# Patient Record
Sex: Male | Born: 1945
Health system: Southern US, Community
[De-identification: ages and names within clinical notes are randomized; demographics above are authoritative.]

## PROBLEM LIST (undated history)

## (undated) DIAGNOSIS — J84112 Idiopathic pulmonary fibrosis: Secondary | ICD-10-CM

## (undated) DIAGNOSIS — R739 Hyperglycemia, unspecified: Secondary | ICD-10-CM

## (undated) DIAGNOSIS — J849 Interstitial pulmonary disease, unspecified: Secondary | ICD-10-CM

## (undated) DIAGNOSIS — J8410 Pulmonary fibrosis, unspecified: Secondary | ICD-10-CM

## (undated) DIAGNOSIS — K219 Gastro-esophageal reflux disease without esophagitis: Secondary | ICD-10-CM

## (undated) DIAGNOSIS — I10 Essential (primary) hypertension: Secondary | ICD-10-CM

## (undated) DIAGNOSIS — I2723 Pulmonary hypertension due to lung diseases and hypoxia: Secondary | ICD-10-CM

## (undated) HISTORY — DX: Pulmonary fibrosis, unspecified: J84.10

## (undated) HISTORY — DX: Idiopathic pulmonary fibrosis: J84.112

## (undated) HISTORY — DX: Pulmonary hypertension due to lung diseases and hypoxia: I27.23

## (undated) HISTORY — PX: HERNIA REPAIR: SHX51

## (undated) HISTORY — DX: Gastro-esophageal reflux disease without esophagitis: K21.9

## (undated) HISTORY — PX: EYE SURGERY: SHX253

## (undated) HISTORY — DX: Interstitial pulmonary disease, unspecified: J84.9

---

## 2016-05-03 ENCOUNTER — Emergency Department (HOSPITAL_COMMUNITY)
Admission: EM | Admit: 2016-05-03 | Discharge: 2016-05-03 | Disposition: A | Discharge status: Home / Self Care | Payer: Self-pay | Attending: Emergency Medicine | Admitting: Emergency Medicine

## 2016-05-03 ENCOUNTER — Encounter (HOSPITAL_COMMUNITY): Payer: Self-pay

## 2016-05-03 ENCOUNTER — Emergency Department (HOSPITAL_COMMUNITY): Payer: Self-pay

## 2016-05-03 DIAGNOSIS — R109 Unspecified abdominal pain: Secondary | ICD-10-CM | POA: Insufficient documentation

## 2016-05-03 DIAGNOSIS — Z87891 Personal history of nicotine dependence: Secondary | ICD-10-CM | POA: Insufficient documentation

## 2016-05-03 DIAGNOSIS — I10 Essential (primary) hypertension: Secondary | ICD-10-CM | POA: Insufficient documentation

## 2016-05-03 HISTORY — DX: Essential (primary) hypertension: I10

## 2016-05-03 LAB — COMPREHENSIVE METABOLIC PANEL
ALBUMIN: 3.6 g/dL (ref 3.5–5.0)
ALT: 11 U/L — ABNORMAL LOW (ref 17–63)
ANION GAP: 11 (ref 5–15)
AST: 19 U/L (ref 15–41)
Alkaline Phosphatase: 64 U/L (ref 38–126)
BILIRUBIN TOTAL: 0.7 mg/dL (ref 0.3–1.2)
BUN: 11 mg/dL (ref 6–20)
CO2: 30 mmol/L (ref 22–32)
Calcium: 9.7 mg/dL (ref 8.9–10.3)
Chloride: 97 mmol/L — ABNORMAL LOW (ref 101–111)
Creatinine, Ser: 0.99 mg/dL (ref 0.61–1.24)
GFR calc non Af Amer: >60 mL/min (ref >60)
Glucose, Bld: 130 mg/dL — ABNORMAL HIGH (ref 65–99)
POTASSIUM: 4.6 mmol/L (ref 3.5–5.1)
Sodium: 138 mmol/L (ref 135–145)
TOTAL PROTEIN: 8.3 g/dL — AB (ref 6.5–8.1)

## 2016-05-03 LAB — CBC
HEMATOCRIT: 42.3 % (ref 39.0–52.0)
HEMOGLOBIN: 14.3 g/dL (ref 13.0–17.0)
MCH: 31 pg (ref 26.0–34.0)
MCHC: 33.8 g/dL (ref 30.0–36.0)
MCV: 91.8 fL (ref 78.0–100.0)
Platelets: 317 10*3/uL (ref 150–400)
RBC: 4.61 MIL/uL (ref 4.22–5.81)
RDW: 13.2 % (ref 11.5–15.5)
WBC: 7.3 10*3/uL (ref 4.0–10.5)

## 2016-05-03 LAB — URINALYSIS, ROUTINE W REFLEX MICROSCOPIC
Bilirubin Urine: NEGATIVE
Glucose, UA: NEGATIVE mg/dL
Hgb urine dipstick: NEGATIVE
Ketones, ur: NEGATIVE mg/dL
LEUKOCYTES UA: NEGATIVE
NITRITE: NEGATIVE
PH: 7 (ref 5.0–8.0)
Protein, ur: NEGATIVE mg/dL
SPECIFIC GRAVITY, URINE: 1.017 (ref 1.005–1.030)

## 2016-05-03 LAB — LIPASE, BLOOD: Lipase: 14 U/L (ref 11–51)

## 2016-05-03 MED ORDER — HYDROCODONE-ACETAMINOPHEN 5-325 MG PO TABS: 1.0000 | ORAL_TABLET | Freq: Four times a day (QID) | ORAL | 0 refills | Status: DC | PRN

## 2016-05-03 MED ORDER — BENZONATATE 100 MG PO CAPS: 200.0000 mg | ORAL_CAPSULE | Freq: Two times a day (BID) | ORAL | 0 refills | Status: DC | PRN

## 2016-05-03 MED ORDER — CYCLOBENZAPRINE HCL 10 MG PO TABS: 10.0000 mg | ORAL_TABLET | Freq: Two times a day (BID) | ORAL | 0 refills | Status: DC | PRN

## 2016-05-03 NOTE — ED Provider Notes (Signed)
[facility] DEPT Provider Note   CSN: [id number] Arrival date & time: 05/03/16  1058     History   Chief Complaint Chief Complaint  Patient presents with  . Flank Pain    HPI [name] is a 71 y.o. male.  Patient presents to the ED with a chief complaint of left side pain.  Symptoms started on 8-10 days ago.  States that symptoms are worsened with palpation.  Denies fever, new cough, abdominal pain, dysuria.   Has been taking azithromycin for cough.  Denies any improvement of his symptoms.  Is scheduled to see pulmonology for fibrosis in 10 days.     The history is provided by the patient. The history is limited by a language barrier. A language interpreter was used.    Past Medical History:  Diagnosis Date  . Hypertension     There are no active problems to display for this patient.   Past Surgical History:  Procedure Laterality Date  . EYE SURGERY    . HERNIA REPAIR         Home Medications    Prior to Admission medications   Not on File    Family History No family history on file.  Social History Social History  Substance Use Topics  . Smoking status: Former Smoker    Quit date: 05/03/2001  . Smokeless tobacco: Never Used  . Alcohol use No     Allergies   Patient has no allergy information on record.   Review of Systems Review of Systems  All other systems reviewed and are negative.    Physical Exam Updated Vital Signs BP 112/75   Pulse 88   Temp 97.6 F (36.4 C) (Oral)   Resp 18   SpO2 99%   Physical Exam  Constitutional: He is oriented to person, place, and time. He appears well-developed and well-nourished.  HENT:  Head: Normocephalic and atraumatic.  Eyes: Conjunctivae and EOM are normal. Pupils are equal, round, and reactive to light. Right eye exhibits no discharge. Left eye exhibits no discharge. No scleral icterus.  Neck: Normal range of motion. Neck supple. No JVD present.  Cardiovascular: Normal rate, regular  rhythm and normal heart sounds.  Exam reveals no gallop and no friction rub.   No murmur heard. Pulmonary/Chest: Effort normal and breath sounds normal. No respiratory distress. He has no wheezes. He has no rales. He exhibits no tenderness.  Abdominal: Soft. He exhibits no distension and no mass. There is no tenderness. There is no rebound and no guarding.  No focal abdominal tenderness, no RLQ tenderness or pain at McBurney's point, no RUQ tenderness or Murphy's sign, no left-sided abdominal tenderness, no fluid wave, or signs of peritonitis   Musculoskeletal: Normal range of motion. He exhibits no edema or tenderness.  Left side is tender to palpation  Neurological: He is alert and oriented to person, place, and time.  Skin: Skin is warm and dry.  No rash or bruises  Psychiatric: He has a normal mood and affect. His behavior is normal. Judgment and thought content normal.  Nursing note and vitals reviewed.    ED Treatments / Results  Labs (all labs ordered are listed, but only abnormal results are displayed) Labs Reviewed  COMPREHENSIVE METABOLIC PANEL - Abnormal; Notable for the following:       Result Value   Chloride 97 (*)    Glucose, Bld 130 (*)    Total Protein 8.3 (*)    ALT 11 (*)  All other components within normal limits  LIPASE, BLOOD  CBC  URINALYSIS, ROUTINE W REFLEX MICROSCOPIC    EKG  EKG Interpretation None       Radiology Dg Chest 2 View  Result Date: 05/03/2016 CLINICAL DATA:  Cough EXAM: CHEST  2 VIEW COMPARISON:  None. FINDINGS: Chronic interstitial markings markings/fibrosis with bronchiectasis and subpleural reticulation in the lungs bilaterally. While nonspecific, idiopathic pulmonary fibrosis is possible. No superimposed opacities suspicious for pneumonia. No pleural effusion or pneumothorax. The heart is normal in size. Mild degenerative changes of the visualized thoracolumbar spine. Mild lower thoracic kyphotic deformity with associated mild  superior endplate changes with loss of height at L1. IMPRESSION: Chronic interstitial markings/fibrosis, nonspecific, but suggesting chronic interstitial lung disease such as idiopathic pulmonary fibrosis. No superimposed opacities suspicious for pneumonia. Electronically Signed   By: Julian Hy M.D.   On: 05/03/2016 12:38   Ct Renal Stone Study  Result Date: 05/03/2016 CLINICAL DATA:  LEFT lower quadrant pain radiating to LEFT flank for 8 days. EXAM: CT ABDOMEN AND PELVIS WITHOUT CONTRAST TECHNIQUE: Multidetector CT imaging of the abdomen and pelvis was performed following the standard protocol without IV contrast. COMPARISON:  None. FINDINGS: Moderate respiratory motion degraded examination. LOWER CHEST: Fibrotic changes lung bases without focal consolidation. Component honeycombing apparent. Heart size is normal. Subcentimeter lymph node adjacent to the thoracic esophagus. No pericardial effusions. The visualized heart size is normal. No pericardial effusion. HEPATOBILIARY: Normal. PANCREAS: Normal. SPLEEN: Normal. ADRENALS/URINARY TRACT: Kidneys are orthotopic, demonstrating normal size and morphology. No nephrolithiasis, hydronephrosis ; please note, degree of respiratory motion limits detection of small nonobstructing nephrolithiasis. Limited assessment for renal masses on this nonenhanced examination. The unopacified ureters are normal in course and caliber. Urinary bladder is partially distended and unremarkable. Normal adrenal glands. STOMACH/BOWEL: The stomach, small and large bowel are normal in course and caliber without inflammatory changes, sensitivity decreased by lack of enteric contrast. Stooling gas distended rectum. Normal appendix. VASCULAR/LYMPHATIC: 2.5 cm ectatic infrarenal aorta. Mild calcific atherosclerosis. No lymphadenopathy by CT size criteria. REPRODUCTIVE: Mild prostatomegaly involving base of the bladder. OTHER: No intraperitoneal free fluid or free air. MUSCULOSKELETAL:  Non-acute. Bridging sacroiliac osteophytes. Old LEFT L2 transverse process fracture. Old mild T12 compression fracture. IMPRESSION: No urolithiasis, obstructive uropathy nor acute intra-abdominal/pelvic process on this moderately motion degraded examination. Included chest demonstrates fibrosis and honeycombing associated with chronic interstitial lung disease. Recommend CT chest without contrast on a nonemergent basis. Electronically Signed   By: Elon Alas M.D.   On: 05/03/2016 13:53    Procedures Procedures (including critical care time)  Medications Ordered in ED Medications - No data to display   Initial Impression / Assessment and Plan / ED Course  I have reviewed the triage vital signs and the nursing notes.  Pertinent labs & imaging results that were available during my care of the patient were reviewed by me and considered in my medical decision making (see chart for details).     Patient with left side pain x a little over a week.  No fever.  No focal abdominal pain.  No new cough.  No evidence of superimposed infection on CXR.  Pain is very reproducible, and is worsened with flexion of abdominal wall.  Seems muscular.  Patient seen by and discussed with Dr. Gilford Raid, who agrees.  Will check CT imaging to rule out other causes, but suspect muscular strain.  CT is negative for acute findings.  Patient has follow-up regarding pulmonary fibrosis in 9 days.  Return precautions given.  Patient is stable and ready for discharge.  Final Clinical Impressions(s) / ED Diagnoses   Final diagnoses:  Flank pain    New Prescriptions New Prescriptions   BENZONATATE (TESSALON) 100 MG CAPSULE    Take 2 capsules (200 mg total) by mouth 2 (two) times daily as needed for cough.   CYCLOBENZAPRINE (FLEXERIL) 10 MG TABLET    Take 1 tablet (10 mg total) by mouth 2 (two) times daily as needed for muscle spasms.   HYDROCODONE-ACETAMINOPHEN (NORCO/VICODIN) 5-325 MG TABLET    Take 1-2 tablets  by mouth every 6 (six) hours as needed.     Montine Circle, PA-C 05/03/16 1421    Isla Pence, MD 05/03/16 1426

## 2016-05-03 NOTE — ED Triage Notes (Signed)
Pt reports LLQ abdominal pain that radiates to left flank area that started 8-10 days ago. Denies abnormal urinary symptoms. Pt just finished taking Azithromycin for "for infection where his pain is"  but has not felt better.

## 2016-05-03 NOTE — ED Notes (Signed)
ED Provider at bedside. 

## 2016-05-12 ENCOUNTER — Encounter: Payer: Self-pay | Admitting: Internal Medicine

## 2016-05-12 ENCOUNTER — Ambulatory Visit (INDEPENDENT_AMBULATORY_CARE_PROVIDER_SITE_OTHER): Payer: Self-pay | Admitting: Internal Medicine

## 2016-05-12 ENCOUNTER — Other Ambulatory Visit (INDEPENDENT_AMBULATORY_CARE_PROVIDER_SITE_OTHER): Payer: Self-pay

## 2016-05-12 DIAGNOSIS — R0789 Other chest pain: Secondary | ICD-10-CM

## 2016-05-12 DIAGNOSIS — R634 Abnormal weight loss: Secondary | ICD-10-CM

## 2016-05-12 DIAGNOSIS — R079 Chest pain, unspecified: Secondary | ICD-10-CM | POA: Insufficient documentation

## 2016-05-12 DIAGNOSIS — R0600 Dyspnea, unspecified: Secondary | ICD-10-CM

## 2016-05-12 DIAGNOSIS — R000 Tachycardia, unspecified: Secondary | ICD-10-CM | POA: Insufficient documentation

## 2016-05-12 DIAGNOSIS — J849 Interstitial pulmonary disease, unspecified: Secondary | ICD-10-CM

## 2016-05-12 DIAGNOSIS — R0602 Shortness of breath: Secondary | ICD-10-CM | POA: Insufficient documentation

## 2016-05-12 LAB — BASIC METABOLIC PANEL
BUN: 23 mg/dL (ref 6–23)
CO2: 31 meq/L (ref 19–32)
Calcium: 10.2 mg/dL (ref 8.4–10.5)
Chloride: 91 mEq/L — ABNORMAL LOW (ref 96–112)
Creatinine, Ser: 1.02 mg/dL (ref 0.40–1.50)
GFR: 76.86 mL/min (ref >60.00)
Glucose, Bld: 99 mg/dL (ref 70–99)
Potassium: 4.8 mEq/L (ref 3.5–5.1)
Sodium: 130 mEq/L — ABNORMAL LOW (ref 135–145)

## 2016-05-12 LAB — CARDIAC PANEL
CK TOTAL: 55 U/L (ref 7–232)
CK-MB: 1.2 ng/mL (ref 0.3–4.0)
Relative Index: 2.2 calc (ref 0.0–2.5)

## 2016-05-12 LAB — TROPONIN I: TNIDX: 0.01 ug/l (ref 0.00–0.06)

## 2016-05-12 LAB — SEDIMENTATION RATE: SED RATE: 95 mm/h — AB (ref 0–20)

## 2016-05-12 MED ORDER — PREDNISONE 10 MG PO TABS: ORAL_TABLET | ORAL | 0 refills | Status: DC

## 2016-05-12 MED ORDER — FLUTICASONE-UMECLIDIN-VILANT 100-62.5-25 MCG/INH IN AEPB: 1.0000 | INHALATION_SPRAY | Freq: Every day | RESPIRATORY_TRACT | 11 refills | Status: DC

## 2016-05-12 NOTE — Assessment & Plan Note (Signed)
Heart rate got very fast without you dropping oxygen. This is when you got short of breath and stopped during our walk test  Plan Get echo  Followup Next 2 weeks but after completing of all of above Return to see me or an APP

## 2016-05-12 NOTE — Progress Notes (Signed)
Subjective:    Patient ID: [name], male    DOB: Oct 16, 1945, 71 y.o.   MRN: [id number]   PCP No PCP Per Patient  HPI    IOV 05/12/2016  Chief Complaint  Patient presents with  . Pulmonary Consult    SOB more lately, doctor in [facility] gave pt [place] and he does take it everyday but no reflief    71 year old [place]. He has been living and [place] with his daughters Lenell Antu and Hallam since 2004. 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 He is been referred for interstitial lung disease.  History according to the [employer] of chest physicians interstitial lung disease questionnaire - filled in by daughters . PAtint speaks no english. Daughters limited english  He reports a two-month history of cough that is that on most days. It is severe and it interferes with his activity. It is a dry cough in quality. There is associated shortness of breath that is at least level II which is a gets short of breath when hurrying on level ground or walking up a slight hill. Symptoms are associated with 30 pound weight loss over the last 2-4 months. Also with dysphagia and heartburn and dry mouth. But he denies any rash or edema or sensitivity to light and wheezing or oral ulcers or chest pain [other than some left infra-axillary pain]   In terms of personal exposure history: He denies any street drug use. He used to smoke cigarettes started smoking at age 69 smoked until age56;  40 cigarettes per day.   Family history: He denies any emphysema, COPD asthma, sarcoidosis, cystic fibrosis, pulmonary fibrosis, hypersensitivity pneumonitis. He does not live in a old house. 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 They deny any work in a minor quarry  are all [employer] or a foundry   Pulmonary toxicity history: He denies. No radiation exposure.  He presented to urgent care and at [facility] 03/07/2016:I do not have the images with me. But I reviewed the chart. His creatinine was 1.0 mg percent. His CT is reported as severe interstitial fibrosis with peripheral honeycombing. No pleural or pericardial effusion. This is the wall distal esophagus and esophagitis. Negative upper abdomen. He did get IV contrast. No mass noted  Walking desaturation test 185 feet 3 laps on room air:   He walked only 1 lap and stopped due to shortness of breath. Did not desaturate. HR 131/min. Denied chest pain     has a past medical history of GERD (gastroesophageal reflux disease); Hypertension; and Pulmonary fibrosis ([place]).   reports that he quit smoking about 15 years ago. He has a 80.00 pack-year smoking history. He has never used smokeless tobacco.  Past Surgical History:  Procedure Laterality Date  . EYE SURGERY    . HERNIA REPAIR      Not on File   There is no immunization history on file for this patient.  No family history on file.   Current Outpatient Prescriptions:  .  baclofen (LIORESAL) 10 MG tablet, Take 10 mg by mouth 3 (three) times daily., Disp: , Rfl:  .  benzonatate (TESSALON) 100 MG capsule, Take 2 capsules (200 mg total) by mouth 2 (two) times daily as needed for cough., Disp: 20  capsule, Rfl: 0 .  Glycopyrrolate-Formoterol (BEVESPI AEROSPHERE) 9-4.8 MCG/ACT AERO, Inhale into the lungs., Disp: , Rfl:  .  HYDROcodone-acetaminophen (NORCO/VICODIN) 5-325 MG tablet, Take 1-2 tablets by mouth every 6 (six) hours as needed., Disp: 10 tablet, Rfl: 0 .  mirtazapine (REMERON) 15 MG tablet, Take 15 mg by mouth at bedtime., Disp: , Rfl:    Review of Systems  Constitutional: Negative for fever and unexpected weight change.  HENT: Negative for congestion, dental problem, ear pain, nosebleeds, postnasal drip, rhinorrhea, sinus pressure,  sneezing, sore throat and trouble swallowing.   Eyes: Negative for redness and itching.  Respiratory: Positive for cough and shortness of breath. Negative for chest tightness and wheezing.   Cardiovascular: Negative for palpitations and leg swelling.  Gastrointestinal: Negative for nausea and vomiting.  Genitourinary: Negative for dysuria.  Musculoskeletal: Negative for joint swelling.  Skin: Negative for rash.  Neurological: Negative for headaches.  Hematological: Does not bruise/bleed easily.  Psychiatric/Behavioral: Negative for dysphoric mood. The patient is not nervous/anxious.        Objective:   Physical Exam  Constitutional: He is oriented to person, place, and time. He appears well-developed and well-nourished. No distress.  lean  HENT:  Head: Normocephalic and atraumatic.  Right Ear: External ear normal.  Left Ear: External ear normal.  Mouth/Throat: Oropharynx is clear and moist. No oropharyngeal exudate.  Eyes: Conjunctivae and EOM are normal. Pupils are equal, round, and reactive to light. Right eye exhibits no discharge. Left eye exhibits no discharge. No scleral icterus.  Neck: Normal range of motion. Neck supple. No JVD present. No tracheal deviation present. No thyromegaly present.  Cardiovascular: Normal rate, regular rhythm and intact distal pulses.  Exam reveals no gallop and no friction rub.   No murmur heard. Pulmonary/Chest: Effort normal and breath sounds normal. No respiratory distress. He has no wheezes. He has no rales. He exhibits no tenderness.  tachypneic Some crackles No wheeze Mild acc muscle use  Abdominal: Soft. Bowel sounds are normal. He exhibits no distension and no mass. There is no tenderness. There is no rebound and no guarding.  Musculoskeletal: Normal range of motion. He exhibits no edema or tenderness.  Lymphadenopathy:    He has no cervical adenopathy.  Neurological: He is alert and oriented to person, place, and time. He has normal  reflexes. No cranial nerve deficit. Coordination normal.  Skin: Skin is warm and dry. No rash noted. He is not diaphoretic. No erythema. No pallor.  Psychiatric: He has a normal mood and affect. His behavior is normal. Judgment and thought content normal.  Nursing note and vitals reviewed.   Vitals:   05/12/16 1612  BP: 104/70  Pulse: (!) 118  SpO2: 96%  Weight: 109 lb (49.4 kg)    There is no height or weight on file to calculate BMI.        Assessment & Plan:  /ILD (interstitial lung disease) ([place])  - I am concerned you have Interstitial Lung Disease (ILD)  -  There are > 100 varieties of this - To narrow down possibilities and assess severity please do the following tests  - do Pre-bd spiro and dlco only. No lung volume or bd response.   - do overnight oxygen test on room air  - do High Resolution CT chest wo contrast (only Dr Rosario Jacks or Dr Weber Cooks or dr Polly Cobia to read)  - do autoimmune panel: Serum: ESR, ANA, DS-DNA, RF, anti-CCP, ssA, ssB, scl-70, ANCA, MPO and PR-3 antibodies, Total CK,  Aldolase,  Hypersensitivity Pneumonitis Panel   Weight loss, abnormal Not sure if this is related to pulmonary fibrosis Will see what workup shows and then decide next step  Atypical chest pain Not sure why you have this Get echo Let us see what other workup shos  Tachycardia Heart rate got very fast without you dropping oxygen. This is when you got short of breath and stopped during our walk test  Plan Get echo  Followup Next 2 weeks but after completing of all of above Return to see me or an APP  Dyspnea No real quick fix for shortness of breath without going through workup esp if BEVESPI did not work  Plan Go to ER if there is crisis For now try TRELEGY inhaler sample Try Take prednisone 40 mg daily x 2 days, then 72m daily x 2 days, then 136mdaily x 2 days, then 16m62maily x 2 days and stop     Off note: At first only his daughters were present. Later after going  through all the instructions of niece showed up who spoke fluent EngVanuatuterpreter for the family. During the course of creating the plan, express constant frustration. Initially they did not feel satisfied with the plan to work of interstitial lung disease and rule out cardiomyopathy with an echocardiogram. They felt that dyspnea needed to be addressed with the help of her medication. At this point in time the greater plan of giving empiric prednisone and a triple inhaler therapy made by [employer] (Bevspi did not help). And it seems satisfied. Later they felt that they would want to take all the outpatient lab work to the emergency room and have the emergency room performed all the tests. I did explain to them that the process did not look like this. I also exposed to them that there is any concern of deterioration that should straightaway go to the emergency room at any point in time. I did explain to them that we're working out for chronic problems but any acute decompensation at any time that should just go to the emergency room.because of frustration of the family I did add troponin and d-dimer check and chemistry panel to the blood work   Dr. MurBrand Males.D., F.CWayne County HospitalP Pulmonary and Critical Care Medicine Staff Physician [facility] Pager: [phone] no answer or between  15:00h - 7:00h: call [phone]  05/12/2016 4:51 PM

## 2016-05-12 NOTE — Assessment & Plan Note (Signed)
Not sure why you have this Get echo Let us see what other workup shos

## 2016-05-12 NOTE — Assessment & Plan Note (Signed)
Not sure if this is related to pulmonary fibrosis Will see what workup shows and then decide next step

## 2016-05-12 NOTE — Assessment & Plan Note (Signed)
-  I am concerned you have Interstitial Lung Disease (ILD)  -  There are > 100 varieties of this - To narrow down possibilities and assess severity please do the following tests  - do Pre-bd spiro and dlco only. No lung volume or bd response.   - do overnight oxygen test on room air  - do High Resolution CT chest wo contrast (only Dr Rosario Jacks or Dr Weber Cooks or dr Polly Cobia to read)  - do autoimmune panel: Serum: ESR, ANA, DS-DNA, RF, anti-CCP, ssA, ssB, scl-70, ANCA, MPO and PR-3 antibodies, Total CK,  Aldolase,  Hypersensitivity Pneumonitis Panel

## 2016-05-12 NOTE — Assessment & Plan Note (Signed)
No real quick fix for shortness of breath without going through workup esp if BEVESPI did not work  Plan Go to ER if there is crisis For now try TRELEGY inhaler sample Try Take prednisone 40 mg daily x 2 days, then 61m daily x 2 days, then 148mdaily x 2 days, then 29m13maily x 2 days and stop

## 2016-05-12 NOTE — Patient Instructions (Addendum)
ILD (interstitial lung disease) ([place])  - I am concerned you have Interstitial Lung Disease (ILD)  -  There are > 100 varieties of this - To narrow down possibilities and assess severity please do the following tests  - do Pre-bd spiro and dlco only. No lung volume or bd response.   - do overnight oxygen test on room air  - do High Resolution CT chest wo contrast (only Dr Rosario Jacks or Dr Weber Cooks or dr Polly Cobia to read)  - do autoimmune panel: Serum: ESR, ANA, DS-DNA, RF, anti-CCP, ssA, ssB, scl-70, ANCA, MPO and PR-3 antibodies, Total CK,  Aldolase,  Hypersensitivity Pneumonitis Panel   Weight loss, abnormal Not sure if this is related to pulmonary fibrosis Will see what workup shows and then decide next step  Atypical chest pain Not sure why you have this Get echo Let us see what other workup shos  Tachycardia Heart rate got very fast without you dropping oxygen. This is when you got short of breath and stopped during our walk test  Plan Get echo  Followup Next 2 weeks but after completing of all of above Return to see me or an APP  Dyspnea No real quick fix for shortness of breath without going through workup esp if BEVESPI did not work  Plan Go to ER if there is crisis For now try TRELEGY inhaler sample Try Take prednisone 40 mg daily x 2 days, then 77m daily x 2 days, then 180mdaily x 2 days, then 25m77maily x 2 days and stop

## 2016-05-13 ENCOUNTER — Inpatient Hospital Stay (HOSPITAL_COMMUNITY)
Admission: EM | Admit: 2016-05-13 | Discharge: 2016-05-17 | DRG: 196 | Disposition: A | Discharge status: Home / Self Care | Payer: Medicaid Other | Attending: Family Medicine | Admitting: Family Medicine

## 2016-05-13 ENCOUNTER — Encounter (HOSPITAL_COMMUNITY): Payer: Self-pay | Admitting: Nurse Practitioner

## 2016-05-13 ENCOUNTER — Emergency Department (HOSPITAL_COMMUNITY): Payer: Medicaid Other

## 2016-05-13 DIAGNOSIS — T380X5A Adverse effect of glucocorticoids and synthetic analogues, initial encounter: Secondary | ICD-10-CM | POA: Diagnosis present

## 2016-05-13 DIAGNOSIS — K219 Gastro-esophageal reflux disease without esophagitis: Secondary | ICD-10-CM | POA: Diagnosis present

## 2016-05-13 DIAGNOSIS — Z9981 Dependence on supplemental oxygen: Secondary | ICD-10-CM

## 2016-05-13 DIAGNOSIS — D638 Anemia in other chronic diseases classified elsewhere: Secondary | ICD-10-CM | POA: Diagnosis present

## 2016-05-13 DIAGNOSIS — E43 Unspecified severe protein-calorie malnutrition: Secondary | ICD-10-CM | POA: Insufficient documentation

## 2016-05-13 DIAGNOSIS — J189 Pneumonia, unspecified organism: Secondary | ICD-10-CM

## 2016-05-13 DIAGNOSIS — J849 Interstitial pulmonary disease, unspecified: Secondary | ICD-10-CM | POA: Diagnosis present

## 2016-05-13 DIAGNOSIS — R000 Tachycardia, unspecified: Secondary | ICD-10-CM | POA: Diagnosis present

## 2016-05-13 DIAGNOSIS — Z87891 Personal history of nicotine dependence: Secondary | ICD-10-CM

## 2016-05-13 DIAGNOSIS — J982 Interstitial emphysema: Secondary | ICD-10-CM

## 2016-05-13 DIAGNOSIS — I10 Essential (primary) hypertension: Secondary | ICD-10-CM | POA: Diagnosis present

## 2016-05-13 DIAGNOSIS — J8489 Other specified interstitial pulmonary diseases: Principal | ICD-10-CM | POA: Diagnosis present

## 2016-05-13 DIAGNOSIS — J984 Other disorders of lung: Secondary | ICD-10-CM

## 2016-05-13 DIAGNOSIS — R071 Chest pain on breathing: Secondary | ICD-10-CM

## 2016-05-13 DIAGNOSIS — R079 Chest pain, unspecified: Secondary | ICD-10-CM

## 2016-05-13 DIAGNOSIS — R739 Hyperglycemia, unspecified: Secondary | ICD-10-CM | POA: Diagnosis present

## 2016-05-13 DIAGNOSIS — Z681 Body mass index (BMI) 19 or less, adult: Secondary | ICD-10-CM

## 2016-05-13 DIAGNOSIS — R0789 Other chest pain: Secondary | ICD-10-CM | POA: Diagnosis present

## 2016-05-13 DIAGNOSIS — J9601 Acute respiratory failure with hypoxia: Secondary | ICD-10-CM | POA: Diagnosis present

## 2016-05-13 DIAGNOSIS — Z79899 Other long term (current) drug therapy: Secondary | ICD-10-CM

## 2016-05-13 LAB — BASIC METABOLIC PANEL
Anion gap: 11 (ref 5–15)
BUN: 14 mg/dL (ref 6–20)
CHLORIDE: 96 mmol/L — AB (ref 101–111)
CO2: 23 mmol/L (ref 22–32)
CREATININE: 0.98 mg/dL (ref 0.61–1.24)
Calcium: 9 mg/dL (ref 8.9–10.3)
GFR calc non Af Amer: >60 mL/min (ref >60)
GLUCOSE: 123 mg/dL — AB (ref 65–99)
Potassium: 4.4 mmol/L (ref 3.5–5.1)
Sodium: 130 mmol/L — ABNORMAL LOW (ref 135–145)

## 2016-05-13 LAB — I-STAT ARTERIAL BLOOD GAS, ED
Bicarbonate: 25.2 mmol/L (ref 20.0–28.0)
O2 SAT: 95 %
PCO2 ART: 42.3 mmHg (ref 32.0–48.0)
TCO2: 26 mmol/L (ref 0–100)
pH, Arterial: 7.383 (ref 7.350–7.450)
pO2, Arterial: 75 mmHg — ABNORMAL LOW (ref 83.0–108.0)

## 2016-05-13 LAB — CBC
HEMATOCRIT: 41 % (ref 39.0–52.0)
Hemoglobin: 14 g/dL (ref 13.0–17.0)
MCH: 30.9 pg (ref 26.0–34.0)
MCHC: 34.1 g/dL (ref 30.0–36.0)
MCV: 90.5 fL (ref 78.0–100.0)
Platelets: 329 10*3/uL (ref 150–400)
RBC: 4.53 MIL/uL (ref 4.22–5.81)
RDW: 12.7 % (ref 11.5–15.5)
WBC: 6.9 10*3/uL (ref 4.0–10.5)

## 2016-05-13 LAB — BRAIN NATRIURETIC PEPTIDE: B NATRIURETIC PEPTIDE 5: 7.5 pg/mL (ref 0.0–100.0)

## 2016-05-13 LAB — I-STAT TROPONIN, ED: TROPONIN I, POC: 0 ng/mL (ref 0.00–0.08)

## 2016-05-13 MED ORDER — ENOXAPARIN SODIUM 40 MG/0.4ML ~~LOC~~ SOLN
40.0000 mg | SUBCUTANEOUS | Status: DC
  Administered 2016-05-13 – 2016-05-16 (×4): 40 mg via SUBCUTANEOUS
  Filled 2016-05-13 (×4): qty 0.4

## 2016-05-13 MED ORDER — IPRATROPIUM-ALBUTEROL 0.5-2.5 (3) MG/3ML IN SOLN
3.0000 mL | Freq: Four times a day (QID) | RESPIRATORY_TRACT | Status: DC
  Administered 2016-05-14: 3 mL via RESPIRATORY_TRACT
  Filled 2016-05-13: qty 3

## 2016-05-13 MED ORDER — HYDROCODONE-ACETAMINOPHEN 5-325 MG PO TABS
1.0000 | ORAL_TABLET | Freq: Four times a day (QID) | ORAL | Status: DC | PRN
Start: 2016-05-13 — End: 2016-05-17
  Filled 2016-05-13: qty 1

## 2016-05-13 MED ORDER — ONDANSETRON HCL 4 MG/2ML IJ SOLN: 4.0000 mg | Freq: Four times a day (QID) | INTRAMUSCULAR | Status: DC | PRN

## 2016-05-13 MED ORDER — IPRATROPIUM-ALBUTEROL 0.5-2.5 (3) MG/3ML IN SOLN: 3.0000 mL | Freq: Four times a day (QID) | RESPIRATORY_TRACT | Status: DC

## 2016-05-13 MED ORDER — SODIUM CHLORIDE 0.9 % IV BOLUS (SEPSIS)
1000.0000 mL | Freq: Once | INTRAVENOUS | Status: AC
  Administered 2016-05-13: 1000 mL via INTRAVENOUS

## 2016-05-13 MED ORDER — IPRATROPIUM-ALBUTEROL 0.5-2.5 (3) MG/3ML IN SOLN
3.0000 mL | Freq: Once | RESPIRATORY_TRACT | Status: AC
  Administered 2016-05-13: 3 mL via RESPIRATORY_TRACT
  Filled 2016-05-13: qty 3

## 2016-05-13 MED ORDER — ACETAMINOPHEN 650 MG RE SUPP: 650.0000 mg | Freq: Four times a day (QID) | RECTAL | Status: DC | PRN

## 2016-05-13 MED ORDER — IOPAMIDOL (ISOVUE-370) INJECTION 76%
INTRAVENOUS | Status: AC
  Administered 2016-05-13: 100 mL
  Filled 2016-05-13: qty 100

## 2016-05-13 MED ORDER — METHYLPREDNISOLONE SODIUM SUCC 125 MG IJ SOLR
125.0000 mg | Freq: Once | INTRAMUSCULAR | Status: AC
  Administered 2016-05-13: 125 mg via INTRAVENOUS
  Filled 2016-05-13: qty 2

## 2016-05-13 MED ORDER — ONDANSETRON HCL 4 MG PO TABS: 4.0000 mg | ORAL_TABLET | Freq: Four times a day (QID) | ORAL | Status: DC | PRN

## 2016-05-13 MED ORDER — METHYLPREDNISOLONE SODIUM SUCC 125 MG IJ SOLR
60.0000 mg | Freq: Four times a day (QID) | INTRAMUSCULAR | Status: DC
  Administered 2016-05-13 – 2016-05-14 (×3): 60 mg via INTRAVENOUS
  Filled 2016-05-13 (×3): qty 2

## 2016-05-13 MED ORDER — SODIUM CHLORIDE 0.9 % IV SOLN
INTRAVENOUS | Status: DC
  Administered 2016-05-13: 23:00:00 via INTRAVENOUS
  Administered 2016-05-14: 1000 mL via INTRAVENOUS

## 2016-05-13 MED ORDER — ACETAMINOPHEN 325 MG PO TABS: 650.0000 mg | ORAL_TABLET | Freq: Four times a day (QID) | ORAL | Status: DC | PRN

## 2016-05-13 NOTE — ED Notes (Signed)
Patient transported to CT 

## 2016-05-13 NOTE — H&P (Signed)
TRH H&P    Patient Demographics:    [name], is a 71 y.o. male  MRN: [id number]  DOB - 01/14/1946  Admit Date - 05/13/2016  Referring MD/NP/PA: Dr. Ellender Hose  Outpatient Primary MD for the patient is No PCP Per Patient  Patient coming from: Home  Chief Complaint  Patient presents with  . Shortness of Breath      HPI:    [name]  is a 71 y.o. male, With history of pulmonary fibrosis,  hypertension came to hospital with worsening shortness of breath. Patient was recently seen by pulmonary as outpatient for worsening dyspnea. Lab work was done on 05/12/2016 for assessing the severity of interstitial lung disease. As per patient's son usually patient is able to walk around the house but over past 1 day he has not been able to walk at all and is getting short of breath when he tries to get out of bed. He also complains of sharp pleuritic left-sided chest pain that is worse with movement and and palpation. He denies nausea vomiting or diarrhea. He denies fever, no dysuria.  In the ED CTA was done which was negative for pulmonary embolism. It also showed small pneumomediastinum potentially due to rupture of the peripheral left sacral bleb. No evidence of fluid in the mediastinum. ED physician discussed with [facility], and they recommended breathing treatments, steroids and no further treatment for small pneumomediastinum.    Review of systems:      A full 10 point Review of Systems was done, except as stated above, all other Review of Systems were negative.   With Past History of the following :    Past Medical History:  Diagnosis Date  . GERD (gastroesophageal reflux disease)   . Hypertension   . Pulmonary fibrosis ([place])       Past Surgical History:  Procedure Laterality Date  . EYE SURGERY    . HERNIA REPAIR        Social History:      Social History  Substance Use Topics    . Smoking status: Former Smoker    Packs/day: 2.00    Years: 40.00    Quit date: 05/03/2001  . Smokeless tobacco: Never Used  . Alcohol use No       Family History :    History reviewed. No pertinent family history.    Home Medications:   Prior to Admission medications   Medication Sig Start Date End Date Taking? Authorizing Provider  Fluticasone-Umeclidin-Vilant (TRELEGY ELLIPTA) 100-62.5-25 MCG/INH AEPB Inhale 1 puff into the lungs daily. 05/12/16  Yes Brand Males, MD  Glycopyrrolate-Formoterol (BEVESPI AEROSPHERE) 9-4.8 MCG/ACT AERO Inhale 2 puffs into the lungs 2 (two) times daily.    Yes Historical Provider, MD  losartan (COZAAR) 25 MG tablet Take 25 mg by mouth daily.   Yes Historical Provider, MD  predniSONE (DELTASONE) 10 MG tablet 40 mg daily x 2 days, then 12m daily x 2 days, then 150mdaily x 2 days, then 25m36maily x 2 days and stop Patient taking differently: Take  5-10 mg by mouth See admin instructions. Tapered course started 05/13/16:  Take 1 tablet (10 mg) by mouth 4 times daily (every 6 hours) for 2 days, then take 1 tablet 2 times daily for 2 days, then take 1 tablet daily for 2 days, then take 1/2 tablet (5 mg) daily for 2 days, then stop 05/12/16  Yes Brand Males, MD  benzonatate (TESSALON) 100 MG capsule Take 2 capsules (200 mg total) by mouth 2 (two) times daily as needed for cough. Patient not taking: Reported on 05/13/2016 05/03/16   Montine Circle, PA-C  HYDROcodone-acetaminophen (NORCO/VICODIN) 5-325 MG tablet Take 1-2 tablets by mouth every 6 (six) hours as needed. Patient not taking: Reported on 05/13/2016 05/03/16   Montine Circle, PA-C     Allergies:     Allergies  Allergen Reactions  . Cyclobenzaprine Swelling    Tongue and lip swelling     Physical Exam:   Vitals  Blood pressure 98/79, pulse (!) 103, temperature 97.7 F (36.5 C), temperature source Axillary, resp. rate (!) 35, SpO2 100 %.  1.  General: Appears in no acute  distress  2. Psychiatric:  Intact judgement and  insight, awake alert, oriented x 3.  3. Neurologic: No focal neurological deficits, all cranial nerves intact.Strength 5/5 all 4 extremities, sensation intact all 4 extremities, plantars down going.  4. Eyes :  anicteric sclerae, moist conjunctivae with no lid lag. PERRLA.  5. ENMT:  Oropharynx clear with moist mucous membranes and good dentition  6. Neck:  supple, no cervical lymphadenopathy appriciated, No thyromegaly  7. Respiratory : Normal respiratory effort, bibasilar inspiratory crackles  8. Cardiovascular : RRR, no gallops, rubs or murmurs, no leg edema. Left-sided chest pain, left chest wall tenderness on palpation.  9. Gastrointestinal:  Positive bowel sounds, abdomen soft, non-tender to palpation,no hepatosplenomegaly, no rigidity or guarding       10. Skin:  No cyanosis, normal texture and turgor, no rash, lesions or ulcers  11.Musculoskeletal:  Good muscle tone,  joints appear normal , no effusions,  normal range of motion    Data Review:    CBC  Recent Labs Lab 05/13/16 1327  WBC 6.9  HGB 14.0  HCT 41.0  PLT 329  MCV 90.5  MCH 30.9  MCHC 34.1  RDW 12.7   ------------------------------------------------------------------------------------------------------------------  Chemistries   Recent Labs Lab 05/12/16 1726 05/13/16 1327  NA 130* 130*  K 4.8 4.4  CL 91* 96*  CO2 31 23  GLUCOSE 99 123*  BUN 23 14  CREATININE 1.02 0.98  CALCIUM 10.2 9.0   ------------------------------------------------------------------------------------------------------------------  ---------------------------------------------------------------------------------------Cardiac Enzymes:  Recent Labs Lab 05/12/16 1726  CKTOTAL 55  CKMB 1.2    --------------------------------------------------------------------------------------------------------------- Urine analysis:    Component Value Date/Time    COLORURINE YELLOW 05/03/2016 1129   APPEARANCEUR CLEAR 05/03/2016 1129   LABSPEC 1.017 05/03/2016 1129   PHURINE 7.0 05/03/2016 1129   GLUCOSEU NEGATIVE 05/03/2016 1129   HGBUR NEGATIVE 05/03/2016 1129   BILIRUBINUR NEGATIVE 05/03/2016 1129   KETONESUR NEGATIVE 05/03/2016 1129   PROTEINUR NEGATIVE 05/03/2016 1129   NITRITE NEGATIVE 05/03/2016 1129   LEUKOCYTESUR NEGATIVE 05/03/2016 1129      Imaging Results:    Ct Angio Chest Pe W Or Wo Contrast  Result Date: 05/13/2016 CLINICAL DATA:  Worsening shortness of breath. EXAM: CT ANGIOGRAPHY CHEST WITH CONTRAST TECHNIQUE: Multidetector CT imaging of the chest was performed using the standard protocol during bolus administration of intravenous contrast. Multiplanar CT image reconstructions and MIPs were obtained to evaluate  the vascular anatomy. CONTRAST:  80 cc Isovue 370 COMPARISON:  Chest x-ray 05/03/2016 FINDINGS: Cardiovascular: Heart size is upper normal. No pericardial effusion. No thoracic aortic aneurysm. No evidence for dissection of the thoracic aorta. No filling defects in the opacified pulmonary arteries to suggest the presence of acute pulmonary embolus. Mediastinum/Nodes: 13 mm short axis subcarinal lymph node is associated with a 12 mm short axis low paraesophageal lymph node , 12 mm short axis left hilar lymph node and 9 mm short axis right hilar lymph node. The esophagus has normal imaging features. Trace volume of pneumomediastinum is identified in the upper chest near the thoracic inlet (see images 23 - 32 of series 407)). Lungs/Pleura: Biapical pleural-parenchymal scarring is identified. Chronic underlying interstitial lung disease evident with prominent subpleural honeycombing bilaterally. No pleural effusion. Small areas of confluent airspace disease are noted bilaterally and may be related to active inflammation or infection. Upper Abdomen: Unremarkable. Musculoskeletal: Bone windows reveal no worrisome lytic or sclerotic osseous  lesions. Review of the MIP images confirms the above findings. IMPRESSION: 1. 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 2. Small volume pneumomediastinum, potentially related to rupture of a peripheral air sac or bleb. No evidence for fluid in the mediastinum, but correlation for signs/symptoms of esophageal tear suggested. 3. Mediastinal and hilar lymphadenopathy, likely reactive. 4. Scattered small areas of focal confluent airspace disease likely related to the underlying fibrotic process. Airspace infection cannot be entirely excluded. Electronically Signed   By: Misty Stanley M.D.   On: 05/13/2016 16:20    My personal review of EKG: Sinus tachycardia   Assessment & Plan:    Active Problems:   Interstitial lung disease ([place])   1. Dyspnea on exertion- secondary to exacerbation of underlying pulmonary fibrosis, will start Solu-Medrol 60 mg IV every 6 hours, DuoNeb nebs nebulizers every 6 hours. 2. Hypertension- will hold Cozaar as blood pressure is in the low 100s. We'll start normal saline at 75 ML per hour 3. Small pneumomediastinum- CTA shows small pneumomediastinum likely from rupture of the bleb, pulmonary was consulted by ED physician. No further intervention needed at this time, they are available to consult if needed. Continue Solu-Medrol as above. 4. Left-sided chest pain- likely muscular from coughing, reproducible on palpation CT negative for PE will start Vicodin 1 tablet by mouth every 6 hours when necessary for pain. Will cycle troponin every 6 hours 3   DVT Prophylaxis-   Lovenox  AM Labs Ordered, also please review Full Orders  Family Communication: Admission, patients condition and plan of care including tests being ordered have been discussed with the patient and His son at bedside who indicate understanding and agree with the plan and Code  Status.  Code Status:  Full code  Admission status: Observation    Time spent in minutes : 60 minutes   Maricela Kawahara S M.D on 05/13/2016 at [place] PM  Between 7am to 7pm - Pager - [phone]. After 7pm go to [email or web address] - password [facility]  [facility] - Office  [phone]

## 2016-05-13 NOTE — ED Triage Notes (Signed)
Pt presents with c/o SOB. The SOB began about 6-8 months ago. The SOB has been worse over past several weeks and is severe today. He is  currently being worked up by [facility] pulmonology for interstitial lung disease but can not wait until his next scheduled appointment because his symptoms are so severe. He feels sob even at rest todayt. Denies any chest pain. Reports decreased appetite, tachycardia.

## 2016-05-13 NOTE — ED Notes (Signed)
Attempted report x1.  Name and callback number provided.

## 2016-05-13 NOTE — ED Provider Notes (Signed)
[facility] DEPT Provider Note   CSN: [id number] Arrival date & time: 05/13/16  1310     History   Chief Complaint Chief Complaint  Patient presents with  . Shortness of Breath    HPI [name] is a 71 y.o. male.  HPI   71 yo M with h/o ILD, pulmonary fibrosis currently being worked up (just diagnosed in last several months) here with acute on chronic SOB. Pt is currently being up with [facility] pulm for his worsening dyspnea. Over the past 24 hours, he has had progressively worsening severe dyspnea. He is normally able to walk around his house but over the past day, he can no longer even get out of bed due to severe SOB. He has an associated sharp, pleuritic, left-sided chest pain that is worse with movement and palpation. He has had a dry cough without production or hemoptysis. No LE edema. No CP at rest. He has not had much of an appetite and reports that he has lost >40 lb in the past several months. No fevers. No recent sick contacts.  Past Medical History:  Diagnosis Date  . GERD (gastroesophageal reflux disease)   . Hypertension   . Pulmonary fibrosis [facility])     Patient Active Problem List   Diagnosis Date Noted  . Interstitial lung disease ([place]) 05/13/2016  . ILD (interstitial lung disease) ([place]) 05/12/2016  . Weight loss, abnormal 05/12/2016  . Atypical chest pain 05/12/2016  . Tachycardia 05/12/2016  . Dyspnea 05/12/2016    Past Surgical History:  Procedure Laterality Date  . EYE SURGERY    . HERNIA REPAIR         Home Medications    Prior to Admission medications   Medication Sig Start Date End Date Taking? Authorizing Provider  Fluticasone-Umeclidin-Vilant (TRELEGY ELLIPTA) 100-62.5-25 MCG/INH AEPB Inhale 1 puff into the lungs daily. 05/12/16  Yes Brand Males, MD  Glycopyrrolate-Formoterol (BEVESPI AEROSPHERE) 9-4.8 MCG/ACT AERO Inhale 2 puffs into the lungs 2 (two) times daily.    Yes Historical Provider, MD  losartan (COZAAR) 25 MG tablet  Take 25 mg by mouth daily.   Yes Historical Provider, MD  predniSONE (DELTASONE) 10 MG tablet 40 mg daily x 2 days, then 92m daily x 2 days, then 12mdaily x 2 days, then 12m63maily x 2 days and stop Patient taking differently: Take 5-10 mg by mouth See admin instructions. Tapered course started 05/13/16:  Take 1 tablet (10 mg) by mouth 4 times daily (every 6 hours) for 2 days, then take 1 tablet 2 times daily for 2 days, then take 1 tablet daily for 2 days, then take 1/2 tablet (5 mg) daily for 2 days, then stop 05/12/16  Yes MurBrand MalesD  benzonatate (TESSALON) 100 MG capsule Take 2 capsules (200 mg total) by mouth 2 (two) times daily as needed for cough. Patient not taking: Reported on 05/13/2016 05/03/16   RobMontine CircleA-C  HYDROcodone-acetaminophen (NORCO/VICODIN) 5-325 MG tablet Take 1-2 tablets by mouth every 6 (six) hours as needed. Patient not taking: Reported on 05/13/2016 05/03/16   RobMontine CircleA-C    Family History History reviewed. No pertinent family history.  Social History Social History  Substance Use Topics  . Smoking status: Former Smoker    Packs/day: 2.00    Years: 40.00    Quit date: 05/03/2001  . Smokeless tobacco: Never Used  . Alcohol use No     Allergies   Cyclobenzaprine   Review of Systems Review of Systems  Constitutional: Positive for appetite change, fatigue and unexpected weight change. Negative for chills and fever.  HENT: Negative for congestion and rhinorrhea.   Eyes: Negative for visual disturbance.  Respiratory: Positive for cough, shortness of breath and wheezing.   Cardiovascular: Negative for chest pain and leg swelling.  Gastrointestinal: Negative for abdominal pain, diarrhea, nausea and vomiting.  Genitourinary: Negative for dysuria and flank pain.  Musculoskeletal: Negative for neck pain and neck stiffness.  Skin: Negative for rash and wound.  Allergic/Immunologic: Negative for immunocompromised state.  Neurological:  Positive for weakness. Negative for syncope and headaches.  All other systems reviewed and are negative.    Physical Exam Updated Vital Signs BP 112/72   Pulse (!) 112   Temp 97.7 F (36.5 C) (Axillary) Comment (Src): u/a to obtain oral reading  Resp (!) 28   SpO2 100%   Physical Exam  Constitutional: He is oriented to person, place, and time. He appears well-developed. No distress.  Thin, malnourished, fatigued-appearing  HENT:  Head: Normocephalic and atraumatic.  Mildly dry MM  Eyes: Conjunctivae are normal.  Neck: Neck supple.  Cardiovascular: Regular rhythm and normal heart sounds.  Tachycardia present.  Exam reveals no friction rub.   No murmur heard. Pulmonary/Chest: Effort normal. Tachypnea noted. No respiratory distress. He has decreased breath sounds. He has wheezes. He has rales in the right upper field, the right middle field, the right lower field, the left upper field, the left middle field and the left lower field.  Abdominal: He exhibits no distension.  Musculoskeletal: He exhibits no edema.  Neurological: He is alert and oriented to person, place, and time. He exhibits normal muscle tone.  Skin: Skin is warm. Capillary refill takes less than 2 seconds.  Psychiatric: He has a normal mood and affect.  Nursing note and vitals reviewed.     ED Treatments / Results  Labs (all labs ordered are listed, but only abnormal results are displayed) Labs Reviewed  BASIC METABOLIC PANEL - Abnormal; Notable for the following:       Result Value   Sodium 130 (*)    Chloride 96 (*)    Glucose, Bld 123 (*)    All other components within normal limits  I-STAT ARTERIAL BLOOD GAS, ED - Abnormal; Notable for the following:    pO2, Arterial 75.0 (*)    All other components within normal limits  CBC  BRAIN NATRIURETIC PEPTIDE  I-STAT TROPOININ, ED    EKG  EKG Interpretation  Date/Time:  Saturday May 13 2016 13:16:21 EDT Ventricular Rate:  122 PR Interval:  122 QRS  Duration: 78 QT Interval:  308 QTC Calculation: 438 R Axis:   68 Text Interpretation:  Sinus tachycardia Possible Left atrial enlargement Nonspecific ST and T wave abnormality Abnormal ECG Confirmed by COOK  MD, BRIAN ([id number]) on 05/13/2016 2:23:10 PM       Radiology Ct Angio Chest Pe W Or Wo Contrast  Result Date: 05/13/2016 CLINICAL DATA:  Worsening shortness of breath. EXAM: CT ANGIOGRAPHY CHEST WITH CONTRAST TECHNIQUE: Multidetector CT imaging of the chest was performed using the standard protocol during bolus administration of intravenous contrast. Multiplanar CT image reconstructions and MIPs were obtained to evaluate the vascular anatomy. CONTRAST:  80 cc Isovue 370 COMPARISON:  Chest x-ray 05/03/2016 FINDINGS: Cardiovascular: Heart size is upper normal. No pericardial effusion. No thoracic aortic aneurysm. No evidence for dissection of the thoracic aorta. No filling defects in the opacified pulmonary arteries to suggest the presence of acute pulmonary embolus. Mediastinum/Nodes:  13 mm short axis subcarinal lymph node is associated with a 12 mm short axis low paraesophageal lymph node , 12 mm short axis left hilar lymph node and 9 mm short axis right hilar lymph node. The esophagus has normal imaging features. Trace volume of pneumomediastinum is identified in the upper chest near the thoracic inlet (see images 23 - 32 of series 407)). Lungs/Pleura: Biapical pleural-parenchymal scarring is identified. Chronic underlying interstitial lung disease evident with prominent subpleural honeycombing bilaterally. No pleural effusion. Small areas of confluent airspace disease are noted bilaterally and may be related to active inflammation or infection. Upper Abdomen: Unremarkable. Musculoskeletal: Bone windows reveal no worrisome lytic or sclerotic osseous lesions. Review of the MIP images confirms the above findings. IMPRESSION: 1. 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 2. Small volume pneumomediastinum, potentially related to rupture of a peripheral air sac or bleb. No evidence for fluid in the mediastinum, but correlation for signs/symptoms of esophageal tear suggested. 3. Mediastinal and hilar lymphadenopathy, likely reactive. 4. Scattered small areas of focal confluent airspace disease likely related to the underlying fibrotic process. Airspace infection cannot be entirely excluded. Electronically Signed   By: Misty Stanley M.D.   On: 05/13/2016 16:20    Procedures Procedures (including critical care time)  Medications Ordered in ED Medications  sodium chloride 0.9 % bolus 1,000 mL (0 mLs Intravenous Stopped 05/13/16 1705)  methylPREDNISolone sodium succinate (SOLU-MEDROL) 125 mg/2 mL injection 125 mg (125 mg Intravenous Given 05/13/16 1525)  ipratropium-albuterol (DUONEB) 0.5-2.5 (3) MG/3ML nebulizer solution 3 mL (3 mLs Nebulization Given 05/13/16 1525)  iopamidol (ISOVUE-370) 76 % injection (100 mLs  Contrast Given 05/13/16 1549)  sodium chloride 0.9 % bolus 1,000 mL (0 mLs Intravenous Stopped 05/13/16 1847)  ipratropium-albuterol (DUONEB) 0.5-2.5 (3) MG/3ML nebulizer solution 3 mL (3 mLs Nebulization Given 05/13/16 1809)     Initial Impression / Assessment and Plan / ED Course  I have reviewed the triage vital signs and the nursing notes.  Pertinent labs & imaging results that were available during my care of the patient were reviewed by me and considered in my medical decision making (see chart for details).     71 yo M with h/o recently diagnosed ILD here with acute on chronic SOB x 24 hours. Labs show normal WBC, baseline normal renal function. No hypoxia but pt markedly tachypneic and tachycardic with any movement. He does also c/o some pleuritic CP - c/f pleurisy, possibly 2/2 PE. CT Angio PE ordered.  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 Discussed with Rafael Bihari - will admit to hospitalist for steroids, breathing tx, fluids and montioring. Pulm available for consult if needed.  Final Clinical Impressions(s) / ED Diagnoses   Final diagnoses:  Chest pain  ILD (interstitial lung disease) ([place])  Pneumomediastinum ([facility])     Duffy Bruce, MD 05/13/16 Einar Crow

## 2016-05-14 ENCOUNTER — Encounter (HOSPITAL_COMMUNITY): Payer: Self-pay | Admitting: *Deleted

## 2016-05-14 DIAGNOSIS — R000 Tachycardia, unspecified: Secondary | ICD-10-CM | POA: Diagnosis present

## 2016-05-14 DIAGNOSIS — D638 Anemia in other chronic diseases classified elsewhere: Secondary | ICD-10-CM | POA: Diagnosis present

## 2016-05-14 DIAGNOSIS — R0789 Other chest pain: Secondary | ICD-10-CM

## 2016-05-14 DIAGNOSIS — Z87891 Personal history of nicotine dependence: Secondary | ICD-10-CM | POA: Diagnosis not present

## 2016-05-14 DIAGNOSIS — J849 Interstitial pulmonary disease, unspecified: Secondary | ICD-10-CM

## 2016-05-14 DIAGNOSIS — Z9981 Dependence on supplemental oxygen: Secondary | ICD-10-CM | POA: Diagnosis not present

## 2016-05-14 DIAGNOSIS — J9601 Acute respiratory failure with hypoxia: Secondary | ICD-10-CM | POA: Diagnosis present

## 2016-05-14 DIAGNOSIS — T380X5A Adverse effect of glucocorticoids and synthetic analogues, initial encounter: Secondary | ICD-10-CM | POA: Diagnosis present

## 2016-05-14 DIAGNOSIS — J982 Interstitial emphysema: Secondary | ICD-10-CM | POA: Diagnosis not present

## 2016-05-14 DIAGNOSIS — R739 Hyperglycemia, unspecified: Secondary | ICD-10-CM | POA: Diagnosis present

## 2016-05-14 DIAGNOSIS — Z79899 Other long term (current) drug therapy: Secondary | ICD-10-CM | POA: Diagnosis not present

## 2016-05-14 DIAGNOSIS — K219 Gastro-esophageal reflux disease without esophagitis: Secondary | ICD-10-CM | POA: Diagnosis present

## 2016-05-14 DIAGNOSIS — I10 Essential (primary) hypertension: Secondary | ICD-10-CM | POA: Diagnosis present

## 2016-05-14 DIAGNOSIS — Z681 Body mass index (BMI) 19 or less, adult: Secondary | ICD-10-CM | POA: Diagnosis not present

## 2016-05-14 DIAGNOSIS — E43 Unspecified severe protein-calorie malnutrition: Secondary | ICD-10-CM | POA: Diagnosis present

## 2016-05-14 DIAGNOSIS — J8489 Other specified interstitial pulmonary diseases: Secondary | ICD-10-CM | POA: Diagnosis not present

## 2016-05-14 LAB — CBC
HEMATOCRIT: 34.5 % — AB (ref 39.0–52.0)
Hemoglobin: 11.5 g/dL — ABNORMAL LOW (ref 13.0–17.0)
MCH: 29.9 pg (ref 26.0–34.0)
MCHC: 33.3 g/dL (ref 30.0–36.0)
MCV: 89.8 fL (ref 78.0–100.0)
Platelets: 290 10*3/uL (ref 150–400)
RBC: 3.84 MIL/uL — ABNORMAL LOW (ref 4.22–5.81)
RDW: 12.9 % (ref 11.5–15.5)
WBC: 7.1 10*3/uL (ref 4.0–10.5)

## 2016-05-14 LAB — COMPREHENSIVE METABOLIC PANEL
ALBUMIN: 3 g/dL — AB (ref 3.5–5.0)
ALK PHOS: 56 U/L (ref 38–126)
ALT: 10 U/L — ABNORMAL LOW (ref 17–63)
ANION GAP: 8 (ref 5–15)
AST: 16 U/L (ref 15–41)
BILIRUBIN TOTAL: 0.4 mg/dL (ref 0.3–1.2)
BUN: 12 mg/dL (ref 6–20)
CALCIUM: 8.3 mg/dL — AB (ref 8.9–10.3)
CO2: 23 mmol/L (ref 22–32)
Chloride: 101 mmol/L (ref 101–111)
Creatinine, Ser: 0.83 mg/dL (ref 0.61–1.24)
GFR calc Af Amer: >60 mL/min (ref >60)
GFR calc non Af Amer: >60 mL/min (ref >60)
GLUCOSE: 158 mg/dL — AB (ref 65–99)
Potassium: 4.1 mmol/L (ref 3.5–5.1)
Sodium: 132 mmol/L — ABNORMAL LOW (ref 135–145)
TOTAL PROTEIN: 6.9 g/dL (ref 6.5–8.1)

## 2016-05-14 LAB — TSH: TSH: 0.858 u[IU]/mL (ref 0.350–4.500)

## 2016-05-14 MED ORDER — PANTOPRAZOLE SODIUM 40 MG PO TBEC
40.0000 mg | DELAYED_RELEASE_TABLET | Freq: Two times a day (BID) | ORAL | Status: DC
  Administered 2016-05-14 – 2016-05-17 (×6): 40 mg via ORAL
  Filled 2016-05-14 (×6): qty 1

## 2016-05-14 MED ORDER — ENSURE ENLIVE PO LIQD
237.0000 mL | Freq: Two times a day (BID) | ORAL | Status: DC
  Administered 2016-05-14 – 2016-05-17 (×6): 237 mL via ORAL

## 2016-05-14 MED ORDER — METHYLPREDNISOLONE SODIUM SUCC 125 MG IJ SOLR
80.0000 mg | Freq: Three times a day (TID) | INTRAMUSCULAR | Status: AC
  Administered 2016-05-14 – 2016-05-16 (×8): 80 mg via INTRAVENOUS
  Filled 2016-05-14 (×8): qty 2

## 2016-05-14 MED ORDER — IPRATROPIUM-ALBUTEROL 0.5-2.5 (3) MG/3ML IN SOLN
3.0000 mL | Freq: Three times a day (TID) | RESPIRATORY_TRACT | Status: DC
  Administered 2016-05-14 – 2016-05-16 (×7): 3 mL via RESPIRATORY_TRACT
  Filled 2016-05-14 (×7): qty 3

## 2016-05-14 MED ORDER — ALBUTEROL SULFATE (2.5 MG/3ML) 0.083% IN NEBU: 2.5000 mg | INHALATION_SOLUTION | RESPIRATORY_TRACT | Status: DC | PRN

## 2016-05-14 NOTE — Progress Notes (Signed)
Central tele called staff to informed that the patient's HR is in high 130-140s. Patient is asymptomatic and he didn't complained of any distress. MD informed and per his verbal order EKG was done. Will continue to monitor.

## 2016-05-14 NOTE — Progress Notes (Signed)
PROGRESS NOTE   [name]  VOH:[id number]    DOB: January 11, 1946    DOA: 05/13/2016  PCP: No PCP Per Patient   I have briefly reviewed patients previous medical records in [facility].  Brief Narrative:  71 year old Spanish-speaking male, PMH of GERD, HTN, former heavy smoker, at least 3 months history of mostly nonproductive cough, dyspnea, profound weight loss, 3 weeks history of left sided chest pain mostly with coughing and radiating to back, seen by outpatient pulmonology on 05/12/16 and being evaluated for interstitial lung disease, noted progressive worsening dyspnea with even minimal exertion or speaking that got worse in the 24 hours prior to admission leading to this admission. 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 Pulmonology formally consulted on 05/14/16.   Assessment & Plan:   Active Problems:   Interstitial lung disease ([place])   1. ILD +/- COPDacerbation: ABG 5/21: PH 7.38, PCO2: 42.3, PO2 75, oxygen saturation 95%. BNP: 7.5. Extensive outpatient workup done by pulmonology (hypersensitivity pneumonitis profile, aldolase, MPO, ANCA, anti-scleroderma antibodies, and SSA/SSB, CCP, rheumatoid factor, anti-DNA antibody, ANA) all pending. ESR 95. Detailed CTA report as below: Advanced subpleural honeycombing bilaterally consistent with UIP as can be seen in cases of IPF, drug toxicity and connective tissue disease; small volume pneumomediastinum, potentially related to rupture of a peripheral tear sac or bleb. Pulmonology consultation appreciated >favor AIP versus acute phase of UIP or BOOP superimposed on IPF and recommend high-dose IV steroid trial 48 hours and if improves, DC to outpatient follow-up, if not consider VATS biopsy for definitive diagnosis and high-dose PPI for GERD. Check Echo 2.  GERD: PPI  3.  Acute  respiratory failure with hypoxia: Secondary to problem #1. Oxygen supplements and need to reassess home O2 requirements prior to discharge. 4. Small pneumomediastinum: As per CT report as below. Pulmonology consulted. Supportive treatment and monitor. 5. Left-sided chest pain: DD: Musculoskeletal etiology from dry hacking cough versus related to pneumomediastinum. CTA negative for PE. POC troponin 1 negative. CK normal. EKG 4/21, apart from sinus tachycardia and no acute changes. Improved. 6. Essential hypertension: Cozaar held due to soft blood pressures. Monitor. 7. Profound weight loss: May be related to problem #1 versus other etiologies. Dietitian consultation. 8. Anemia: Follow CBCs. Likely of chronic disease.  9. Hyponatremia:? SIADH. Clinically euvolemic. Discontinue IV fluids and follow BMP.   DVT prophylaxis: Lovenox Code Status: Full Family Communication: Discussed with patient's son and granddaughter at bedside. Disposition: DC home when medically stable.   Consultants:  Pulmonology   Procedures:  None  Antimicrobials:  None    Subjective: Patient's granddaughter at bedside help with interpretation. Patient states that he feels much better with significant improvement in dyspnea both at rest and when he ambulated from his bed to the door of his room. Left-sided chest pain improved and down to 1-2/10, intermittent, some radiation to back and mostly on coughing. Minimal dry cough.   ROSNo dizziness or lightheadedness reported.  Objective:  Vitals:   05/14/16 0533 05/14/16 0746 05/14/16 1248 05/14/16 1351  BP: 102/62  (!) 103/54   Pulse: 88  (!) 109   Resp: 18  18   Temp: 98 F (36.7 C)  98 F (36.7 C)   TempSrc: Oral  Oral   SpO2: 97% 96% 100% 99%  Weight: 49.6 kg (109 lb 6.4 oz)     Height: _0  (1.6  m)       Examination:  General exam: Pleasant middle-aged male lying comfortably supine in bed. Frail and chronically ill-looking.  Respiratory system:  coarse, velcro like crackles heard in the bases. Rest clear. Respiratory effort normal. Cardiovascular system: S1 & S2 heard, RRR. No JVD, murmurs, rubs, gallops or clicks. No pedal edema.telemetry: SR-ST in the 100s.  Gastrointestinal system: Abdomen is nondistended, soft and nontender. No organomegaly or masses felt. Normal bowel sounds heard. Central nervous system: Alert and oriented. No focal neurological deficits. Extremities: Symmetric 5 x 5 power.digital clubbing.  Skin: No rashes, lesions or ulcers Psychiatry: Judgement and insight appear normal. Mood & affect appropriate.     Data Reviewed: I have personally reviewed following labs and imaging studies  CBC:  Recent Labs Lab 05/13/16 1327 05/14/16 0656  WBC 6.9 7.1  HGB 14.0 11.5*  HCT 41.0 34.5*  MCV 90.5 89.8  PLT 329 366   Basic Metabolic Panel:  Recent Labs Lab 05/12/16 1726 05/13/16 1327 05/14/16 0656  NA 130* 130* 132*  K 4.8 4.4 4.1  CL 91* 96* 101  CO2 _0 GLUCOSE 99 123* 158*  BUN _1 CREATININE 1.02 0.98 0.83  CALCIUM 10.2 9.0 8.3*   Liver Function Tests:  Recent Labs Lab 05/14/16 0656  AST 16  ALT 10*  ALKPHOS 56  BILITOT 0.4  PROT 6.9  ALBUMIN 3.0*   Cardiac Enzymes:  Recent Labs Lab 05/12/16 1726  CKTOTAL 55  CKMB 1.2    No results found for this or any previous visit (from the past 240 hour(s)).       Radiology Studies: Ct Angio Chest Pe W Or Wo Contrast  Result Date: 05/13/2016 CLINICAL DATA:  Worsening shortness of breath. EXAM: CT ANGIOGRAPHY CHEST WITH CONTRAST TECHNIQUE: Multidetector CT imaging of the chest was performed using the standard protocol during bolus administration of intravenous contrast. Multiplanar CT image reconstructions and MIPs were obtained to evaluate the vascular anatomy. CONTRAST:  80 cc Isovue 370 COMPARISON:  Chest x-ray 05/03/2016 FINDINGS: Cardiovascular: Heart size is upper normal. No pericardial effusion. No thoracic aortic  aneurysm. No evidence for dissection of the thoracic aorta. No filling defects in the opacified pulmonary arteries to suggest the presence of acute pulmonary embolus. Mediastinum/Nodes: 13 mm short axis subcarinal lymph node is associated with a 12 mm short axis low paraesophageal lymph node , 12 mm short axis left hilar lymph node and 9 mm short axis right hilar lymph node. The esophagus has normal imaging features. Trace volume of pneumomediastinum is identified in the upper chest near the thoracic inlet (see images 23 - 32 of series 407)). Lungs/Pleura: Biapical pleural-parenchymal scarring is identified. Chronic underlying interstitial lung disease evident with prominent subpleural honeycombing bilaterally. No pleural effusion. Small areas of confluent airspace disease are noted bilaterally and may be related to active inflammation or infection. Upper Abdomen: Unremarkable. Musculoskeletal: Bone windows reveal no worrisome lytic or sclerotic osseous lesions. Review of the MIP images confirms the above findings. IMPRESSION: 1. 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 2. Small volume pneumomediastinum, potentially related to rupture of a peripheral air sac or bleb. No evidence for fluid in the mediastinum, but correlation for signs/symptoms of esophageal tear suggested. 3. Mediastinal and hilar lymphadenopathy, likely reactive. 4. Scattered small areas of focal confluent airspace disease likely related to the underlying fibrotic process. Airspace infection cannot be entirely  excluded. Electronically Signed   By: Misty Stanley M.D.   On: 05/13/2016 16:20        Scheduled Meds: . enoxaparin (LOVENOX) injection  40 mg Subcutaneous Q24H  . feeding supplement (ENSURE ENLIVE)  237 mL Oral BID BM  . ipratropium-albuterol  3 mL Nebulization TID  . methylPREDNISolone (SOLU-MEDROL)  injection  80 mg Intravenous Q8H  . pantoprazole  40 mg Oral BID AC   Continuous Infusions: . sodium chloride 1,000 mL (05/14/16 1207)     LOS: 0 days     Reonna Finlayson, MD, FACP, FHM. [facility] Pager [phone]  If 7PM-7AM, please contact night-coverage [email or web address] Password [facility] 05/14/2016, 2:06 PM

## 2016-05-14 NOTE — Consult Note (Signed)
PULMONARY / CRITICAL CARE MEDICINE   Name: [name] MRN: [id number] DOB: November 29, 1945    ADMISSION DATE:  05/13/2016 CONSULTATION DATE:  05/14/17    REFERRING MD:  [facility]  CHIEF COMPLAINT:  Sob [name]   HISTORY OF PRESENT ILLNESS:   71 yo [place] immigrant quit smoking 15 y ago gradual worse sob x 6 m eval by Ann Lions 05/12/16 with dx of PF ? Element of copd rx pred/ trelogy and w/u for PF in progress - only took one dose of pred and worsening sob > admit with CTa c/w PF plus AS component with ESR 95 and placed on high dose IV steroids  No obvious day to day or daytime variability or assoc purulent sputum or mucus plugs or hemoptysis or cp or chest tightness, subjective wheeze or overt sinus or hb symptoms. No unusual exp hx or h/o childhood pna/ asthma or knowledge of premature birth.  Sleeping ok without nocturnal  or early am exacerbation  of respiratory  c/o's or need for noct saba. Also denies any obvious fluctuation of symptoms with weather or environmental changes or other aggravating or alleviating factors except as outlined above   Current Medications, Allergies, Complete Past Medical History, Past Surgical History, Family History, and Social History were reviewed in [employer] record.  No h/o exp to asbestos/ chemo/ no h/o RA or arthritis of any kind   ROS  The following are not active complaints unless bolded sore throat, dysphagia, dental problems, itching, sneezing,  nasal congestion or excess/ purulent secretions, ear ache,   fever, chills, sweats, unintended wt loss, classically pleuritic or exertional cp,  orthopnea pnd or leg swelling, presyncope, palpitations, abdominal pain, anorexia, nausea, vomiting, diarrhea  or change in bowel or bladder habits, change in stools or urine, dysuria,hematuria,  rash, arthralgias, visual complaints, headache, numbness, weakness or ataxia or problems with walking or coordination,  change in mood/affect or  memory.             PAST MEDICAL HISTORY :  He  has a past medical history of GERD (gastroesophageal reflux disease); Hypertension; and Pulmonary fibrosis ([place]).  PAST SURGICAL HISTORY: He  has a past surgical history that includes Hernia repair and Eye surgery.  Allergies  Allergen Reactions  . Cyclobenzaprine Swelling    Tongue and lip swelling    No current facility-administered medications on file prior to encounter.    Current Outpatient Prescriptions on File Prior to Encounter  Medication Sig  . Fluticasone-Umeclidin-Vilant (TRELEGY ELLIPTA) 100-62.5-25 MCG/INH AEPB Inhale 1 puff into the lungs daily.  . Glycopyrrolate-Formoterol (BEVESPI AEROSPHERE) 9-4.8 MCG/ACT AERO Inhale 2 puffs into the lungs 2 (two) times daily.   . predniSONE (DELTASONE) 10 MG tablet 40 mg daily x 2 days, then 47m daily x 2 days, then 118mdaily x 2 days, then 70m370maily x 2 days and stop (Patient taking differently: Take 5-10 mg by mouth See admin instructions. Tapered course started 05/13/16:  Take 1 tablet (10 mg) by mouth 4 times daily (every 6 hours) for 2 days, then take 1 tablet 2 times daily for 2 days, then take 1 tablet daily for 2 days, then take 1/2 tablet (5 mg) daily for 2 days, then stop)  . benzonatate (TESSALON) 100 MG capsule Take 2 capsules (200 mg total) by mouth 2 (two) times daily as needed for cough. (Patient not taking: Reported on 05/13/2016)  . HYDROcodone-acetaminophen (NORCO/VICODIN) 5-325 MG tablet Take 1-2 tablets by mouth every 6 (six) hours as  needed. (Patient not taking: Reported on 05/13/2016)    FAMILY HISTORY:  His has no family status information on file.    SOCIAL HISTORY: He  reports that he quit smoking about 15 years ago. He has a 80.00 pack-year smoking history. He has never used smokeless tobacco. He reports that he does not drink alcohol.     SUBJECTIVE:   appears comfortable lying flat on RA   VITAL SIGNS: BP 102/62 (BP Location: Right Arm)   Pulse 88    Temp 98 F (36.7 C) (Oral)   Resp 18   Ht _0  (1.6 m)   Wt 109 lb 6.4 oz (49.6 kg)   SpO2 96%   BMI 19.38 kg/m   HEMODYNAMICS:    VENTILATOR SETTINGS:    INTAKE / OUTPUT: I/O last 3 completed shifts: In: 653.8 [P.O.:240; I.V.:413.8] Out: 200 [Urine:200]  PHYSICAL EXAMINATION: General: elderly wm nad RA  Wt Readings from Last 3 Encounters:  05/14/16 109 lb 6.4 oz (49.6 kg)  05/12/16 109 lb (49.4 kg)    Vital signs reviewed   HEENT: nl dentition, turbinates bilaterally, and oropharynx. Nl external ear canals without cough reflex   NECK :  without JVD/Nodes/TM/ nl carotid upstrokes bilaterally   LUNGS: no acc muscle use,  Nl contour chest with coarse insp crackles bilaterally s cough   CV:  RRR  no s3 or murmur or increase in P2, and no edema   ABD:  soft and nontender with nl inspiratory excursion in the supine position. No bruits or organomegaly appreciated, bowel sounds nl  MS:  Nl gait/ ext warm without deformities, calf tenderness, cyanosis  - moderate clubbing No obvious joint restrictions   SKIN: warm and dry without lesions    NEURO:  alert, approp, nl sensorium with  no motor or cerebellar deficits apparent.   LABS:  BMET  Recent Labs Lab 05/12/16 1726 05/13/16 1327 05/14/16 0656  NA 130* 130* 132*  K 4.8 4.4 4.1  CL 91* 96* 101  CO2 _1 BUN _2 CREATININE 1.02 0.98 0.83  GLUCOSE 99 123* 158*    Electrolytes  Recent Labs Lab 05/12/16 1726 05/13/16 1327 05/14/16 0656  CALCIUM 10.2 9.0 8.3*    CBC  Recent Labs Lab 05/13/16 1327 05/14/16 0656  WBC 6.9 7.1  HGB 14.0 11.5*  HCT 41.0 34.5*  PLT 329 290    Coag's No results for input(s): APTT, INR in the last 168 hours.  Sepsis Markers No results for input(s): LATICACIDVEN, PROCALCITON, O2SATVEN in the last 168 hours.  ABG  Recent Labs Lab 05/13/16 1650  PHART 7.383  PCO2ART 42.3  PO2ART 75.0*    Liver Enzymes  Recent Labs Lab 05/14/16 0656   AST 16  ALT 10*  ALKPHOS 56  BILITOT 0.4  ALBUMIN 3.0*    Cardiac Enzymes No results for input(s): TROPONINI, PROBNP in the last 168 hours.  Glucose No results for input(s): GLUCAP in the last 168 hours.  Imaging Ct Angio Chest Pe W Or Wo Contrast  Result Date: 05/13/2016 CLINICAL DATA:  Worsening shortness of breath. EXAM: CT ANGIOGRAPHY CHEST WITH CONTRAST TECHNIQUE: Multidetector CT imaging of the chest was performed using the standard protocol during bolus administration of intravenous contrast. Multiplanar CT image reconstructions and MIPs were obtained to evaluate the vascular anatomy. CONTRAST:  80 cc Isovue 370 COMPARISON:  Chest x-ray 05/03/2016 FINDINGS: Cardiovascular: Heart size is upper normal. No pericardial effusion. No thoracic aortic aneurysm. No evidence for  dissection of the thoracic aorta. No filling defects in the opacified pulmonary arteries to suggest the presence of acute pulmonary embolus. Mediastinum/Nodes: 13 mm short axis subcarinal lymph node is associated with a 12 mm short axis low paraesophageal lymph node , 12 mm short axis left hilar lymph node and 9 mm short axis right hilar lymph node. The esophagus has normal imaging features. Trace volume of pneumomediastinum is identified in the upper chest near the thoracic inlet (see images 23 - 32 of series 407)). Lungs/Pleura: Biapical pleural-parenchymal scarring is identified. Chronic underlying interstitial lung disease evident with prominent subpleural honeycombing bilaterally. No pleural effusion. Small areas of confluent airspace disease are noted bilaterally and may be related to active inflammation or infection. Upper Abdomen: Unremarkable. Musculoskeletal: Bone windows reveal no worrisome lytic or sclerotic osseous lesions. Review of the MIP images confirms the above findings. IMPRESSION: 1. Advanced subpleural honeycombing bilaterally consistent with UIP as can be seen in cases of idiopathic pulmonary fibrosis,  drug toxicity, and connective tissue disease. 2. Small volume pneumomediastinum, potentially related to rupture of a peripheral air sac or bleb. No evidence for fluid in the mediastinum, but correlation for signs/symptoms of esophageal tear suggested. 3. Mediastinal and hilar lymphadenopathy, likely reactive. 4. Scattered small areas of focal confluent airspace disease likely related to the underlying fibrotic process. Airspace infection cannot be entirely excluded. Electronically Signed   By: Misty Stanley M.D.   On: 05/13/2016 16:20      ASSESSMENT / PLAN:    1)  Progressive ILD ? Etiology   ddx:   Miscellaneous:Alv microlithiasis, alv proteinosis, asp, bronchiectais, BOOP   ARDS/ AIP Occupational dz/ HSP > labs sent from office/ pending  Neoplasm/ lympangitic carcinomatosis  Infection Drug  Pulmonary emboli, Protein disorders Edema/Eosinophilic dz> both already ruled out by admit labs Sarcoidosis Connective tissue dz Hist X / Hemorrhage Idiopathic    Given very high ESR favor AIP vs acute phase of UIP or BOOP superimposed on IPF    rec:   Agree with high dose IV streroid trial x 48 h and if improves dc/ to outpt f/u, if not consider VATS bx for definitive dx           Also rec high dose GERD rx based on assoc with PF      2) Hypoxemic with activity secondary to #1 > will probably need portable 02  D/c if has not improved by then      Strongly doubt much copd here as copd meds have made no difference to date   Christinia Gully, MD Pulmonary and [place] [phone] After 5:30 PM or weekends, use Beeper [phone]

## 2016-05-14 NOTE — Progress Notes (Signed)
[name] [id number] Admitted to 5W20: 05/13/2016 Attending Provider: Oswald Hillock, MD    [name] is a 71 y.o. male patient admitted from ED awake, alert  & orientated  X 3,  Full Code, VSS - Blood pressure 109/65, pulse (!) 110, temperature 98 F (36.7 C), temperature source Oral, resp. rate 18, height _0  (1.6 m), SpO2 96 %., RA, no distress noted. Tele # [id number] placed and pt is currently running:ST   IV site WDL:  with a transparent dsg that's clean dry and intact.  Allergies:   Allergies  Allergen Reactions  . Cyclobenzaprine Swelling    Tongue and lip swelling     Past Medical History:  Diagnosis Date  . GERD (gastroesophageal reflux disease)   . Hypertension   . Pulmonary fibrosis ([place])     History:  obtained from Patient and family through use of interpreter  Pt orientation to unit, room and routine. Information packet given to patient/family.  Admission INP armband ID verified with patient/family, and in place. SR up x 2, fall risk assessment complete with Patient and family verbalizing understanding of risks associated with falls. Pt demonstrates an understanding of how to use the call bell and to call for help before getting out of bed. Skin, clean-dry- intact without evidence of bruising, or skin tears.   No evidence of skin break down noted on exam.    Will cont to monitor and assist as needed.  Parthenia Ames, RN 05/14/2016 1:00 AM

## 2016-05-15 ENCOUNTER — Inpatient Hospital Stay (HOSPITAL_COMMUNITY): Payer: Medicaid Other

## 2016-05-15 DIAGNOSIS — E43 Unspecified severe protein-calorie malnutrition: Secondary | ICD-10-CM | POA: Insufficient documentation

## 2016-05-15 DIAGNOSIS — J9621 Acute and chronic respiratory failure with hypoxia: Secondary | ICD-10-CM

## 2016-05-15 DIAGNOSIS — R739 Hyperglycemia, unspecified: Secondary | ICD-10-CM

## 2016-05-15 DIAGNOSIS — R0600 Dyspnea, unspecified: Secondary | ICD-10-CM

## 2016-05-15 DIAGNOSIS — J189 Pneumonia, unspecified organism: Secondary | ICD-10-CM

## 2016-05-15 DIAGNOSIS — T380X5A Adverse effect of glucocorticoids and synthetic analogues, initial encounter: Secondary | ICD-10-CM

## 2016-05-15 HISTORY — PX: TRANSTHORACIC ECHOCARDIOGRAM: SHX275

## 2016-05-15 LAB — RHEUMATOID FACTOR: Rhuematoid fact SerPl-aCnc: <14 IU/mL (ref <14)

## 2016-05-15 LAB — CBC
HEMATOCRIT: 37.3 % — AB (ref 39.0–52.0)
Hemoglobin: 12.3 g/dL — ABNORMAL LOW (ref 13.0–17.0)
MCH: 30 pg (ref 26.0–34.0)
MCHC: 33 g/dL (ref 30.0–36.0)
MCV: 91 fL (ref 78.0–100.0)
Platelets: 278 10*3/uL (ref 150–400)
RBC: 4.1 MIL/uL — AB (ref 4.22–5.81)
RDW: 13.4 % (ref 11.5–15.5)
WBC: 16.6 10*3/uL — AB (ref 4.0–10.5)

## 2016-05-15 LAB — BASIC METABOLIC PANEL
ANION GAP: 8 (ref 5–15)
BUN: 13 mg/dL (ref 6–20)
CO2: 27 mmol/L (ref 22–32)
CREATININE: 0.95 mg/dL (ref 0.61–1.24)
Calcium: 8.4 mg/dL — ABNORMAL LOW (ref 8.9–10.3)
Chloride: 101 mmol/L (ref 101–111)
GFR calc non Af Amer: >60 mL/min (ref >60)
Glucose, Bld: 196 mg/dL — ABNORMAL HIGH (ref 65–99)
POTASSIUM: 3.9 mmol/L (ref 3.5–5.1)
SODIUM: 136 mmol/L (ref 135–145)

## 2016-05-15 LAB — ANTI-DNA ANTIBODY, DOUBLE-STRANDED

## 2016-05-15 LAB — SJOGRENS SYNDROME-B EXTRACTABLE NUCLEAR ANTIBODY: SSB (LA) (ENA) ANTIBODY, IGG: NEGATIVE

## 2016-05-15 LAB — ANA: ANA: NEGATIVE

## 2016-05-15 LAB — ANTI-SCLERODERMA ANTIBODY: SCLERODERMA (SCL-70) (ENA) ANTIBODY, IGG: NEGATIVE

## 2016-05-15 LAB — ALDOLASE: Aldolase: 2.8 U/L (ref <=8.1)

## 2016-05-15 LAB — CYCLIC CITRUL PEPTIDE ANTIBODY, IGG

## 2016-05-15 LAB — SJOGRENS SYNDROME-A EXTRACTABLE NUCLEAR ANTIBODY: SSA (RO) (ENA) ANTIBODY, IGG: NEGATIVE

## 2016-05-15 LAB — D-DIMER, QUANTITATIVE (NOT AT ARMC): D DIMER QUANT: 0.69 ug{FEU}/mL — AB (ref <0.50)

## 2016-05-15 LAB — ECHOCARDIOGRAM COMPLETE
HEIGHTINCHES: 63 in
Weight: 1750.4 oz

## 2016-05-15 LAB — GLUCOSE, CAPILLARY
GLUCOSE-CAPILLARY: 141 mg/dL — AB (ref 65–99)
GLUCOSE-CAPILLARY: 130 mg/dL — AB (ref 65–99)

## 2016-05-15 LAB — MPO/PR-3 (ANCA) ANTIBODIES

## 2016-05-15 LAB — HIV ANTIBODY (ROUTINE TESTING W REFLEX): HIV Screen 4th Generation wRfx: NONREACTIVE

## 2016-05-15 MED ORDER — ADULT MULTIVITAMIN W/MINERALS CH
1.0000 | ORAL_TABLET | Freq: Every day | ORAL | Status: DC
  Administered 2016-05-15 – 2016-05-17 (×3): 1 via ORAL
  Filled 2016-05-15 (×3): qty 1

## 2016-05-15 MED ORDER — INSULIN ASPART 100 UNIT/ML ~~LOC~~ SOLN
0.0000 [IU] | Freq: Three times a day (TID) | SUBCUTANEOUS | Status: DC
  Administered 2016-05-15 – 2016-05-16 (×2): 1 [IU] via SUBCUTANEOUS
  Administered 2016-05-16 (×2): 2 [IU] via SUBCUTANEOUS
  Administered 2016-05-17: 3 [IU] via SUBCUTANEOUS
  Administered 2016-05-17: 1 [IU] via SUBCUTANEOUS

## 2016-05-15 NOTE — Progress Notes (Signed)
PULMONARY / CRITICAL CARE MEDICINE   Name: [name] MRN: [id number] DOB: 28-Feb-1945    ADMISSION DATE:  05/13/2016 CONSULTATION DATE:  05/14/17    REFERRING MD:  [facility]  CHIEF COMPLAINT:  Sob [name]   HISTORY OF PRESENT ILLNESS:   71 yo [place] immigrant quit smoking 15 y ago gradual worse sob x 6 m eval by Ann Lions 05/12/16 with dx of PF ? Element of copd rx pred/ trelogy and w/u for PF in progress - only took one dose of pred and worsening sob > admit with CTa c/w PF plus AS component with ESR 95 and placed on high dose IV steroids  SUBJECTIVE:  Feels a little better   VITAL SIGNS: BP (!) 106/53 (BP Location: Left Arm)   Pulse 94   Temp 97.9 F (36.6 C)   Resp 16   Ht _0  (1.6 m)   Wt 109 lb 6.4 oz (49.6 kg)   SpO2 95%   BMI 19.38 kg/m  Room air  INTAKE / OUTPUT:  Intake/Output Summary (Last 24 hours) at 05/15/16 0858 Last data filed at 05/15/16 6295  Gross per 24 hour  Intake              865 ml  Output              525 ml  Net              340 ml     Wt Readings from Last 3 Encounters:  05/14/16 109 lb 6.4 oz (49.6 kg)  05/12/16 109 lb (49.4 kg)    General appearance:  Frail 72 Year old  Male,   NAD, currently in acute distress, non-english speaker. Family interpreting for him Eyes: anicteric sclerae, moist conjunctivae; PERRL, EOMI bilaterally. Mouth:  membranes and no mucosal ulcerations; normal hard and soft palate Neck: Trachea midline; neck supple, no JVD Lungs/chest: posterior dry rales, with normal respiratory effort and no intercostal retractions CV: tachy RRR, no MRGs  Abdomen: Soft, non-tender; no masses or HSM Extremities: No peripheral edema or extremity lymphadenopathy Skin: Normal temperature, turgor and texture; no rash, ulcers or subcutaneous nodules Neuro/Psych: Appropriate affect, alert and oriented to person, place and time  LABS:  BMET  Recent Labs Lab 05/12/16 1726 05/13/16 1327 05/14/16 0656  NA 130* 130* 132*  K 4.8  4.4 4.1  CL 91* 96* 101  CO2 _1 BUN _2 CREATININE 1.02 0.98 0.83  GLUCOSE 99 123* 158*    Electrolytes  Recent Labs Lab 05/12/16 1726 05/13/16 1327 05/14/16 0656  CALCIUM 10.2 9.0 8.3*    CBC  Recent Labs Lab 05/13/16 1327 05/14/16 0656  WBC 6.9 7.1  HGB 14.0 11.5*  HCT 41.0 34.5*  PLT 329 290    Coag's No results for input(s): APTT, INR in the last 168 hours.  Sepsis Markers No results for input(s): LATICACIDVEN, PROCALCITON, O2SATVEN in the last 168 hours.  ABG  Recent Labs Lab 05/13/16 1650  PHART 7.383  PCO2ART 42.3  PO2ART 75.0*    Liver Enzymes  Recent Labs Lab 05/14/16 0656  AST 16  ALT 10*  ALKPHOS 56  BILITOT 0.4  ALBUMIN 3.0*    Cardiac Enzymes No results for input(s): TROPONINI, PROBNP in the last 168 hours.  Glucose No results for input(s): GLUCAP in the last 168 hours.  Imaging No results found.   Current ILD diagnostics BNP: 7.5 4/20 HSP>>> 4/20 Aldolase>>> 4/20 Mpo/pr-3 (anca)antibodies>>> 4/20 ANCA screen reflex>>>  4/20 Anticscleroderma antibody>> 4/20 sjogrens syndrome B extractible nuclear ab>>> 4/20 sjogrens syndrome A extractible nuclear ab>>> 1/30 cyclic citrul peptide ab iGg>>> 4/20 RF>>> 4/20: anti-dna antibody double stranded>>> 4/20 ANA>>> 4/20 sed rate: 95   ASSESSMENT / PLAN:  Acute hypoxic respiratory failure in setting of progressive ILD ddx: ARDS/AIP Occupational dz; hypersensitivity panel sent in office & remains pending Doubt lymphangitic carcinomatosis  Infection seems unlikely Pulmonary edema seems unlikely Eosinophillic disease ruled out Connective tissue disease (pending) No evidence of hemorrhage  ? idopathic   Currently favor Acute interstitial Pneumonia (non-infectious), acute UIP flare or BOOP superimposed on underlying IPF  -high dose steroids initiated on 4/22 -feels a little better  Plan Repeat CXR in am Cont current dosing of steroids Walking oximetry prior  to [name] ACNP-BC [name] Pager # [phone] OR # [phone] if no answer   STAFF NOTE: I, Daniel Roof, MD FACP have personally reviewed patient's available data, including medical history, events of note, physical examination and test results as part of my evaluation. I have discussed with resident/NP and other care providers such as pharmacist, RN and RRT. In addition, I personally evaluated patient and elicited key findings of:    [name]. Daniel Mould, MD, FACP Pgr: [facility] 05/15/2016 11:59 AM   STAFF NOTE: Daniel Dibbles, MD FACP have personally reviewed patient's available data, including medical history, events of note, physical examination and test results as part of my evaluation. I have discussed with resident/NP and other care providers such as pharmacist, RN and RRT. In addition, I personally evaluated patient and elicited key findings of: awake, alert, no distress, not using accessory muscles, dry crackles, reports about same or slight improved resp status, all CT and pcxr reviewed, a lot of this is burnt out appearing, esr and clinical status may indicate flare, steroids to remain, repeat pcxr, follow O2 needs, follow auto immune work to final  [name]. Daniel Mould, MD, [place] Pgr: [place] [facility] 05/15/2016 12:16 PM

## 2016-05-15 NOTE — Progress Notes (Signed)
Initial Nutrition Assessment  DOCUMENTATION CODES:   Severe malnutrition in context of chronic illness  INTERVENTION:   Ensure Enlive po BID, each supplement provides 350 kcal and 20 grams of protein  Magic cup TID with meals, each supplement provides 290 kcal and 9 grams of protein  MVI  NUTRITION DIAGNOSIS:   Malnutrition related to catabolic illness, COPD, lung disease as evidenced by 25 percent weight loss, severe depletion of muscle mass, severe depletion of body fat.  GOAL:   Patient will meet greater than or equal to 90% of their needs  MONITOR:   PO intake, Supplement acceptance, Labs, Weight trends  REASON FOR ASSESSMENT:   Consult, Malnutrition Screening Tool Assessment of nutrition requirement/status  ASSESSMENT:   71 year old Spanish-speaking male, PMH of GERD, HTN, former heavy smoker, at least 3 months history of mostly nonproductive cough, dyspnea, profound weight loss, 3 weeks history of left sided chest pain mostly with coughing and radiating to back, seen by outpatient pulmonology on 05/12/16 and being evaluated for interstitial lung disease and COPD exacerbation   Met with pt in room today. Pt reports poor appetite and oral intake for several months pta. Pt reports that he has lost weight over the past year. Pt's usual body weight is around 145lbs; this is a 25% weight loss which is severe. Pt reports fair appetite today. Pt's lunch tray on side table was about 50% eaten. Pt with severe muscle and fat depletion over entire body. Pt already getting Ensure; RD will order Magic Cups and multivitamin. Continue to encourage intake of meals and supplements.    Medications reviewed and include: lovenox, insulin, solu-medrol, protonix  Labs reviewed: Ca 8.4(L) adj. 9.2 wnl, alb 3.0(L) Wbc- 16.6(H) cbgs- 123, 158, 196 x 48 hrs  Nutrition-Focused physical exam completed. Findings are severe fat and muscle depletion over entire body, and no edema.   Diet Order:   Diet regular Room service appropriate? Yes; Fluid consistency: Thin  Skin:  Reviewed, no issues  Last BM:  4/23  Height:   Ht Readings from Last 1 Encounters:  05/14/16 _0  (1.6 m)    Weight:   Wt Readings from Last 1 Encounters:  05/14/16 109 lb 6.4 oz (49.6 kg)    Ideal Body Weight:  56.3 kg  BMI:  Body mass index is 19.38 kg/m.  Estimated Nutritional Needs:   Kcal:  1600-1900kcal/day   Protein:  74-84g/day   Fluid:  >1.6L/day   EDUCATION NEEDS:   No education needs identified at this time  Koleen Distance, RD, LDN Pager #[phone]

## 2016-05-15 NOTE — Progress Notes (Signed)
PROGRESS NOTE   [name]  JME:[id number]    DOB: Feb 06, 1945    DOA: 05/13/2016  PCP: No PCP Per Patient   I have briefly reviewed patients previous medical records in [facility].  Brief Narrative:  71 year old Spanish-speaking male, PMH of GERD, HTN, former heavy smoker, at least 3 months history of mostly nonproductive cough, dyspnea, profound weight loss, 3 weeks history of left sided chest pain mostly with coughing and radiating to back, seen by outpatient pulmonology on 05/12/16 and being evaluated for interstitial lung disease, noted progressive worsening dyspnea with even minimal exertion or speaking that got worse in the 24 hours prior to admission leading to this admission. 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 Pulmonology following. Improving.   Assessment & Plan:   Active Problems:   Interstitial lung disease ([place])   1. ILD +/- COPDacerbation: ABG 5/21: PH 7.38, PCO2: 42.3, PO2 75, oxygen saturation 95%. BNP: 7.5. Extensive outpatient workup done by pulmonology (hypersensitivity pneumonitis profile, aldolase, MPO, ANCA, anti-scleroderma antibodies, and SSA/SSB, CCP, rheumatoid factor, anti-DNA antibody, ANA) all pending. ESR 95. Detailed CTA report as below: Advanced subpleural honeycombing bilaterally consistent with UIP as can be seen in cases of IPF, drug toxicity and connective tissue disease; small volume pneumomediastinum, potentially related to rupture of a peripheral tear sac or bleb. 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 Check Echo. Improving. Needs home O2 evaluation prior to discharge. 2.  GERD: PPI  3.  Acute respiratory failure with hypoxia: Secondary to problem #1.  4. Small pneumomediastinum: As per CT report as below. Pulmonology consulted. Supportive treatment and monitor. 5. Left-sided chest pain: DD: Musculoskeletal etiology from dry hacking cough versus related to pneumomediastinum. CTA negative for PE. POC troponin 1 negative. CK normal. EKG 4/21, apart from sinus tachycardia and no acute changes. Resolved. 6. Essential hypertension: Cozaar held due to soft blood pressures. Monitor. 7. Profound weight loss: May be related to problem #1 versus other etiologies. Dietitian consultation. 8. Anemia: Stable. Likely of chronic disease.  9. Hyponatremia:? SIADH. Clinically euvolemic. Resolved. 10. Sinus tachycardia: Intermittent. Asymptomatic. TSH 0.858. Reviewed EKG from 4/22. May be related to hypoxia. Follow 2-D echo. 11. Hyperglycemia: Likely secondary to steroids. Check A1c to rule out diabetes. Monitor CBGs and SSI initiated.   DVT prophylaxis: Lovenox Code Status: Full Family Communication: Discussed with patient's friend and son at bedside. Disposition: DC home when medically stable, possibly 05/17/16..   Consultants:  Pulmonology   Procedures:  None  Antimicrobials:  None    Subjective: Dyspnea continues to improve. No further chest pain. Patient's male friend at bedside helped interpret.  ROSNo dizziness or lightheadedness reported.  Objective:  Vitals:   05/14/16 2058 05/14/16 2139 05/15/16 0509 05/15/16 0753  BP:  (!) 100/48 (!) 106/53   Pulse:  (!) 111 94   Resp:  17 16   Temp:  98 F (36.7 C) 97.9 F (36.6 C)   TempSrc:      SpO2: 98% 96% 97% 95%  Weight:      Height:        Examination:  General exam: Pleasant middle-aged male lying  comfortably supine in bed. Frail and chronically ill-looking.  Respiratory system: coarse, velcro like  crackles heard in the bases. Rest clear. Respiratory effort normal. Cardiovascular system: S1 & S2 heard, RRR. No JVD, murmurs, rubs, gallops or clicks. No pedal edema.telemetry: SR. Occasional mild sinus tachycardia. Gastrointestinal system: Abdomen is nondistended, soft and nontender. No organomegaly or masses felt. Normal bowel sounds heard. Central nervous system: Alert and oriented. No focal neurological deficits. Extremities: Symmetric 5 x 5 power.digital clubbing.  Skin: No rashes, lesions or ulcers Psychiatry: Judgement and insight appear normal. Mood & affect appropriate.     Data Reviewed: I have personally reviewed following labs and imaging studies  CBC:  Recent Labs Lab 05/13/16 1327 05/14/16 0656 05/15/16 0903  WBC 6.9 7.1 16.6*  HGB 14.0 11.5* 12.3*  HCT 41.0 34.5* 37.3*  MCV 90.5 89.8 91.0  PLT 329 290 419   Basic Metabolic Panel:  Recent Labs Lab 05/12/16 1726 05/13/16 1327 05/14/16 0656 05/15/16 0903  NA 130* 130* 132* 136  K 4.8 4.4 4.1 3.9  CL 91* 96* 101 101  CO2 _0 GLUCOSE 99 123* 158* 196*  BUN _1 CREATININE 1.02 0.98 0.83 0.95  CALCIUM 10.2 9.0 8.3* 8.4*   Liver Function Tests:  Recent Labs Lab 05/14/16 0656  AST 16  ALT 10*  ALKPHOS 56  BILITOT 0.4  PROT 6.9  ALBUMIN 3.0*   Cardiac Enzymes:  Recent Labs Lab 05/12/16 1726  CKTOTAL 55  CKMB 1.2    No results found for this or any previous visit (from the past 240 hour(s)).       Radiology Studies: Ct Angio Chest Pe W Or Wo Contrast  Result Date: 05/13/2016 CLINICAL DATA:  Worsening shortness of breath. EXAM: CT ANGIOGRAPHY CHEST WITH CONTRAST TECHNIQUE: Multidetector CT imaging of the chest was performed using the standard protocol during bolus administration of intravenous contrast. Multiplanar CT image reconstructions and MIPs were obtained to evaluate the vascular anatomy. CONTRAST:  80 cc Isovue 370 COMPARISON:  Chest x-ray 05/03/2016 FINDINGS:  Cardiovascular: Heart size is upper normal. No pericardial effusion. No thoracic aortic aneurysm. No evidence for dissection of the thoracic aorta. No filling defects in the opacified pulmonary arteries to suggest the presence of acute pulmonary embolus. Mediastinum/Nodes: 13 mm short axis subcarinal lymph node is associated with a 12 mm short axis low paraesophageal lymph node , 12 mm short axis left hilar lymph node and 9 mm short axis right hilar lymph node. The esophagus has normal imaging features. Trace volume of pneumomediastinum is identified in the upper chest near the thoracic inlet (see images 23 - 32 of series 407)). Lungs/Pleura: Biapical pleural-parenchymal scarring is identified. Chronic underlying interstitial lung disease evident with prominent subpleural honeycombing bilaterally. No pleural effusion. Small areas of confluent airspace disease are noted bilaterally and may be related to active inflammation or infection. Upper Abdomen: Unremarkable. Musculoskeletal: Bone windows reveal no worrisome lytic or sclerotic osseous lesions. Review of the MIP images confirms the above findings. IMPRESSION: 1. 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 2. Small volume pneumomediastinum, potentially related to rupture of a peripheral air sac or bleb. No evidence for fluid in the mediastinum, but correlation for signs/symptoms of esophageal tear suggested. 3. Mediastinal and hilar lymphadenopathy, likely reactive. 4. Scattered small areas of focal confluent airspace disease likely related to the underlying fibrotic process. Airspace infection cannot  be entirely excluded. Electronically Signed   By: Misty Stanley M.D.   On: 05/13/2016 16:20        Scheduled Meds: . enoxaparin (LOVENOX) injection  40 mg Subcutaneous Q24H  . feeding supplement (ENSURE ENLIVE)  237 mL Oral BID BM  .  ipratropium-albuterol  3 mL Nebulization TID  . methylPREDNISolone (SOLU-MEDROL) injection  80 mg Intravenous Q8H  . pantoprazole  40 mg Oral BID AC   Continuous Infusions:    LOS: 1 day     Laikynn Pollio, MD, FACP, FHM. [facility] Pager [phone]  If 7PM-7AM, please contact night-coverage [email or web address] Password [facility] 05/15/2016, 12:25 PM

## 2016-05-15 NOTE — Progress Notes (Signed)
  Echocardiogram 2D Echocardiogram has been performed.  Darlina Sicilian M 05/15/2016, 2:12 PM

## 2016-05-15 NOTE — Progress Notes (Signed)
SATURATION QUALIFICATIONS: (This note is used to comply with regulatory documentation for home oxygen)  Patient Saturations on Room Air at Rest =97%  Patient Saturations on Room Air while Ambulating = dropped as low as 87%  Patient Saturations on 1 Liters of oxygen while Ambulating = 92%

## 2016-05-16 ENCOUNTER — Inpatient Hospital Stay (HOSPITAL_COMMUNITY): Payer: Medicaid Other

## 2016-05-16 DIAGNOSIS — R071 Chest pain on breathing: Secondary | ICD-10-CM

## 2016-05-16 DIAGNOSIS — J982 Interstitial emphysema: Secondary | ICD-10-CM

## 2016-05-16 LAB — HEMOGLOBIN A1C
HEMOGLOBIN A1C: 5.7 % — AB (ref 4.8–5.6)
Mean Plasma Glucose: 117 mg/dL

## 2016-05-16 LAB — GLUCOSE, CAPILLARY
GLUCOSE-CAPILLARY: 141 mg/dL — AB (ref 65–99)
Glucose-Capillary: 191 mg/dL — ABNORMAL HIGH (ref 65–99)
Glucose-Capillary: 191 mg/dL — ABNORMAL HIGH (ref 65–99)

## 2016-05-16 LAB — ANCA SCREEN W REFLEX TITER: ANCA SCREEN: POSITIVE — AB

## 2016-05-16 LAB — C-ANCA TITER

## 2016-05-16 MED ORDER — IPRATROPIUM-ALBUTEROL 0.5-2.5 (3) MG/3ML IN SOLN
3.0000 mL | Freq: Two times a day (BID) | RESPIRATORY_TRACT | Status: DC
Start: 2016-05-16 — End: 2016-05-17
  Administered 2016-05-16 – 2016-05-17 (×2): 3 mL via RESPIRATORY_TRACT
  Filled 2016-05-16 (×2): qty 3

## 2016-05-16 MED ORDER — PREDNISONE 20 MG PO TABS
40.0000 mg | ORAL_TABLET | Freq: Every day | ORAL | Status: DC
  Administered 2016-05-17: 40 mg via ORAL
  Filled 2016-05-16: qty 2

## 2016-05-16 NOTE — Progress Notes (Signed)
PROGRESS NOTE   [name]  JKD:[id number]    DOB: Oct 02, 1945    DOA: 05/13/2016  PCP: No PCP Per Patient   I have briefly reviewed patients previous medical records in [facility].  Brief Narrative:  71 year old Spanish-speaking male, PMH of GERD, HTN, former heavy smoker, at least 3 months history of mostly nonproductive cough, dyspnea, profound weight loss, 3 weeks history of left sided chest pain mostly with coughing and radiating to back, seen by outpatient pulmonology on 05/12/16 and being evaluated for interstitial lung disease, noted progressive worsening dyspnea with even minimal exertion or speaking that got worse in the 24 hours prior to admission leading to this admission. 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 Pulmonology following. Improving.   Assessment & Plan:   Active Problems:   Interstitial lung disease ([facility])   Pneumonitis   Protein-calorie malnutrition, severe   1. ILD +/- COPDacerbation: ABG 5/21: PH 7.38, PCO2: 42.3, PO2 75, oxygen saturation 95%. BNP: 7.5. Extensive outpatient workup done by pulmonology (hypersensitivity pneumonitis profile, aldolase, MPO, ANCA, anti-scleroderma antibodies, and SSA/SSB, CCP, rheumatoid factor, anti-DNA antibody, ANA) all pending. ESR 95. Detailed CTA report as below: Advanced subpleural honeycombing bilaterally consistent with UIP as can be seen in cases of IPF, drug toxicity and connective tissue disease; small volume pneumomediastinum, potentially related to rupture of a peripheral tear sac or bleb. 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  GERD: PPI  3.  Acute respiratory failure with hypoxia: Secondary to problem #1. Will need home oxygen. 4. Small pneumomediastinum: As per CT report as below. Pulmonology consulted. Supportive treatment and monitor.Need follow-up CT as an outpatient  5. Left-sided chest pain: DD: Musculoskeletal etiology from dry hacking cough versus related to pneumomediastinum. CTA negative for PE. POC troponin 1 negative. CK normal. EKG 4/21, apart from sinus tachycardia and no acute changes. Resolved. 6. Essential hypertension: Cozaar held due to soft blood pressures. Monitor. 7. Profound weight loss: May be related to problem #1 versus other etiologies. Dietitian consultation. 8. Anemia: Stable. Likely of chronic disease.  9. Hyponatremia:? SIADH. Clinically euvolemic. Resolved. 10. Sinus tachycardia: Intermittent. Asymptomatic. TSH 0.858. Reviewed EKG from 4/22. May be related to hypoxia. Follow 2-D echo. 11. Hyperglycemia: Likely secondary to steroids.  A1c is 5,7. Monitor CBGs and SSI initiated.   DVT prophylaxis: Lovenox Code Status: Full Family Communication: Discussed with patient's friend  at bedside. Disposition: DC home when medically stable, hopefully in a.m. was transitioned to by mouth prednisone   Consultants:  Pulmonology   Procedures:  None  Antimicrobials:  None    Subjective: Reports dyspnea is improving, actually no dyspnea at rest, only on exertion, denies any chest pain, cough or fever .  ROSNo dizziness or lightheadedness reported.  Objective:  Vitals:   05/15/16 2109 05/15/16 2136 05/16/16 0529 05/16/16 0805  BP: 114/62  125/63   Pulse: 91  85   Resp: 18     Temp: 98.1 F (36.7 C)  97.7 F (36.5 C)   TempSrc:   Oral   SpO2: 97% 98% 99% 97%  Weight:      Height:        Examination:  General exam: Pleasant middle-aged male lying comfortably supine in bed. Frail and chronically ill-looking.  Respiratory system: Course, Velcro-like crackles,  negative accessory muscle, no wheezing . Cardiovascular system: S1, S2 year, nor a murmur gallops, regular rate and rhythm  Gastrointestinal system: Abdomen soft, nontender nondistended , BS+. Central nervous system: Alert and oriented. No focal neurological deficits. Extremities: Symmetric 5 x 5 power.digital clubbing.  Skin: No rashes, lesions or ulcers Psychiatry: Judgement and insight appear normal. Mood & affect appropriate.     Data Reviewed: I have personally reviewed following labs and imaging studies  CBC:  Recent Labs Lab 05/13/16 1327 05/14/16 0656 05/15/16 0903  WBC 6.9 7.1 16.6*  HGB 14.0 11.5* 12.3*  HCT 41.0 34.5* 37.3*  MCV 90.5 89.8 91.0  PLT 329 290 774   Basic Metabolic Panel:  Recent Labs Lab 05/12/16 1726 05/13/16 1327 05/14/16 0656 05/15/16 0903  NA 130* 130* 132* 136  K 4.8 4.4 4.1 3.9  CL 91* 96* 101 101  CO2 _0 GLUCOSE 99 123* 158* 196*  BUN _1 CREATININE 1.02 0.98 0.83 0.95  CALCIUM 10.2 9.0 8.3* 8.4*   Liver Function Tests:  Recent Labs Lab 05/14/16 0656  AST 16  ALT 10*  ALKPHOS 56  BILITOT 0.4  PROT 6.9  ALBUMIN 3.0*   Cardiac Enzymes:  Recent Labs Lab 05/12/16 1726  CKTOTAL 55  CKMB 1.2    No results found for this or any previous visit (from the past 240 hour(s)).       Radiology Studies: Dg Chest 2 View  Result Date: 05/16/2016 CLINICAL DATA:  Pneumonitis. EXAM: CHEST  2 VIEW COMPARISON:  CT 05/13/2016. FINDINGS: Mediastinum and hilar structures stable. Reference is made to chest CT of 05/13/2016 which demonstrated a small pneumomediastinum. Heart size normal. Stable diffuse bilateral interstitial prominence most consistent with chronic interstitial lung disease. Active pneumonitis cannot be excluded . No pleural effusion or pneumothorax. Biapical pleural thickening noted consistent with scarring. Stable lower thoracic/upper lumbar mild compression fracture . IMPRESSION: Stable changes of  chronic interstitial lung disease. No acute alveolar infiltrates noted. Electronically Signed   By: Marcello Moores  Register   On: 05/16/2016 07:31        Scheduled Meds: . enoxaparin (LOVENOX) injection  40 mg Subcutaneous Q24H  . feeding supplement (ENSURE ENLIVE)  237 mL Oral BID BM  . insulin aspart  0-9 Units Subcutaneous TID WC  . ipratropium-albuterol  3 mL Nebulization BID  . methylPREDNISolone (SOLU-MEDROL) injection  80 mg Intravenous Q8H  . multivitamin with minerals  1 tablet Oral Daily  . pantoprazole  40 mg Oral BID AC  . [START ON 05/17/2016] predniSONE  40 mg Oral Q breakfast   Continuous Infusions:    LOS: 2 days     Phillips Climes, MD [facility] Pager [phone]  If 7PM-7AM, please contact night-coverage [email or web address] Password [email or web address] 05/16/2016, 1:32 PM

## 2016-05-16 NOTE — Progress Notes (Signed)
SATURATION QUALIFICATIONS: (This note is used to comply with regulatory documentation for home oxygen)  Patient Saturations on Room Air at Rest = 97%  Patient Saturations on Room Air while Ambulating = 93%  No Oxygen needed while ambulating

## 2016-05-16 NOTE — Progress Notes (Signed)
PULMONARY / CRITICAL CARE MEDICINE   Name: [name] MRN: [id number] DOB: 13-Sep-1945    ADMISSION DATE:  05/13/2016 CONSULTATION DATE:  05/14/17    REFERRING MD:  [facility]  CHIEF COMPLAINT:  Sob [name]   HISTORY OF PRESENT ILLNESS:   71 yo [place] immigrant quit smoking 15 y ago gradual worse sob x 6 m eval by Ann Lions 05/12/16 with dx of PF ? Element of copd rx pred/ trelogy and w/u for PF in progress - only took one dose of pred and worsening sob > admit with CTa c/w PF plus AS component with ESR 95 and placed on high dose IV steroids  SUBJECTIVE:  Feeling a little better  VITAL SIGNS: BP 125/63 (BP Location: Left Arm)   Pulse 85   Temp 97.7 F (36.5 C) (Oral)   Resp 18   Ht _0  (1.6 m)   Wt 109 lb 6.4 oz (49.6 kg)   SpO2 97%   BMI 19.38 kg/m  Room air  INTAKE / OUTPUT:  Intake/Output Summary (Last 24 hours) at 05/16/16 1010 Last data filed at 05/16/16 0758  Gross per 24 hour  Intake              240 ml  Output             1070 ml  Net             -830 ml     Wt Readings from Last 3 Encounters:  05/14/16 109 lb 6.4 oz (49.6 kg)  05/12/16 109 lb (49.4 kg)   General appearance:  71 Year old  Male,cachectic  NAD conversant  Eyes: anicteric sclerae moist conjunctivae; PERRL, EOMI bilaterally. Mouth:  membranes and no mucosal ulcerations; normal hard and soft palate Neck: Trachea midline; neck supple, no JVD Lungs/chest: dry velcro crackles posteriorly , with normal respiratory effort and no intercostal retractions CV: RRR, no MRGs  Abdomen: Soft, non-tender; no masses or HSM Extremities: No peripheral edema or extremity lymphadenopathy Skin: Normal temperature, turgor and texture; no rash, ulcers or subcutaneous nodules Psych: Appropriate affect, alert and oriented to person, place and time  LABS:  BMET  Recent Labs Lab 05/13/16 1327 05/14/16 0656 05/15/16 0903  NA 130* 132* 136  K 4.4 4.1 3.9  CL 96* 101 101  CO2 _1 BUN _2 CREATININE 0.98 0.83 0.95  GLUCOSE 123* 158* 196*    Electrolytes  Recent Labs Lab 05/13/16 1327 05/14/16 0656 05/15/16 0903  CALCIUM 9.0 8.3* 8.4*    CBC  Recent Labs Lab 05/13/16 1327 05/14/16 0656 05/15/16 0903  WBC 6.9 7.1 16.6*  HGB 14.0 11.5* 12.3*  HCT 41.0 34.5* 37.3*  PLT 329 290 278    Coag's No results for input(s): APTT, INR in the last 168 hours.  Sepsis Markers No results for input(s): LATICACIDVEN, PROCALCITON, O2SATVEN in the last 168 hours.  ABG  Recent Labs Lab 05/13/16 1650  PHART 7.383  PCO2ART 42.3  PO2ART 75.0*    Liver Enzymes  Recent Labs Lab 05/14/16 0656  AST 16  ALT 10*  ALKPHOS 56  BILITOT 0.4  ALBUMIN 3.0*    Cardiac Enzymes No results for input(s): TROPONINI, PROBNP in the last 168 hours.  Glucose  Recent Labs Lab 05/15/16 1752 05/15/16 2110 05/16/16 0755  GLUCAP 141* 130* 141*    Imaging Dg Chest 2 View  Result Date: 05/16/2016 CLINICAL DATA:  Pneumonitis. EXAM: CHEST  2 VIEW COMPARISON:  CT 05/13/2016.  FINDINGS: Mediastinum and hilar structures stable. Reference is made to chest CT of 05/13/2016 which demonstrated a small pneumomediastinum. Heart size normal. Stable diffuse bilateral interstitial prominence most consistent with chronic interstitial lung disease. Active pneumonitis cannot be excluded . No pleural effusion or pneumothorax. Biapical pleural thickening noted consistent with scarring. Stable lower thoracic/upper lumbar mild compression fracture . IMPRESSION: Stable changes of chronic interstitial lung disease. No acute alveolar infiltrates noted. Electronically Signed   By: Marcello Moores  Register   On: 05/16/2016 07:31  CXR personally reviewed: without significant change    Current ILD diagnostics BNP: 7.5 4/20 HSP>>> 4/20 Aldolase>>>95 4/20 Mpo/pr-3 (anca)antibodies: <1.0 4/20 ANCA screen reflex>>> 4/20 Anticscleroderma antibody: neg 4/20 sjogrens syndrome B extractible nuclear ab: neg 4/20  sjogrens syndrome A extractible nuclear ab: neg 8/33 cyclic citrul peptide ab iGg:neg  4/20 RF: neg 4/20: anti-dna antibody double stranded: neg 4/20 ANA: neg  4/20 sed rate: 95   ASSESSMENT / PLAN:  Acute hypoxic respiratory failure in the setting of progressive ILD ddx" AIP, Hypersensitivity pneumonitis, CTD. To date doubt infectious or lymphangitic carcinomatosis. Also doubt pulmonary edema or DAH.  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   Plan He is now day 3 of IV steroids, would complete today's IV dosing and then change to prednisone 40 mg/daily. Would keep at this dose at d/c and will arrange f/u with our office Repeat walking oximetry in am prior to dc to determine O2 needs (appointment is in computer)  Erick Colace ACNP-BC [place] Pager # [phone] OR # [phone] if no answer  STAFF NOTE: I, Daniel Roof, MD FACP have personally reviewed patient's available data, including medical history, events of note, physical examination and test results as part of my evaluation. I have discussed with resident/NP and other care providers such as pharmacist, RN and RRT. In addition, I personally evaluated patient and elicited key findings of: less SOB at rest, less coarse, pcxr difficult to assess with such cnronicity but clinically better, could in am transition to  67m/kg ( small body wt) daily as he seems to be a steroid responsive, it is clear that a large majority of his symptoms are chornic and burnt out, will need follow up CT chest also, will need to walk pulse ox, will sign off, all pulm office appt done  DLavon Paganini FTitus Mould MD, FMacArthurPgr: 3Rock [facility] ParkP[facility] Care 05/16/2016 11:26 AM

## 2016-05-17 DIAGNOSIS — E43 Unspecified severe protein-calorie malnutrition: Secondary | ICD-10-CM

## 2016-05-17 LAB — GLUCOSE, CAPILLARY
GLUCOSE-CAPILLARY: 131 mg/dL — AB (ref 65–99)
Glucose-Capillary: 201 mg/dL — ABNORMAL HIGH (ref 65–99)

## 2016-05-17 MED ORDER — ENSURE ENLIVE PO LIQD: 237.0000 mL | Freq: Two times a day (BID) | ORAL | 0 refills | Status: AC

## 2016-05-17 MED ORDER — PREDNISONE 20 MG PO TABS: 40.0000 mg | ORAL_TABLET | Freq: Every day | ORAL | 0 refills | Status: AC

## 2016-05-17 MED ORDER — ADULT MULTIVITAMIN W/MINERALS CH: 1.0000 | ORAL_TABLET | Freq: Every day | ORAL | Status: DC

## 2016-05-17 NOTE — Progress Notes (Signed)
Pt d/c to home. PIV removed without complication. Discharge paperwork, reasons to return to ED/MD, prescriptions and follow up appts reviewed with pt and pt's male family member at bedside. AVS given in Spanish and pt confirmed learning with teachback method. Pt escorted off of unit to care of family by volunteer.

## 2016-05-17 NOTE — Progress Notes (Addendum)
Pt with DME order for home oxygen. Pt is without insurance. CM made referral with  [facility] @ [phone]  for home oxygen, charity case. CM to f/u with results. Whitman Hero RN,CM  Pt doesn't qualify nor need  home oxygen per ambulatory saturation qualifications. Pt made aware. Whitman Hero RN,CM

## 2016-05-17 NOTE — Discharge Summary (Signed)
Physician Discharge Summary  [name] WLS:[id number] DOB: [birth year] DOA: 05/13/2016  Pulmonologist Dr. Chase Caller / Lynelle Smoke Parrett NP  Admit date: 05/13/2016 Discharge date: 05/17/2016  Admitted From: Home  Disposition:  Home   Recommendations for Outpatient Follow-up:  1. Follow up with Pulmonology in 1 week as scheduled 2. Please obtain repeat Chest CT outpatient   Equipment/Devices: Home oxygen therapy  Discharge Condition: STABLE  CODE STATUS: FULL   Brief/Interim Summary: [name]  is a 71 y.o. male, With history of pulmonary fibrosis,  hypertension came to hospital with worsening shortness of breath. Patient was recently seen by pulmonary as outpatient for worsening dyspnea. Lab work was done on 05/12/2016 for assessing the severity of interstitial lung disease. As per patient's son usually patient is able to walk around the house but over past 1 day he has not been able to walk at all and is getting short of breath when he tries to get out of bed. He also complains of sharp pleuritic left-sided chest pain that is worse with movement and and palpation. He denies nausea vomiting or diarrhea. He denies fever, no dysuria.  In the ED CTA was done which was negative for pulmonary embolism. It also showed small pneumomediastinum potentially due to rupture of the peripheral left sacral bleb. No evidence of fluid in the mediastinum. ED physician discussed with [facility], and they recommended breathing treatments, steroids and no further treatment for small pneumomediastinum.  Brief Narrative:  71 year old Spanish-speaking male, PMH of GERD, HTN, former heavy smoker, at least 3 months history of mostly nonproductive cough, dyspnea, profound weight loss, 3 weeks history of left sided chest pain mostly with coughing and radiating to back, seen by outpatient pulmonology on 05/12/16 and being evaluated for interstitial lung disease, noted progressive worsening dyspnea with even  minimal exertion or speaking that got worse in the 24 hours prior to admission leading to this admission. 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 Pulmonology following. Improving.   Assessment & Plan:   Active Problems:   Interstitial lung disease (HCC)   Pneumonitis   Protein-calorie malnutrition, severe   1. ILD +/- COPDacerbation: ABG 5/21: PH 7.38, PCO2: 42.3, PO2 75, oxygen saturation 95%. BNP: 7.5. Extensive outpatient workup done by pulmonology (hypersensitivity pneumonitis profile, aldolase, MPO, ANCA, anti-scleroderma antibodies, and SSA/SSB, CCP, rheumatoid factor, anti-DNA antibody, ANA) all pending. ESR 95. Detailed CTA report as below: Advanced subpleural honeycombing bilaterally consistent with UIP as can be seen in cases of IPF, drug toxicity and connective tissue disease; small volume pneumomediastinum, potentially related to rupture of a peripheral tear sac or bleb. 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  Pt to discharge on prednisone 40 mg daily until follow up with pulmonology.  Also they will follow up on his CT scan.   2.  GERD: PPI  3.  Acute respiratory failure with hypoxia: Secondary to problem #1. Will need home oxygen. 4. Small pneumomediastinum: As per CT report as below. Pulmonology consulted. Supportive treatment and monitor.  Need follow-up CT as an outpatient  5. Left-sided chest pain: DD: Musculoskeletal etiology from dry hacking cough versus related to pneumomediastinum. CTA negative for PE. POC troponin  1 negative. CK normal. EKG 4/21, apart from sinus  tachycardia and no acute changes. Resolved. 6. Essential hypertension: resume home meds. 7. Profound weight loss: May be related to problem #1 versus other etiologies. Dietitian consultation. 8. Anemia: Stable. Likely of chronic disease.  9. Hyponatremia:? SIADH. Clinically euvolemic. Resolved. 10. Sinus tachycardia: Intermittent. Asymptomatic. TSH 0.858. Reviewed EKG from 4/22. May be related to hypoxia. Follow 2-D echo. 11. Hyperglycemia: Likely secondary to steroids.  A1c is 5,7.   DVT prophylaxis: Lovenox Code Status: Full Family Communication: Discussed with patient's family at bedside. Disposition: DC home   Consultants:  Pulmonology   Discharge Diagnoses:  Active Problems:   Interstitial lung disease (HCC)   Pneumonitis   Protein-calorie malnutrition, severe   Chest pain on breathing   Pneumomediastinum [facility])  Discharge Instructions  Discharge Instructions    Increase activity slowly    Complete by:  As directed      Allergies as of 05/17/2016      Reactions   Cyclobenzaprine Swelling   Tongue and lip swelling      Medication List    STOP taking these medications   benzonatate 100 MG capsule Commonly known as:  TESSALON   HYDROcodone-acetaminophen 5-325 MG tablet Commonly known as:  NORCO/VICODIN     TAKE these medications   BEVESPI AEROSPHERE 9-4.8 MCG/ACT Aero Generic drug:  Glycopyrrolate-Formoterol Inhale 2 puffs into the lungs 2 (two) times daily.   feeding supplement (ENSURE ENLIVE) Liqd Take 237 mLs by mouth 2 (two) times daily between meals.   Fluticasone-Umeclidin-Vilant 100-62.5-25 MCG/INH Aepb Commonly known as:  TRELEGY ELLIPTA Inhale 1 puff into the lungs daily.   losartan 25 MG tablet Commonly known as:  COZAAR Take 25 mg by mouth daily.   multivitamin with minerals Tabs tablet Take 1 tablet by mouth daily. Start taking on:  05/18/2016   predniSONE 20 MG tablet Commonly known as:  DELTASONE Take 2 tablets (40 mg total) by  mouth daily with breakfast. What changed:  medication strength  how much to take  how to take this  when to take this  additional instructions            Durable Medical Equipment        Start     Ordered   05/16/16 1335  For home use only DME oxygen  Once    Question Answer Comment  Mode or (Route) Nasal cannula   Liters per Minute 2   Frequency Continuous (stationary and portable oxygen unit needed)   Oxygen conserving device Yes   Oxygen delivery system Gas      05/16/16 1335     Follow-up Information    RAMASWAMY,MURALI, MD Follow up on 06/09/2016.   Specialty:  Pulmonary Disease Why:  at 930am Contact information: [place] [address] [phone]        Tammy Parrett, NP Follow up on 05/30/2016.   Specialty:  Pulmonary Disease Why:  930 am Contact information: [address] [address] [phone]          Allergies  Allergen Reactions  . Cyclobenzaprine Swelling    Tongue and lip swelling   Procedures/Studies: Dg Chest 2 View  Result Date: 05/16/2016 CLINICAL DATA:  Pneumonitis. EXAM: CHEST  2 VIEW COMPARISON:  CT 05/13/2016. FINDINGS: Mediastinum and hilar structures stable. Reference is made to chest CT of 05/13/2016 which demonstrated a small pneumomediastinum. Heart size normal. Stable diffuse bilateral interstitial prominence most consistent with chronic interstitial lung disease.  Active pneumonitis cannot be excluded . No pleural effusion or pneumothorax. Biapical pleural thickening noted consistent with scarring. Stable lower thoracic/upper lumbar mild compression fracture . IMPRESSION: Stable changes of chronic interstitial lung disease. No acute alveolar infiltrates noted. Electronically Signed   By: Marcello Moores  Register   On: 05/16/2016 07:31   Dg Chest 2 View  Result Date: 05/03/2016 CLINICAL DATA:  Cough EXAM: CHEST  2 VIEW COMPARISON:  None. FINDINGS: Chronic interstitial markings markings/fibrosis with  bronchiectasis and subpleural reticulation in the lungs bilaterally. While nonspecific, idiopathic pulmonary fibrosis is possible. No superimposed opacities suspicious for pneumonia. No pleural effusion or pneumothorax. The heart is normal in size. Mild degenerative changes of the visualized thoracolumbar spine. Mild lower thoracic kyphotic deformity with associated mild superior endplate changes with loss of height at L1. IMPRESSION: Chronic interstitial markings/fibrosis, nonspecific, but suggesting chronic interstitial lung disease such as idiopathic pulmonary fibrosis. No superimposed opacities suspicious for pneumonia. Electronically Signed   By: Julian Hy M.D.   On: 05/03/2016 12:38   Ct Angio Chest Pe W Or Wo Contrast  Result Date: 05/13/2016 CLINICAL DATA:  Worsening shortness of breath. EXAM: CT ANGIOGRAPHY CHEST WITH CONTRAST TECHNIQUE: Multidetector CT imaging of the chest was performed using the standard protocol during bolus administration of intravenous contrast. Multiplanar CT image reconstructions and MIPs were obtained to evaluate the vascular anatomy. CONTRAST:  80 cc Isovue 370 COMPARISON:  Chest x-ray 05/03/2016 FINDINGS: Cardiovascular: Heart size is upper normal. No pericardial effusion. No thoracic aortic aneurysm. No evidence for dissection of the thoracic aorta. No filling defects in the opacified pulmonary arteries to suggest the presence of acute pulmonary embolus. Mediastinum/Nodes: 13 mm short axis subcarinal lymph node is associated with a 12 mm short axis low paraesophageal lymph node , 12 mm short axis left hilar lymph node and 9 mm short axis right hilar lymph node. The esophagus has normal imaging features. Trace volume of pneumomediastinum is identified in the upper chest near the thoracic inlet (see images 23 - 32 of series 407)). Lungs/Pleura: Biapical pleural-parenchymal scarring is identified. Chronic underlying interstitial lung disease evident with prominent  subpleural honeycombing bilaterally. No pleural effusion. Small areas of confluent airspace disease are noted bilaterally and may be related to active inflammation or infection. Upper Abdomen: Unremarkable. Musculoskeletal: Bone windows reveal no worrisome lytic or sclerotic osseous lesions. Review of the MIP images confirms the above findings. IMPRESSION: 1. 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 2. Small volume pneumomediastinum, potentially related to rupture of a peripheral air sac or bleb. No evidence for fluid in the mediastinum, but correlation for signs/symptoms of esophageal tear suggested. 3. Mediastinal and hilar lymphadenopathy, likely reactive. 4. Scattered small areas of focal confluent airspace disease likely related to the underlying fibrotic process. Airspace infection cannot be entirely excluded. Electronically Signed   By: Misty Stanley M.D.   On: 05/13/2016 16:20   Ct Renal Stone Study  Result Date: 05/03/2016 CLINICAL DATA:  LEFT lower quadrant pain radiating to LEFT flank for 8 days. EXAM: CT ABDOMEN AND PELVIS WITHOUT CONTRAST TECHNIQUE: Multidetector CT imaging of the abdomen and pelvis was performed following the standard protocol without IV contrast. COMPARISON:  None. FINDINGS: Moderate respiratory motion degraded examination. LOWER CHEST: Fibrotic changes lung bases without focal consolidation. Component honeycombing apparent. Heart size is normal. Subcentimeter lymph node adjacent to the thoracic esophagus. No pericardial effusions. The visualized heart size is normal. No  pericardial effusion. HEPATOBILIARY: Normal. PANCREAS: Normal. SPLEEN: Normal. ADRENALS/URINARY TRACT: Kidneys are orthotopic, demonstrating normal size and morphology. No nephrolithiasis, hydronephrosis ; please note, degree of respiratory motion limits detection of small nonobstructing  nephrolithiasis. Limited assessment for renal masses on this nonenhanced examination. The unopacified ureters are normal in course and caliber. Urinary bladder is partially distended and unremarkable. Normal adrenal glands. STOMACH/BOWEL: The stomach, small and large bowel are normal in course and caliber without inflammatory changes, sensitivity decreased by lack of enteric contrast. Stooling gas distended rectum. Normal appendix. VASCULAR/LYMPHATIC: 2.5 cm ectatic infrarenal aorta. Mild calcific atherosclerosis. No lymphadenopathy by CT size criteria. REPRODUCTIVE: Mild prostatomegaly involving base of the bladder. OTHER: No intraperitoneal free fluid or free air. MUSCULOSKELETAL: Non-acute. Bridging sacroiliac osteophytes. Old LEFT L2 transverse process fracture. Old mild T12 compression fracture. IMPRESSION: No urolithiasis, obstructive uropathy nor acute intra-abdominal/pelvic process on this moderately motion degraded examination. Included chest demonstrates fibrosis and honeycombing associated with chronic interstitial lung disease. Recommend CT chest without contrast on a nonemergent basis. Electronically Signed   By: Elon Alas M.D.   On: 05/03/2016 13:53     Subjective: Pt is reporting that he feels better, he is breathing much better.     Discharge Exam: Vitals:   05/16/16 2102 05/17/16 0613  BP: 117/62 122/71  Pulse: (!) 105 86  Resp: 18 18  Temp: 97.6 F (36.4 C) 98.1 F (36.7 C)   Vitals:   05/16/16 2034 05/16/16 2102 05/17/16 0613 05/17/16 0901  BP:  117/62 122/71   Pulse:  (!) 105 86   Resp:  18 18   Temp:  97.6 F (36.4 C) 98.1 F (36.7 C)   TempSrc:  Oral Oral   SpO2: 96% 95% 97% 94%  Weight:      Height:       General exam: Pleasant middle-aged male lying comfortably supine in bed. Frail and chronically ill-looking. Spanish speaking.  Respiratory system: Course, fine crackles, negative accessory muscle, no wheezing . Cardiovascular system: S1, S2 year, nor a  murmur gallops, regular rate and rhythm  Gastrointestinal system: Abdomen soft, nontender nondistended , BS+. Central nervous system: Alert and oriented. No focal neurological deficits. Extremities: Symmetric 5 x 5 power.digital clubbing.  Skin: No rashes, lesions or ulcers Psychiatry: Judgement and insight appear normal. Mood & affect appropriate.   The results of significant diagnostics from this hospitalization (including imaging, microbiology, ancillary and laboratory) are listed below for reference.     Microbiology: No results found for this or any previous visit (from the past 240 hour(s)).   Labs: BNP (last 3 results)  Recent Labs  05/13/16 1512  BNP 7.5   Basic Metabolic Panel:  Recent Labs Lab 05/12/16 1726 05/13/16 1327 05/14/16 0656 05/15/16 0903  NA 130* 130* 132* 136  K 4.8 4.4 4.1 3.9  CL 91* 96* 101 101  CO2 _0 GLUCOSE 99 123* 158* 196*  BUN _1 CREATININE 1.02 0.98 0.83 0.95  CALCIUM 10.2 9.0 8.3* 8.4*   Liver Function Tests:  Recent Labs Lab 05/14/16 0656  AST 16  ALT 10*  ALKPHOS 56  BILITOT 0.4  PROT 6.9  ALBUMIN 3.0*   No results for input(s): LIPASE, AMYLASE in the last 168 hours. No results for input(s): AMMONIA in the last 168 hours. CBC:  Recent Labs Lab 05/13/16 1327 05/14/16 0656 05/15/16 0903  WBC 6.9 7.1 16.6*  HGB 14.0 11.5* 12.3*  HCT 41.0 34.5* 37.3*  MCV 90.5 89.8  91.0  PLT 329 290 278   Cardiac Enzymes:  Recent Labs Lab 05/12/16 1726  CKTOTAL 55  CKMB 1.2   BNP: Invalid input(s): POCBNP CBG:  Recent Labs Lab 05/15/16 2110 05/16/16 0755 05/16/16 1211 05/16/16 1644 05/17/16 0758  GLUCAP 130* 141* 191* 191* 131*   D-Dimer No results for input(s): DDIMER in the last 72 hours. Hgb A1c  Recent Labs  05/15/16 1246  HGBA1C 5.7*   Lipid Profile No results for input(s): CHOL, HDL, LDLCALC, TRIG, CHOLHDL, LDLDIRECT in the last 72 hours. Thyroid function studies  Recent Labs   05/14/16 1627  TSH 0.858   Anemia work up No results for input(s): VITAMINB12, FOLATE, FERRITIN, TIBC, IRON, RETICCTPCT in the last 72 hours. Urinalysis    Component Value Date/Time   COLORURINE YELLOW 05/03/2016 1129   APPEARANCEUR CLEAR 05/03/2016 1129   LABSPEC 1.017 05/03/2016 1129   PHURINE 7.0 05/03/2016 1129   GLUCOSEU NEGATIVE 05/03/2016 1129   HGBUR NEGATIVE 05/03/2016 1129   BILIRUBINUR NEGATIVE 05/03/2016 1129   KETONESUR NEGATIVE 05/03/2016 1129   PROTEINUR NEGATIVE 05/03/2016 1129   NITRITE NEGATIVE 05/03/2016 1129   LEUKOCYTESUR NEGATIVE 05/03/2016 1129   Sepsis Labs Invalid input(s): PROCALCITONIN,  WBC,  LACTICIDVEN Microbiology No results found for this or any previous visit (from the past 240 hour(s)).  Time coordinating discharge: 33 mins  SIGNED:  Irwin Brakeman, MD  [facility] 05/17/2016, 11:03 AM Pager [phone]  If 7PM-7AM, please contact night-coverage [email or web address] Password [email or web address]

## 2016-05-22 LAB — HYPERSENSITIVITY PNUEMONITIS PROFILE

## 2016-05-29 ENCOUNTER — Telehealth: Payer: Self-pay | Admitting: Internal Medicine

## 2016-05-29 NOTE — Telephone Encounter (Signed)
Tammy  You are seeing him 05/30/16 following my first consult for ILD workup. I saw him a late Friday last appt and family was very concerned so he got admitted in ER. PE ruled out . He had some pneumomediastinum. 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 Given male and age > 25 and negative autpimmune (anca screen I would see as false positive) this is IPF  Plan =- do cxr - to ensure pneumomediastinum not worse  - hold off PFT due to pneumomediastinum concern  - give pulmonary fibrosis booklet - talk about IPF  1. f interstitial lung disease  - is really an umbrella term the does not mean anything specific in terms of etiology, prognosis, treatment options without establishing a specific diagnosis 2. Explained varied etiology and varied differential diagnosis such as autoimmune lung disease [ruled out by blood test] and other differential diagnoses that includes diseases that most patients have never heard of. These are NSIP, chronic eosinophilic pneumonitis, hypersensitivity pneumonitis, sarcoidosis, idiopathic pulmonary fibrosis (IPF) which has a genetic component, BOOP/COP, and that smokers specific diagnosis such as RB-ILD, DIP. These are only a few of the many. Of these, explained IPF is the most severe disease which is progressive and fatal with a median survival of 3-7 years. 3. Of these  heas IPF 4.  symptom control possible; for cough with antitussives. For dyspnea with rehabilitation and hypoxemia with oxygen 7. specific treatment -  Pirfenidone  - would be less side effect for hjim but given his short stature would cap it at 2 tab tid , slows progress. You can give them booklet and leave iniitaitno to me at fu  Dr. Brand Males, M.D., [facility].C.P Pulmonary and Critical Care Medicine Staff Physician [facility] Pager: [phone], If no answer or between  15:00h - 7:00h: call [phone]  05/29/2016 7:10 AM

## 2016-05-30 ENCOUNTER — Ambulatory Visit (INDEPENDENT_AMBULATORY_CARE_PROVIDER_SITE_OTHER): Payer: Self-pay | Admitting: Adult Health

## 2016-05-30 ENCOUNTER — Encounter: Payer: Self-pay | Admitting: Adult Health

## 2016-05-30 ENCOUNTER — Ambulatory Visit (INDEPENDENT_AMBULATORY_CARE_PROVIDER_SITE_OTHER)
Admission: RE | Admit: 2016-05-30 | Discharge: 2016-05-30 | Disposition: A | Discharge status: Home / Self Care | Payer: Self-pay | Source: Ambulatory Visit | Attending: Adult Health | Admitting: Adult Health

## 2016-05-30 DIAGNOSIS — J849 Interstitial pulmonary disease, unspecified: Secondary | ICD-10-CM

## 2016-05-30 DIAGNOSIS — J982 Interstitial emphysema: Secondary | ICD-10-CM

## 2016-05-30 MED ORDER — PREDNISONE 10 MG PO TABS: ORAL_TABLET | ORAL | 1 refills | Status: DC

## 2016-05-30 MED ORDER — GLYCOPYRROLATE-FORMOTEROL 9-4.8 MCG/ACT IN AERO: 2.0000 | INHALATION_SPRAY | Freq: Two times a day (BID) | RESPIRATORY_TRACT | 0 refills | Status: AC

## 2016-05-30 NOTE — Progress Notes (Signed)
_0  ID: [name], male    DOB: 1945-12-17, 71 y.o.   MRN: [id number]  Chief Complaint  Patient presents with  . Follow-up    Pulmonary Fibrosis     Referring provider: No ref. provider found  HPI: 71 year old male former smoker with presumed pulmonary fibrosis seen for pulmonary consult 05/12/2016 Patient is Hispanic and does not speak English    05/30/2016 Follow up : Post hospital follow up -patient is accompanied by a a family friend that is interpreting . Patient presents for a two-week follow-up. Patient was seen for a pulmonary consult 05/12/2016 for pulmonary fibrosis workup. She was admitted 05/13/2016 for progressive shortness of breath. Patient underwent a CT chest that was negative for PE but showed a small pneumomediastinum potentially due to rupture of a peripheral bleb. 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 ILD workup showed a negative ANA, anti-DNA antibody, rheumatoid factor, CCP, Sjogren's, scleroderma. C ANCA was mildly positive . 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 -c/w [facility] . ESR 95.  Patient is from [place] and worked in [place] most of his life. 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  Since discharge. Patient says he is feeling some better. Patient was started on prednisone 40 mg daily. Shortness of breath has decreased. Patient does get winded with walking.  Has some dry cough.  Allergies  Allergen Reactions  . Cyclobenzaprine Swelling    Tongue and lip swelling    Immunization History  Administered Date(s) Administered  . Influenza Split 01/24/2016    Past Medical History:  Diagnosis Date  . GERD (gastroesophageal reflux disease)   . Hypertension   . Pulmonary fibrosis (HCC)     Tobacco History: History  Smoking Status  . Former Smoker  . Packs/day: 2.00  . Years: 40.00  . Quit date:  05/03/2001  Smokeless Tobacco  . Never Used   Counseling given: Not Answered   Outpatient Encounter Prescriptions as of 05/30/2016  Medication Sig  . feeding supplement, ENSURE ENLIVE, (ENSURE ENLIVE) LIQD Take 237 mLs by mouth 2 (two) times daily between meals.  Marland Kitchen losartan (COZAAR) 25 MG tablet Take 25 mg by mouth daily.  . predniSONE (DELTASONE) 20 MG tablet Take 2 tablets (40 mg total) by mouth daily with breakfast.  . Glycopyrrolate-Formoterol (BEVESPI AEROSPHERE) 9-4.8 MCG/ACT AERO Inhale 2 puffs into the lungs 2 (two) times daily.   . Multiple Vitamin (MULTIVITAMIN WITH MINERALS) TABS tablet Take 1 tablet by mouth daily. (Patient not taking: Reported on 05/30/2016)  . [DISCONTINUED] Fluticasone-Umeclidin-Vilant (TRELEGY ELLIPTA) 100-62.5-25 MCG/INH AEPB Inhale 1 puff into the lungs daily. (Patient not taking: Reported on 05/30/2016)  . [DISCONTINUED] predniSONE (DELTASONE) 10 MG tablet 4 tabs daily  for 7  days, then 3 tabs daily for 7  days, then hold at 2 tabs daily . (Patient not taking: Reported on 05/30/2016)   No facility-administered encounter medications on file as of 05/30/2016.      Review of Systems  Constitutional:   No  weight loss, night sweats,  Fevers, chills,  +fatigue, or  lassitude.  HEENT:   No headaches,  Difficulty swallowing,  Tooth/dental problems, or  Sore throat,                No sneezing, itching, ear ache, nasal congestion, post nasal drip,   CV:  No chest pain,  Orthopnea, PND, swelling in  lower extremities, anasarca, dizziness, palpitations, syncope.   GI  No heartburn, indigestion, abdominal pain, nausea, vomiting, diarrhea, change in bowel habits, loss of appetite, bloody stools.   Resp:   No chest wall deformity  Skin: no rash or lesions.  GU: no dysuria, change in color of urine, no urgency or frequency.  No flank pain, no hematuria   MS:  No joint pain or swelling.  No decreased range of motion.  No back pain.    Physical Exam  BP 96/64 (BP  Location: Right Arm, Cuff Size: Normal)   Pulse (!) 109   Ht 5' 4.5" (1.638 m)   Wt 113 lb 6.4 oz (51.4 kg)   SpO2 95%   BMI 19.16 kg/m   GEN: A/Ox3; pleasant , NAD, thin frail and elderly    HEENT:  [place]/AT,  EACs-clear, TMs-wnl, NOSE-clear, THROAT-clear, no lesions, no postnasal drip or exudate noted. Poor dentition   NECK:  Supple w/ fair ROM; no JVD; normal carotid impulses w/o bruits; no thyromegaly or nodules palpated; no lymphadenopathy.    RESP  BB crackles  no accessory muscle use, no dullness to percussion  CARD:  RRR, no m/r/g, no peripheral edema, pulses intact, no cyanosis or clubbing.  GI:   Soft & nt; nml bowel sounds; no organomegaly or masses detected.   Musco: Warm bil, no deformities or joint swelling noted.   Neuro: alert, no focal deficits noted.    Skin: Warm, no lesions or rashes    Lab Results:  CBC    Component Value Date/Time   WBC 16.6 (H) 05/15/2016 0903   RBC 4.10 (L) 05/15/2016 0903   HGB 12.3 (L) 05/15/2016 0903   HCT 37.3 (L) 05/15/2016 0903   PLT 278 05/15/2016 0903   MCV 91.0 05/15/2016 0903   MCH 30.0 05/15/2016 0903   MCHC 33.0 05/15/2016 0903   RDW 13.4 05/15/2016 0903    BMET    Component Value Date/Time   NA 136 05/15/2016 0903   K 3.9 05/15/2016 0903   CL 101 05/15/2016 0903   CO2 27 05/15/2016 0903   GLUCOSE 196 (H) 05/15/2016 0903   BUN 13 05/15/2016 0903   CREATININE 0.95 05/15/2016 0903   CALCIUM 8.4 (L) 05/15/2016 0903   GFRNONAA >60 05/15/2016 0903   GFRAA >60 05/15/2016 0903    BNP    Component Value Date/Time   BNP 7.5 05/13/2016 1512    ProBNP No results found for: PROBNP  Imaging: Dg Chest 2 View  Result Date: 05/16/2016 CLINICAL DATA:  Pneumonitis. EXAM: CHEST  2 VIEW COMPARISON:  CT 05/13/2016. FINDINGS: Mediastinum and hilar structures stable. Reference is made to chest CT of 05/13/2016 which demonstrated a small pneumomediastinum. Heart size normal. Stable diffuse bilateral interstitial  prominence most consistent with chronic interstitial lung disease. Active pneumonitis cannot be excluded . No pleural effusion or pneumothorax. Biapical pleural thickening noted consistent with scarring. Stable lower thoracic/upper lumbar mild compression fracture . IMPRESSION: Stable changes of chronic interstitial lung disease. No acute alveolar infiltrates noted. Electronically Signed   By: Marcello Moores  Register   On: 05/16/2016 07:31   Dg Chest 2 View  Result Date: 05/03/2016 CLINICAL DATA:  Cough EXAM: CHEST  2 VIEW COMPARISON:  None. FINDINGS: Chronic interstitial markings markings/fibrosis with bronchiectasis and subpleural reticulation in the lungs bilaterally. While nonspecific, idiopathic pulmonary fibrosis is possible. No superimposed opacities suspicious for pneumonia. No pleural effusion or pneumothorax. The heart is normal in size. Mild degenerative changes of the visualized thoracolumbar spine.  Mild lower thoracic kyphotic deformity with associated mild superior endplate changes with loss of height at L1. IMPRESSION: Chronic interstitial markings/fibrosis, nonspecific, but suggesting chronic interstitial lung disease such as idiopathic pulmonary fibrosis. No superimposed opacities suspicious for pneumonia. Electronically Signed   By: Sriyesh  Krishnan M.D.   On: 05/03/2016 12:38   Ct Angio Chest Pe W Or Wo Contrast  Result Date: 05/13/2016 CLINICAL DATA:  Worsening shortness of breath. EXAM: CT ANGIOGRAPHY CHEST WITH CONTRAST TECHNIQUE: Multidetector CT imaging of the chest was performed using the standard protocol during bolus administration of intravenous contrast. Multiplanar CT image reconstructions and MIPs were obtained to evaluate the vascular anatomy. CONTRAST:  80 cc Isovue 370 COMPARISON:  Chest x-ray 05/03/2016 FINDINGS: Cardiovascular: Heart size is upper normal. No pericardial effusion. No thoracic aortic aneurysm. No evidence for dissection of the thoracic aorta. No filling defects in  the opacified pulmonary arteries to suggest the presence of acute pulmonary embolus. Mediastinum/Nodes: 13 mm short axis subcarinal lymph node is associated with a 12 mm short axis low paraesophageal lymph node , 12 mm short axis left hilar lymph node and 9 mm short axis right hilar lymph node. The esophagus has normal imaging features. Trace volume of pneumomediastinum is identified in the upper chest near the thoracic inlet (see images 23 - 32 of series 407)). Lungs/Pleura: Biapical pleural-parenchymal scarring is identified. Chronic underlying interstitial lung disease evident with prominent subpleural honeycombing bilaterally. No pleural effusion. Small areas of confluent airspace disease are noted bilaterally and may be related to active inflammation or infection. Upper Abdomen: Unremarkable. Musculoskeletal: Bone windows reveal no worrisome lytic or sclerotic osseous lesions. Review of the MIP images confirms the above findings. IMPRESSION: 1. 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 2. Small volume pneumomediastinum, potentially related to rupture of a peripheral air sac or bleb. No evidence for fluid in the mediastinum, but correlation for signs/symptoms of esophageal tear suggested. 3. Mediastinal and hilar lymphadenopathy, likely reactive. 4. Scattered small areas of focal confluent airspace disease likely related to the underlying fibrotic process. Airspace infection cannot be entirely excluded. Electronically Signed   By: Eric  Mansell M.D.   On: 05/13/2016 16:20   Ct Renal Stone Study  Result Date: 05/03/2016 CLINICAL DATA:  LEFT lower quadrant pain radiating to LEFT flank for 8 days. EXAM: CT ABDOMEN AND PELVIS WITHOUT CONTRAST TECHNIQUE: Multidetector CT imaging of the abdomen and pelvis was performed following the standard protocol without IV contrast. COMPARISON:  None.  FINDINGS: Moderate respiratory motion degraded examination. LOWER CHEST: Fibrotic changes lung bases without focal consolidation. Component honeycombing apparent. Heart size is normal. Subcentimeter lymph node adjacent to the thoracic esophagus. No pericardial effusions. The visualized heart size is normal. No pericardial effusion. HEPATOBILIARY: Normal. PANCREAS: Normal. SPLEEN: Normal. ADRENALS/URINARY TRACT: Kidneys are orthotopic, demonstrating normal size and morphology. No nephrolithiasis, hydronephrosis ; please note, degree of respiratory motion limits detection of small nonobstructing nephrolithiasis. Limited assessment for renal masses on this nonenhanced examination. The unopacified ureters are normal in course and caliber. Urinary bladder is partially distended and unremarkable. Normal adrenal glands. STOMACH/BOWEL: The stomach, small and large bowel are normal in course and caliber without inflammatory changes, sensitivity decreased by lack of enteric contrast. Stooling gas distended rectum. Normal appendix. VASCULAR/LYMPHATIC: 2.5 cm ectatic infrarenal aorta. Mild calcific atherosclerosis. No lymphadenopathy by CT size criteria. REPRODUCTIVE: Mild prostatomegaly involving base of the bladder. OTHER: No intraperitoneal free fluid or   free air. MUSCULOSKELETAL: Non-acute. Bridging sacroiliac osteophytes. Old LEFT L2 transverse process fracture. Old mild T12 compression fracture. IMPRESSION: No urolithiasis, obstructive uropathy nor acute intra-abdominal/pelvic process on this moderately motion degraded examination. Included chest demonstrates fibrosis and honeycombing associated with chronic interstitial lung disease. Recommend CT chest without contrast on a nonemergent basis. Electronically Signed   By: Elon Alas M.D.   On: 05/03/2016 13:53     Assessment & Plan:   Pneumomediastinum ([place]) Follow up CXR today  Hold on PFT for now until resolved on CXR.   ILD (interstitial lung disease)  (HCC) Severe fibrosis on CT w/ positive HSP for Faenia Retivirgula -? Chronic [facility] .  Pt is clinically improved on steroids  Will slow taper to 88m daily and hold  Steroid pt education given   Will need PFT when able .   Plan  Patient Instructions  Chest xray today .  Continue on Prednisone 485mdaily for 1 week then 3088maily for 1 week then 79m47mily and hold at this dose.  Follow up with Dr. RamaChase Caller4 weeks and As needed   Please contact office for sooner follow up if symptoms do not improve or worsen or seek emergency care   Radiografa de trax hoy. Contine con prednisona 40 mg al da durante 1 semana y luego 30 mg al da durante 1 semana y luego 20 mg al da y [place] dosis. Haga un seguimiento con el Dr. RamaChase Caller4 semanas y segn sea necesario [place] con la oficina para un seguimiento ms temprano si los sntomas no mejoran o empeoran o buscan atencin de emerFreight forwarder    TammRexene Edison 05/30/2016

## 2016-05-30 NOTE — Assessment & Plan Note (Signed)
Follow up CXR today  Hold on PFT for now until resolved on CXR.

## 2016-05-30 NOTE — Addendum Note (Signed)
Addended by: Parke Poisson E on: 05/30/2016 10:53 AM   Modules accepted: Orders

## 2016-05-30 NOTE — Progress Notes (Signed)
Pt was contacted who stated he did not speak English; call was ended and [employer] were called. Using Izan from [employer] at [phone], a message was left for patient to contact office.

## 2016-05-30 NOTE — Patient Instructions (Addendum)
Chest xray today .  Continue on Prednisone 62m daily for 1 week then 329mdaily for 1 week then 2036maily and hold at this dose.  Follow up with Dr. RamChase Caller 4 weeks and As needed   Please contact office for sooner follow up if symptoms do not improve or worsen or seek emergency care   Radiografa de trax hoy. Contine con prednisona 40 mg al da durante 1 semana y luego 30 mg al da durante 1 semana y luego 20 mg al da y [place] dosis. Haga un seguimiento con el Dr. RamChase Caller 4 semanas y segn sea necesario [place] con la oficina para un seguimiento ms temprano si los sntomas no mejoran o empeoran o buscan atencin de emeFreight forwarder

## 2016-05-30 NOTE — Assessment & Plan Note (Signed)
Severe fibrosis on CT w/ positive HSP for Faenia Retivirgula -? Chronic [facility] .  Pt is clinically improved on steroids  Will slow taper to 54m daily and hold  Steroid pt education given   Will need PFT when able .   Plan  Patient Instructions  Chest xray today .  Continue on Prednisone 430mdaily for 1 week then 3057maily for 1 week then 58m26mily and hold at this dose.  Follow up with Dr. RamaChase Caller4 weeks and As needed   Please contact office for sooner follow up if symptoms do not improve or worsen or seek emergency care   Radiografa de trax hoy. Contine con prednisona 40 mg al da durante 1 semana y luego 30 mg al da durante 1 semana y luego 20 mg al da y [place] dosis. Haga un seguimiento con el Dr. RamaChase Caller4 semanas y segn sea necesario [place] con la oficina para un seguimiento ms temprano si los sntomas no mejoran o empeoran o buscan atencin de emerFreight forwarder

## 2016-06-07 ENCOUNTER — Inpatient Hospital Stay (HOSPITAL_COMMUNITY)
Admission: EM | Admit: 2016-06-07 | Discharge: 2016-06-09 | DRG: 196 | Disposition: A | Discharge status: Home / Self Care | Payer: Self-pay | Attending: Internal Medicine | Admitting: Internal Medicine

## 2016-06-07 ENCOUNTER — Emergency Department (HOSPITAL_COMMUNITY): Payer: Self-pay

## 2016-06-07 ENCOUNTER — Encounter (HOSPITAL_COMMUNITY): Payer: Self-pay | Admitting: *Deleted

## 2016-06-07 ENCOUNTER — Other Ambulatory Visit: Payer: Self-pay

## 2016-06-07 DIAGNOSIS — Z9109 Other allergy status, other than to drugs and biological substances: Secondary | ICD-10-CM

## 2016-06-07 DIAGNOSIS — E43 Unspecified severe protein-calorie malnutrition: Secondary | ICD-10-CM | POA: Diagnosis present

## 2016-06-07 DIAGNOSIS — Z87891 Personal history of nicotine dependence: Secondary | ICD-10-CM

## 2016-06-07 DIAGNOSIS — Z79899 Other long term (current) drug therapy: Secondary | ICD-10-CM

## 2016-06-07 DIAGNOSIS — J670 Farmer's lung: Secondary | ICD-10-CM | POA: Diagnosis present

## 2016-06-07 DIAGNOSIS — R000 Tachycardia, unspecified: Secondary | ICD-10-CM | POA: Diagnosis present

## 2016-06-07 DIAGNOSIS — R0602 Shortness of breath: Secondary | ICD-10-CM

## 2016-06-07 DIAGNOSIS — J849 Interstitial pulmonary disease, unspecified: Secondary | ICD-10-CM | POA: Diagnosis present

## 2016-06-07 DIAGNOSIS — Z560 Unemployment, unspecified: Secondary | ICD-10-CM

## 2016-06-07 DIAGNOSIS — J440 Chronic obstructive pulmonary disease with acute lower respiratory infection: Secondary | ICD-10-CM | POA: Diagnosis present

## 2016-06-07 DIAGNOSIS — J189 Pneumonia, unspecified organism: Secondary | ICD-10-CM | POA: Diagnosis present

## 2016-06-07 DIAGNOSIS — R0603 Acute respiratory distress: Secondary | ICD-10-CM | POA: Diagnosis present

## 2016-06-07 DIAGNOSIS — D649 Anemia, unspecified: Secondary | ICD-10-CM | POA: Insufficient documentation

## 2016-06-07 DIAGNOSIS — Y95 Nosocomial condition: Secondary | ICD-10-CM | POA: Diagnosis present

## 2016-06-07 DIAGNOSIS — R0682 Tachypnea, not elsewhere classified: Secondary | ICD-10-CM

## 2016-06-07 DIAGNOSIS — K219 Gastro-esophageal reflux disease without esophagitis: Secondary | ICD-10-CM | POA: Diagnosis present

## 2016-06-07 DIAGNOSIS — J8410 Pulmonary fibrosis, unspecified: Principal | ICD-10-CM | POA: Diagnosis present

## 2016-06-07 DIAGNOSIS — R6510 Systemic inflammatory response syndrome (SIRS) of non-infectious origin without acute organ dysfunction: Secondary | ICD-10-CM

## 2016-06-07 DIAGNOSIS — R739 Hyperglycemia, unspecified: Secondary | ICD-10-CM | POA: Insufficient documentation

## 2016-06-07 DIAGNOSIS — R079 Chest pain, unspecified: Secondary | ICD-10-CM | POA: Diagnosis present

## 2016-06-07 DIAGNOSIS — Z681 Body mass index (BMI) 19 or less, adult: Secondary | ICD-10-CM

## 2016-06-07 DIAGNOSIS — J84112 Idiopathic pulmonary fibrosis: Secondary | ICD-10-CM | POA: Diagnosis present

## 2016-06-07 DIAGNOSIS — A419 Sepsis, unspecified organism: Secondary | ICD-10-CM | POA: Diagnosis present

## 2016-06-07 DIAGNOSIS — I10 Essential (primary) hypertension: Secondary | ICD-10-CM | POA: Diagnosis present

## 2016-06-07 HISTORY — DX: Hyperglycemia, unspecified: R73.9

## 2016-06-07 LAB — APTT: aPTT: 30 seconds (ref 24–36)

## 2016-06-07 LAB — URINALYSIS, ROUTINE W REFLEX MICROSCOPIC
Bilirubin Urine: NEGATIVE
Glucose, UA: NEGATIVE mg/dL
HGB URINE DIPSTICK: NEGATIVE
Ketones, ur: NEGATIVE mg/dL
Leukocytes, UA: NEGATIVE
Nitrite: NEGATIVE
PROTEIN: NEGATIVE mg/dL
Specific Gravity, Urine: 1.006 (ref 1.005–1.030)
pH: 8 (ref 5.0–8.0)

## 2016-06-07 LAB — CBC
HCT: 42.4 % (ref 39.0–52.0)
HEMOGLOBIN: 14.3 g/dL (ref 13.0–17.0)
MCH: 31 pg (ref 26.0–34.0)
MCHC: 33.7 g/dL (ref 30.0–36.0)
MCV: 92 fL (ref 78.0–100.0)
PLATELETS: 307 10*3/uL (ref 150–400)
RBC: 4.61 MIL/uL (ref 4.22–5.81)
RDW: 15.4 % (ref 11.5–15.5)
WBC: 16.6 10*3/uL — ABNORMAL HIGH (ref 4.0–10.5)

## 2016-06-07 LAB — I-STAT CG4 LACTIC ACID, ED
Lactic Acid, Venous: 1.77 mmol/L (ref 0.5–1.9)
Lactic Acid, Venous: 1.9 mmol/L (ref 0.5–1.9)

## 2016-06-07 LAB — BASIC METABOLIC PANEL
ANION GAP: 9 (ref 5–15)
BUN: 17 mg/dL (ref 6–20)
CALCIUM: 8.8 mg/dL — AB (ref 8.9–10.3)
CO2: 27 mmol/L (ref 22–32)
Chloride: 96 mmol/L — ABNORMAL LOW (ref 101–111)
Creatinine, Ser: 0.95 mg/dL (ref 0.61–1.24)
GFR calc Af Amer: >60 mL/min (ref >60)
Glucose, Bld: 178 mg/dL — ABNORMAL HIGH (ref 65–99)
Potassium: 4.4 mmol/L (ref 3.5–5.1)
Sodium: 132 mmol/L — ABNORMAL LOW (ref 135–145)

## 2016-06-07 LAB — I-STAT TROPONIN, ED: TROPONIN I, POC: 0 ng/mL (ref 0.00–0.08)

## 2016-06-07 LAB — PROTIME-INR
INR: 1.12
Prothrombin Time: 14.4 seconds (ref 11.4–15.2)

## 2016-06-07 LAB — PROCALCITONIN: PROCALCITONIN: 0.1 ng/mL

## 2016-06-07 LAB — STREP PNEUMONIAE URINARY ANTIGEN: STREP PNEUMO URINARY ANTIGEN: NEGATIVE

## 2016-06-07 MED ORDER — LOSARTAN POTASSIUM 50 MG PO TABS
25.0000 mg | ORAL_TABLET | Freq: Every day | ORAL | Status: DC
Start: 2016-06-08 — End: 2016-06-09
  Administered 2016-06-08 – 2016-06-09 (×2): 25 mg via ORAL
  Filled 2016-06-07 (×2): qty 1

## 2016-06-07 MED ORDER — METHYLPREDNISOLONE SODIUM SUCC 125 MG IJ SOLR
60.0000 mg | Freq: Four times a day (QID) | INTRAMUSCULAR | Status: DC
  Administered 2016-06-08 (×3): 60 mg via INTRAVENOUS
  Filled 2016-06-07 (×2): qty 2

## 2016-06-07 MED ORDER — SODIUM CHLORIDE 0.9 % IV BOLUS (SEPSIS)
30.0000 mL/kg | Freq: Once | INTRAVENOUS | Status: AC
  Administered 2016-06-07: 1539 mL via INTRAVENOUS

## 2016-06-07 MED ORDER — METHYLPREDNISOLONE SODIUM SUCC 125 MG IJ SOLR
125.0000 mg | Freq: Once | INTRAMUSCULAR | Status: AC
  Administered 2016-06-07: 125 mg via INTRAVENOUS
  Filled 2016-06-07: qty 2

## 2016-06-07 MED ORDER — ALBUTEROL SULFATE (2.5 MG/3ML) 0.083% IN NEBU
2.5000 mg | INHALATION_SOLUTION | RESPIRATORY_TRACT | Status: AC
  Administered 2016-06-07 – 2016-06-08 (×3): 2.5 mg via RESPIRATORY_TRACT
  Filled 2016-06-07 (×3): qty 3

## 2016-06-07 MED ORDER — METHYLPREDNISOLONE SODIUM SUCC 125 MG IJ SOLR: 60.0000 mg | Freq: Four times a day (QID) | INTRAMUSCULAR | Status: DC

## 2016-06-07 MED ORDER — SODIUM CHLORIDE 0.9 % IV SOLN
INTRAVENOUS | Status: AC
  Administered 2016-06-07: 75 mL/h via INTRAVENOUS
  Administered 2016-06-08: 02:00:00 via INTRAVENOUS

## 2016-06-07 MED ORDER — VANCOMYCIN HCL IN DEXTROSE 1-5 GM/200ML-% IV SOLN
1000.0000 mg | Freq: Once | INTRAVENOUS | Status: AC
  Administered 2016-06-07: 1000 mg via INTRAVENOUS
  Filled 2016-06-07: qty 200

## 2016-06-07 MED ORDER — DEXTROSE 5 % IV SOLN
1.0000 g | Freq: Three times a day (TID) | INTRAVENOUS | Status: DC
  Administered 2016-06-08 (×2): 1 g via INTRAVENOUS
  Filled 2016-06-07 (×5): qty 1

## 2016-06-07 MED ORDER — ACETAMINOPHEN 325 MG PO TABS
650.0000 mg | ORAL_TABLET | Freq: Once | ORAL | Status: AC
  Administered 2016-06-07: 650 mg via ORAL
  Filled 2016-06-07: qty 2

## 2016-06-07 MED ORDER — DEXTROSE 5 % IV SOLN
2.0000 g | Freq: Once | INTRAVENOUS | Status: AC
  Administered 2016-06-07: 2 g via INTRAVENOUS
  Filled 2016-06-07: qty 2

## 2016-06-07 MED ORDER — SODIUM CHLORIDE 0.9 % IV SOLN
500.0000 mg | Freq: Two times a day (BID) | INTRAVENOUS | Status: DC
  Administered 2016-06-08: 500 mg via INTRAVENOUS
  Filled 2016-06-07 (×2): qty 500

## 2016-06-07 NOTE — ED Notes (Signed)
[facility] contacted to activate [facility]

## 2016-06-07 NOTE — ED Provider Notes (Signed)
[facility] DEPT Provider Note   CSN: [id number] Arrival date & time: 06/07/16  1324     History   Chief Complaint No chief complaint on file.   HPI [name] is a 72 y.o. male with a history of pulmonary fibrosis who presents today for chest pain and shortness of breath.  He has a primarily Spanish-speaking individual, has a family friend here who he indicated will serve as his Optometrist. Since last night he has gradually developed worsening chest pain and shortness of breath. He reports that he has a three-day history of a productive cough that he is coughing up clear mucus. He was previously seen by pulmonology on 05/30/16 in the office and started on steroids.  Other than the cough, he denies fevers, nausea, vomiting, abdominal pain.  He was previously admitted on 4/21 (discharge 4/25) for gradually worsening shortness of breath.   Patient has a 40 year smoking history, states that he quit 15 years ago. 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   HPI  Past Medical History:  Diagnosis Date  . GERD (gastroesophageal reflux disease)   . Hypertension   . Pulmonary fibrosis [facility])     Patient Active Problem List   Diagnosis Date Noted  . Acute respiratory distress 06/07/2016  . HCAP (healthcare-associated pneumonia) 06/07/2016  . Sepsis ([place]) 06/07/2016  . Chest pain on breathing   . Pneumomediastinum ([place])   . Protein-calorie malnutrition, severe 05/15/2016  . Pneumonitis   . Interstitial lung disease ([place]) 05/13/2016  . ILD (interstitial lung disease) ([place]) 05/12/2016  . Weight loss, abnormal 05/12/2016  . Atypical chest pain 05/12/2016  . Tachycardia 05/12/2016  . Dyspnea 05/12/2016    Past Surgical History:  Procedure Laterality Date  . EYE SURGERY    . HERNIA REPAIR         Home Medications    Prior to Admission medications   Medication Sig Start Date End Date  Taking? Authorizing Provider  feeding supplement, ENSURE ENLIVE, (ENSURE ENLIVE) LIQD Take 237 mLs by mouth 2 (two) times daily between meals. 05/17/16 06/16/16  Johnson, Clanford L, MD  Glycopyrrolate-Formoterol (BEVESPI AEROSPHERE) 9-4.8 MCG/ACT AERO Inhale 2 puffs into the lungs 2 (two) times daily.     [provider]  losartan (COZAAR) 25 MG tablet Take 25 mg by mouth daily.    [provider]  Multiple Vitamin (MULTIVITAMIN WITH MINERALS) TABS tablet Take 1 tablet by mouth daily. Patient not taking: Reported on 05/30/2016 05/18/16   Murlean Iba, MD    Family History No family history on file.  Social History Social History  Substance Use Topics  . Smoking status: Former Smoker    Packs/day: 2.00    Years: 40.00    Quit date: 05/03/2001  . Smokeless tobacco: Never Used  . Alcohol use No     Allergies   Cyclobenzaprine   Review of Systems Review of Systems  Constitutional: Positive for chills and fever. Negative for appetite change and diaphoresis.  HENT: Negative for congestion, postnasal drip and sore throat.   Eyes: Negative for visual disturbance.  Respiratory: Positive for cough, chest tightness and shortness of breath. Negative for wheezing.   Cardiovascular: Positive for chest pain. Negative for palpitations and leg swelling.  Gastrointestinal: Negative for abdominal pain, diarrhea, nausea and vomiting.  Genitourinary: Negative for difficulty urinating, frequency and urgency.  Musculoskeletal: Positive for back pain. Negative for neck pain.  Skin: Negative for color  change and rash.  Neurological: Positive for weakness. Negative for speech difficulty, light-headedness and headaches.     Physical Exam Updated Vital Signs BP 122/80   Pulse (!) 109   Temp 100 F (37.8 C) (Rectal)   Resp (!) 28   Ht 5' 5" (1.651 m)   Wt 51.3 kg   SpO2 96%   BMI 18.80 kg/m   Physical Exam  Constitutional: He appears well-developed and well-nourished.  He appears ill.  HENT:  Head: Normocephalic and atraumatic.  Right Ear: External ear normal.  Left Ear: External ear normal.  Nose: Nose normal.  Mouth/Throat: Oropharynx is clear and moist.  Eyes: Conjunctivae are normal. Right eye exhibits no discharge. Left eye exhibits no discharge. No scleral icterus.  Neck: Trachea normal and normal range of motion. No JVD present. No tracheal deviation present.  Cardiovascular: Regular rhythm, normal heart sounds and intact distal pulses.  Tachycardia present.   No murmur heard. Pulmonary/Chest: No stridor. Tachypnea noted. He has no decreased breath sounds. He has no wheezes. He has no rhonchi. He has no rales. He exhibits no tenderness.  Supraclavicular retractions.   Abdominal: He exhibits no distension.  Musculoskeletal: He exhibits no edema or deformity.  Neurological: He is alert. He exhibits normal muscle tone.  Skin: Skin is warm and dry. He is not diaphoretic. No pallor.  Psychiatric: He has a normal mood and affect. His behavior is normal.  Nursing note and vitals reviewed.    ED Treatments / Results  Labs (all labs ordered are listed, but only abnormal results are displayed) Labs Reviewed  BASIC METABOLIC PANEL - Abnormal; Notable for the following:       Result Value   Sodium 132 (*)    Chloride 96 (*)    Glucose, Bld 178 (*)    Calcium 8.8 (*)    All other components within normal limits  CBC - Abnormal; Notable for the following:    WBC 16.6 (*)    All other components within normal limits  CULTURE, BLOOD (ROUTINE X 2)  CULTURE, BLOOD (ROUTINE X 2)  I-STAT TROPOININ, ED  I-STAT CG4 LACTIC ACID, ED    EKG  EKG Interpretation None       Radiology Dg Chest Port 1 View  Result Date: 06/07/2016 CLINICAL DATA:  Acute chest pain and shortness of breath today. Patient with pulmonary fibrosis. EXAM: PORTABLE CHEST 1 VIEW COMPARISON:  05/30/2016 and prior radiographs dating back to 05/03/2016. 05/13/2016 chest CT  FINDINGS: The cardiomediastinal silhouette is unchanged. Diffuse interstitial opacities compatible with pulmonary fibrosis again noted. No definite focal airspace disease, pleural effusion, pneumothorax or mass noted. No acute bony abnormalities present. IMPRESSION: No evidence of acute abnormality. Pulmonary fibrosis. Electronically Signed   By: Margarette Canada M.D.   On: 06/07/2016 14:36    Procedures Procedures  CRITICAL CARE Performed by: Wyn Quaker Total critical care time: 31 minutes Critical care time was exclusive of separately billable procedures and treating other patients. Critical care was necessary to treat or prevent imminent or life-threatening deterioration. Critical care was time spent personally by me on the following activities: development of treatment plan with patient and/or surrogate as well as nursing, discussions with consultants, evaluation of patient's response to treatment, examination of patient, obtaining history from patient or surrogate, ordering and performing treatments and interventions, ordering and review of laboratory studies, ordering and review of radiographic studies, pulse oximetry and re-evaluation of patient's condition.  Qualifying criteria sepsis: Two IV antibiotics, Initial HR over 120 (  139)  Medications Ordered in ED Medications  ceFEPIme (MAXIPIME) 2 g in dextrose 5 % 50 mL IVPB (2 g Intravenous New Bag/Given 06/07/16 1508)  vancomycin (VANCOCIN) IVPB 1000 mg/200 mL premix (1,000 mg Intravenous New Bag/Given 06/07/16 1508)  vancomycin (VANCOCIN) 500 mg in sodium chloride 0.9 % 100 mL IVPB (not administered)  ceFEPIme (MAXIPIME) 1 g in dextrose 5 % 50 mL IVPB (not administered)  methylPREDNISolone sodium succinate (SOLU-MEDROL) 125 mg/2 mL injection 125 mg (125 mg Intravenous Given 06/07/16 1509)  acetaminophen (TYLENOL) tablet 650 mg (650 mg Oral Given 06/07/16 1510)  sodium chloride 0.9 % bolus 1,539 mL (1,539 mLs Intravenous New Bag/Given 06/07/16  1512)     Initial Impression / Assessment and Plan / ED Course  I have reviewed the triage vital signs and the nursing notes.  Pertinent labs & imaging results that were available during my care of the patient were reviewed by me and considered in my medical decision making (see chart for details).  Clinical Course as of Jun 07 1525  Wed Jun 07, 2016  1342 Attempted to see patient, not in room  [EH]  1524 Spoke with Madaline Brilliant NP with [facility], she will come see patient.   [EH]    Clinical Course User Index [EH] Lorin Glass, PA-C   Code sepsis activated by Dr. Fredirick Lathe Curt Jews presented with tachycardia, tachypnea concerning for SIRS.  Code sepsis was activated.  Patient has recent history of pneumomediastinum.  Chest x-ray consistent with his baseline pulmonary fibrosis.  Despite no obvious consolidation on CXR concern for HCAP as patient was recently admitted.  Normal lactic with elevated WBC (16.6).  Due to history of pulmonary fibrosis and chart review showing pulmonary started steroids he was given steroid while in the ED.  Patient was also treated with fluid bolus, cefepime, vancomycin while in the ED.  [facility] hospitalist was consulted for admission and will admit the patient.   The patient appears reasonably stabilized for admission considering the current resources, flow, and capabilities available in the ED at this time, and I doubt any other [facility] requiring further screening and/or treatment in the ED prior to admission.    Final Clinical Impressions(s) / ED Diagnoses   Final diagnoses:  SOB (shortness of breath)    New Prescriptions New Prescriptions   No medications on file     [name] 06/07/16 1624    Drenda Freeze, MD 06/08/16 [phone]

## 2016-06-07 NOTE — ED Triage Notes (Signed)
Pt c/o mid Chest pain upon awakening today with SOB, pt denies n/v/d, pt has pulmonary fibrosis, pt hr 130s in triage, pt diaphoretic, A&O x4

## 2016-06-07 NOTE — Plan of Care (Signed)
Paged by flow management no bed request apparently placed: 1) placed bed request for inpatient tele (as indicated in H+P) 2) Placed orders for SCDs (as indicated in H+P) 3) Placed orders for serial trops (also as indicated in H+P)

## 2016-06-07 NOTE — ED Notes (Signed)
Patient's first set of blood cultures drawn at 1442 NOT 1242pm.  Unable to delete entry at 1242pm.

## 2016-06-07 NOTE — H&P (Signed)
History and Physical    [name] FWY:[id number] DOB: 10-07-1945 DOA: 06/07/2016  PCP: Patient, No Pcp Per Patient coming from: home  Chief Complaint: sob/chest pain  HPI: [name] is a  71 y.o. male with medical history significant for pulmonary fibrosis, hypertension, recent pneumonia, hyperglycemia, interstitial lung disease, protein calorie malnutrition since to the emergency Department chief complaint sudden worsening shortness of breath. Initial evaluation reveals tachycardia, tachypnea, leukocytosis, increased oxygen demand, respiratory distress and a chest x-ray concerning for pneumonia.  Information is obtained from the patient via his interpreter who is his grandson. He reports since last night he developed gradual worsening shortness of breath and chest pain. He describes the pain is intermittent dull located left anterior nonradiating. Associated symptoms include subjective fever productive cough with thick clear sputum. He denies headache dizziness syncope or near-syncope. He denies abdominal pain nausea vomiting diarrhea constipation melena. He denies dysuria hematuria frequency or urgency. He reports he has been seen by pulmonology couple weeks ago and was started on steroids.    ED Course: In the emergency department he has a max temp of 100.1 with tachycardia tachypnea, blood pressure stable he is provided with fluid resuscitation and IV antibiotics for healthcare associated pneumonia.  Review of Systems: As per HPI otherwise all other systems reviewed and are negative.   Ambulatory Status: Ambulates with a cane and a walker no recent falls.  Past Medical History:  Diagnosis Date  . GERD (gastroesophageal reflux disease)   . Hyperglycemia   . Hypertension   . Pulmonary fibrosis ([place])     Past Surgical History:  Procedure Laterality Date  . EYE SURGERY    . HERNIA REPAIR      Social History   Social History  . Marital status: Married    Spouse name: N/A  . Number of children: N/A  . Years of education: N/A   Occupational History  . Not on file.   Social History Main Topics  . Smoking status: Former Smoker    Packs/day: 2.00    Years: 40.00    Quit date: 05/03/2001  . Smokeless tobacco: Never Used  . Alcohol use No  . Drug use: No  . Sexual activity: Not on file   Other Topics Concern  . Not on file   Social History Narrative   Lives with others.   Unemployed       Allergies  Allergen Reactions  . Cyclobenzaprine Swelling    Tongue and lip swelling    No family history on file.  Prior to Admission medications   Medication Sig Start Date End Date Taking? Authorizing Provider  feeding supplement, ENSURE ENLIVE, (ENSURE ENLIVE) LIQD Take 237 mLs by mouth 2 (two) times daily between meals. 05/17/16 06/16/16 Yes Johnson, Clanford L, MD  Glycopyrrolate-Formoterol (BEVESPI AEROSPHERE) 9-4.8 MCG/ACT AERO Inhale 2 puffs into the lungs 2 (two) times daily.    Yes [provider]  losartan (COZAAR) 25 MG tablet Take 25 mg by mouth daily.   Yes [provider]    Physical Exam: Vitals:   06/07/16 1500 06/07/16 1515 06/07/16 1530 06/07/16 1545  BP: 115/73 122/80 126/71 121/75  Pulse:  (!) 109 (!) 114 (!) 113  Resp:  (!) 28 19 (!) 22  Temp:      TempSrc:      SpO2:  96% 96% 97%  Weight:      Height:         General:  Appears calm and comfortable, in no  acute distress, appears chronically ill Eyes:  PERRL, EOMI, normal lids, iris ENT:  grossly normal hearing, lips & tongue, his membranes of his mouth are pink slightly dry Neck:  no LAD, masses or thyromegaly Cardiovascular:  Tachycardic but regular, no m/r/g. No LE edema.  Respiratory:  Mild increased work of breathing with conversation breath sounds are quite diminished throughout her no wheeze no crackles Abdomen:  soft, ntnd, NABS Skin:  no rash or induration seen on limited exam Musculoskeletal:  grossly normal tone BUE/BLE, good  ROM, no bony abnormality Psychiatric:  grossly normal mood and affect, speech fluent and appropriate, AOx3 Neurologic:  CN 2-12 grossly intact, moves all extremities in coordinated fashion, sensation intact  Labs on Admission: I have personally reviewed following labs and imaging studies  CBC:  Recent Labs Lab 06/07/16 1330  WBC 16.6*  HGB 14.3  HCT 42.4  MCV 92.0  PLT 299   Basic Metabolic Panel:  Recent Labs Lab 06/07/16 1330  NA 132*  K 4.4  CL 96*  CO2 27  GLUCOSE 178*  BUN 17  CREATININE 0.95  CALCIUM 8.8*   GFR: Estimated Creatinine Clearance: 53.3 mL/min (by C-G formula based on SCr of 0.95 mg/dL). Liver Function Tests: No results for input(s): AST, ALT, ALKPHOS, BILITOT, PROT, ALBUMIN in the last 168 hours. No results for input(s): LIPASE, AMYLASE in the last 168 hours. No results for input(s): AMMONIA in the last 168 hours. Coagulation Profile: No results for input(s): INR, PROTIME in the last 168 hours. Cardiac Enzymes: No results for input(s): CKTOTAL, CKMB, CKMBINDEX, TROPONINI in the last 168 hours. BNP (last 3 results) No results for input(s): PROBNP in the last 8760 hours. HbA1C: No results for input(s): HGBA1C in the last 72 hours. CBG: No results for input(s): GLUCAP in the last 168 hours. Lipid Profile: No results for input(s): CHOL, HDL, LDLCALC, TRIG, CHOLHDL, LDLDIRECT in the last 72 hours. Thyroid Function Tests: No results for input(s): TSH, T4TOTAL, FREET4, T3FREE, THYROIDAB in the last 72 hours. Anemia Panel: No results for input(s): VITAMINB12, FOLATE, FERRITIN, TIBC, IRON, RETICCTPCT in the last 72 hours. Urine analysis:    Component Value Date/Time   COLORURINE STRAW (A) 06/07/2016 1550   APPEARANCEUR CLEAR 06/07/2016 1550   LABSPEC 1.006 06/07/2016 1550   PHURINE 8.0 06/07/2016 1550   GLUCOSEU NEGATIVE 06/07/2016 1550   HGBUR NEGATIVE 06/07/2016 1550   BILIRUBINUR NEGATIVE 06/07/2016 [place] 06/07/2016 1550     PROTEINUR NEGATIVE 06/07/2016 1550   NITRITE NEGATIVE 06/07/2016 1550   LEUKOCYTESUR NEGATIVE 06/07/2016 1550    Creatinine Clearance: Estimated Creatinine Clearance: 53.3 mL/min (by C-G formula based on SCr of 0.95 mg/dL).  Sepsis Labs: _0 (procalcitonin:4,lacticidven:4) )No results found for this or any previous visit (from the past 240 hour(s)).   Radiological Exams on Admission: Dg Chest Port 1 View  Result Date: 06/07/2016 CLINICAL DATA:  Acute chest pain and shortness of breath today. Patient with pulmonary fibrosis. EXAM: PORTABLE CHEST 1 VIEW COMPARISON:  05/30/2016 and prior radiographs dating back to 05/03/2016. 05/13/2016 chest CT FINDINGS: The cardiomediastinal silhouette is unchanged. Diffuse interstitial opacities compatible with pulmonary fibrosis again noted. No definite focal airspace disease, pleural effusion, pneumothorax or mass noted. No acute bony abnormalities present. IMPRESSION: No evidence of acute abnormality. Pulmonary fibrosis. Electronically Signed   By: Margarette Canada M.D.   On: 06/07/2016 14:36    EKG: Independently reviewed. Sinus tachycardia Right atrial enlargement Nonspecific ST and T wave abnormality  Assessment/Plan Principal Problem:  Acute respiratory distress Active Problems:   ILD (interstitial lung disease) (HCC)   Atypical chest pain   Tachycardia   Dyspnea   Protein-calorie malnutrition, severe   HCAP (healthcare-associated pneumonia)   Sepsis ([place])   HTN (hypertension)    #1. Acute respiratory distress likely related to healthcare associated pneumonia in the setting of interstitial lung disease. Patient is not on home oxygen. Chest x-ray feels pulmonary fibrosis but no acute abnormality. Oxygen saturation level greater than 90% on 2 L nasal cannula. He received antibiotics and nebulizers in the emergency department as well as steroids. Respiratory effort improved on admission -Admit to telemetry -Continue oxygen  supplementation -Monitor oxygen saturation level -Scheduled nebs -Solu-Medrol -Follow blood cultures -Sputum culture -Antibiotics per protocol -Wean oxygen as able  #2. Healthcare associated pneumonia. See #1. Concern for pneumonia. Chest xray no acute abnormalities.  Leukocytosis however patient has been on steroids recently. Low-grade temperature. Lactic acid within the limits of normal hemodynamically stable some tachycardia and tachypnea. He received IV fluids in the emergency department as well as antibiotics and is improved on admission -Into the IV fluids -Antibiotics per protocol -Sputum culture as able -Follow blood cultures -Strep pneumo urine antigen -Nebulizers -low threshold to stop antibiotics after 24 hour  #3. Sepsis. Related to above. Tachycardia tachypnea leukocytosis fever increased oxygen demand. Lactic acid within the normal limit -Follow blood cultures -Continue IV fluids -Track lactic acid -Antibiotics as noted above -Monitor  #4. Interstitial lung disease/COPD. Chart review indicates recently hospitalized with respiratory distress related to interstitial lung disease and or COPD exacerbation. Note indicates extensive outpatient workup done by pulmonology. Evaluated by pulmonology as outpatient. It appears home oxygen is in the works. He was discharged on prednisone 40 mg daily to be continued until follow-up with pulmonology. Office note reflects severe fibrosis on CT with positive HSV question chronic Farmer's lung. Planned a slow steroid taper. -see above -If no improvement consider pulm consult  #5. Chest pain. Atypical. Likely related to above. History of same. Initial troponin negative. -Cycle troponin -Serial EKG -Monitor on telemetry    DVT prophylaxis: scd  Code Status: full  Family Communication: grandson at bedside  Disposition Plan: home  Consults called: none  Admission status: inpatient    Radene Gunning MD [facility]  If  7PM-7AM, please contact night-coverage [email or web address] Password [email or web address]  06/07/2016, 4:11 PM

## 2016-06-07 NOTE — Progress Notes (Signed)
Pharmacy Antibiotic Note  [name] is a 71 y.o. male admitted on 06/07/2016 with pneumonia.  Pharmacy has been consulted for vancomycin and cefepime dosing. Tmax is 100 and WBC is elevated at 16.6. SCr is WNL.   Plan: Vancomycin 1gm IV x 1 then 52m IV Q12H Cefepime 2gm IV x 1 then 1gm IV Q8H F/u renal fxn, C&S, clinical status and trough at SS  Height: _0  (165.1 cm) Weight: 113 lb (51.3 kg) IBW/kg (Calculated) : 61.5  Temp (24hrs), Avg:99.1 F (37.3 C), Min:98.2 F (36.8 C), Max:100 F (37.8 C)   Recent Labs Lab 06/07/16 1330  WBC 16.6*  CREATININE 0.95    Estimated Creatinine Clearance: 53.3 mL/min (by C-G formula based on SCr of 0.95 mg/dL).    Allergies  Allergen Reactions  . Cyclobenzaprine Swelling    Tongue and lip swelling    Antimicrobials this admission: Vanc 5/16>> Cefepime 5/16>>  Dose adjustments this admission: N/A  Microbiology results: Pending  Thank you for allowing pharmacy to be a part of this patient's care.  Jurnie Garritano, RRande Lawman5/16/2018 2:39 PM

## 2016-06-08 DIAGNOSIS — J670 Farmer's lung: Secondary | ICD-10-CM

## 2016-06-08 DIAGNOSIS — R0902 Hypoxemia: Secondary | ICD-10-CM

## 2016-06-08 DIAGNOSIS — E46 Unspecified protein-calorie malnutrition: Secondary | ICD-10-CM

## 2016-06-08 DIAGNOSIS — J84112 Idiopathic pulmonary fibrosis: Secondary | ICD-10-CM

## 2016-06-08 LAB — CBC
HCT: 37.1 % — ABNORMAL LOW (ref 39.0–52.0)
HEMOGLOBIN: 12.3 g/dL — AB (ref 13.0–17.0)
MCH: 30.3 pg (ref 26.0–34.0)
MCHC: 33.2 g/dL (ref 30.0–36.0)
MCV: 91.4 fL (ref 78.0–100.0)
PLATELETS: 271 10*3/uL (ref 150–400)
RBC: 4.06 MIL/uL — AB (ref 4.22–5.81)
RDW: 15.6 % — ABNORMAL HIGH (ref 11.5–15.5)
WBC: 14.1 10*3/uL — AB (ref 4.0–10.5)

## 2016-06-08 LAB — COMPREHENSIVE METABOLIC PANEL
ALK PHOS: 46 U/L (ref 38–126)
ALT: 18 U/L (ref 17–63)
AST: 24 U/L (ref 15–41)
Albumin: 2.5 g/dL — ABNORMAL LOW (ref 3.5–5.0)
Anion gap: 8 (ref 5–15)
BUN: 11 mg/dL (ref 6–20)
CALCIUM: 8.4 mg/dL — AB (ref 8.9–10.3)
CHLORIDE: 101 mmol/L (ref 101–111)
CO2: 26 mmol/L (ref 22–32)
Creatinine, Ser: 0.8 mg/dL (ref 0.61–1.24)
GFR calc Af Amer: >60 mL/min (ref >60)
GFR calc non Af Amer: >60 mL/min (ref >60)
Glucose, Bld: 211 mg/dL — ABNORMAL HIGH (ref 65–99)
Potassium: 4 mmol/L (ref 3.5–5.1)
SODIUM: 135 mmol/L (ref 135–145)
Total Bilirubin: 0.6 mg/dL (ref 0.3–1.2)
Total Protein: 5.6 g/dL — ABNORMAL LOW (ref 6.5–8.1)

## 2016-06-08 LAB — HIV ANTIBODY (ROUTINE TESTING W REFLEX): HIV Screen 4th Generation wRfx: NONREACTIVE

## 2016-06-08 LAB — TROPONIN I
Troponin I: <0.03 ng/mL (ref <0.03)
Troponin I: <0.03 ng/mL (ref <0.03)
Troponin I: <0.03 ng/mL (ref <0.03)

## 2016-06-08 LAB — GLUCOSE, CAPILLARY
GLUCOSE-CAPILLARY: 168 mg/dL — AB (ref 65–99)
Glucose-Capillary: 222 mg/dL — ABNORMAL HIGH (ref 65–99)

## 2016-06-08 MED ORDER — METHYLPREDNISOLONE SODIUM SUCC 125 MG IJ SOLR
INTRAMUSCULAR | Status: AC
  Filled 2016-06-08: qty 2

## 2016-06-08 MED ORDER — PREDNISONE 20 MG PO TABS
40.0000 mg | ORAL_TABLET | Freq: Every day | ORAL | Status: DC
  Administered 2016-06-09: 40 mg via ORAL
  Filled 2016-06-08: qty 2

## 2016-06-08 MED ORDER — INSULIN ASPART 100 UNIT/ML ~~LOC~~ SOLN
0.0000 [IU] | Freq: Three times a day (TID) | SUBCUTANEOUS | Status: DC
Start: 2016-06-08 — End: 2016-06-09
  Administered 2016-06-08: 2 [IU] via SUBCUTANEOUS
  Administered 2016-06-09: 1 [IU] via SUBCUTANEOUS

## 2016-06-08 MED ORDER — ENSURE ENLIVE PO LIQD
237.0000 mL | Freq: Two times a day (BID) | ORAL | Status: DC
  Administered 2016-06-08 – 2016-06-09 (×2): 237 mL via ORAL

## 2016-06-08 NOTE — Consult Note (Addendum)
Name: [name] MRN: [id number] DOB: 1945-04-15    ADMISSION DATE:  06/07/2016 CONSULTATION DATE: 5/17  REFERRING MD : [facility]  CHIEF COMPLAINT:  SOB/CP  BRIEF PATIENT DESCRIPTION: WNWD male NAD at rest  SIGNIFICANT EVENTS  5/16 sob/cp  STUDIES:     HISTORY OF PRESENT ILLNESS:  [name] is a 71 yo Hispanic gentleman, followed by Dr. 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  He presents to [facility] with CC: of mid sternal cp, no radiation, no increase with deep breath ot to palpitation. Further complains of SOB, without fever, chills , sweats of radiographic changes. He was tachycardic, no documented desaturation on room air.   Started on Abx per IM and PCCM asked to evaluation. He is awake and alert, on room air with sats 97%. I have asked RN to ambulate on room air and document lowest sat for possible O2 needs. He is in ni distress during examination.  PAST MEDICAL HISTORY :   has a past medical history of GERD (gastroesophageal reflux disease); Hyperglycemia; Hypertension; and Pulmonary fibrosis ([place]).  has a past surgical history that includes Hernia repair and Eye surgery. Prior to Admission medications   Medication Sig Start Date End Date Taking? Authorizing Provider  feeding supplement, ENSURE ENLIVE, (ENSURE ENLIVE) LIQD Take 237 mLs by mouth 2 (two) times daily between meals. 05/17/16 06/16/16 Yes Johnson, Clanford L, MD  Glycopyrrolate-Formoterol (BEVESPI AEROSPHERE) 9-4.8 MCG/ACT AERO Inhale 2 puffs into the lungs 2 (two) times daily.    Yes [provider]  losartan (COZAAR) 25 MG tablet Take 25 mg by mouth daily.   Yes [provider]  predniSONE (DELTASONE) 10 MG tablet Take 20-40 mg by mouth See admin instructions. 40 mg once a day for 7 days then 30 mg once a day for 7 days then hold at 20 mg once a day   Yes  [provider]   Allergies  Allergen Reactions  . Cyclobenzaprine Swelling    Tongue and lips swell    FAMILY HISTORY:  family history is not on file. SOCIAL HISTORY:  reports that he quit smoking about 15 years ago. He has a 80.00 pack-year smoking history. He has never used smokeless tobacco. He reports that he does not drink alcohol or use drugs.  REVIEW OF SYSTEMS:   10 point review of system taken, please see HPI for positives and negatives. Per family  SUBJECTIVE:   VITAL SIGNS: Temp:  [97.5 F (36.4 C)-100 F (37.8 C)] 98.1 F (36.7 C) (05/17 0442) Pulse Rate:  [90-139] 99 (05/17 0442) Resp:  [17-45] 18 (05/17 0442) BP: (105-133)/(64-84) 125/72 (05/17 0442) SpO2:  [94 %-100 %] 100 % (05/17 0442) Weight:  [113 lb (51.3 kg)-116 lb 6.4 oz (52.8 kg)] 116 lb 6.4 oz (52.8 kg) (05/16 2350)  PHYSICAL EXAMINATION: General: WNWD male in NAD. Lying in bed on room air. Neuro: Awake and alert, follows commands, does not speak english HEENT:  Poor dentition , no jvd/lan Cardiovascular:  HSR RRR Lungs: decreased bs Abdomen: soft + bs Musculoskeletal:  Intact Skin: Warm and dry   Recent Labs Lab 06/07/16 1330 06/08/16 0429  NA 132* 135  K 4.4 4.0  CL 96* 101  CO2 27 26  BUN 17 11  CREATININE 0.95 0.80  GLUCOSE 178* 211*    Recent Labs Lab 06/07/16 1330 06/08/16 0429  HGB  14.3 12.3*  HCT 42.4 37.1*  WBC 16.6* 14.1*  PLT 307 271   Dg Chest Port 1 View  Result Date: 06/07/2016 CLINICAL DATA:  Acute chest pain and shortness of breath today. Patient with pulmonary fibrosis. EXAM: PORTABLE CHEST 1 VIEW COMPARISON:  05/30/2016 and prior radiographs dating back to 05/03/2016. 05/13/2016 chest CT FINDINGS: The cardiomediastinal silhouette is unchanged. Diffuse interstitial opacities compatible with pulmonary fibrosis again noted. No definite focal airspace disease, pleural effusion, pneumothorax or mass noted. No acute bony abnormalities present. IMPRESSION:  No evidence of acute abnormality. Pulmonary fibrosis. Electronically Signed   By: Margarette Canada M.D.   On: 06/07/2016 14:36    ASSESSMENT:  Principal Problem:   Acute respiratory distress Active Problems:   ILD (interstitial lung disease) (HCC) Pulmonary fibrosis ? O2 dependent on steroids.   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  He presents to [facility] with CC: of mid sternal cp, no radiation, no increase with deep breath ot to palpitation. Further complains of SOB, without fever, chills , sweats of radiographic changes. He was tachycardic, no documented desaturation on room air.   Started on Abx per IM and PCCM asked to evaluation. He is awake and alert, on room air with sats 97%. I have asked RN to ambulate on room air and document lowest sat for possible O2 needs. He is in ni distress during examination.   PLAN: Wean steroids to prednisone Check ambulatory O2 sats and document for possible home O2 needs. Low threshold to DC abx Follow up with Pulmonary as opt.    Richardson Landry Cervantes ACNP [name] PCCM Pager [phone] till 3 pm If no answer page [phone] 06/08/2016, 11:55 AM  Attending Note:  71 year old male with IPF and question of farmer's lung history being followed by Dr. Chase Caller as outpatient on steroids who presents with SOB.  On exam, bibasilar crackles noted.  I reviewed CT myself, IPF noted.  Discussed with [facility]-MD and PCCM-NP.  IPF:  - Wean steroids to prednisone at home dose  - F/U with Ramaswamy as outpatient  Hypoxemia:  - Titrate O2 for sat of 88-92%  - Ambulatory desaturation  study prior to discharge inorder to see if O2 is noted  Deconditioning:  - PT evaluation  - Ambulate  Malnutrition:    - Nutritionist consult  - Insure is taking home insure  ?HCAP  - Check procalcitonin, if negative then would recommend D/C of abx.  PCCM will be available PRN  Patient seen and examined, agree with above note.  I dictated the care and orders written for this patient under my direction.  Rush Farmer, [place]

## 2016-06-08 NOTE — Progress Notes (Signed)
During walking oxygen saturation, patients pulse increased to 140's and o2 sats. continued to decrease after about 100 feet. Pt. Also stated feeling short of breath, and began sweating. Patient was then taken back to room and pulse returned to baseline and o2 sats returned to 94%

## 2016-06-08 NOTE — Evaluation (Signed)
Physical Therapy Evaluation Patient Details Name: [name] MRN: [id number] DOB: [birth year] Today's Date: 06/08/2016   History of Present Illness  [name] is a  71 y.o. male with medical history significant for pulmonary fibrosis, hypertension, recent pneumonia, hyperglycemia, interstitial lung disease, protein calorie malnutrition since to the emergency Department chief complaint sudden worsening shortness of breath. Initial evaluation reveals tachycardia, tachypnea, leukocytosis, increased oxygen demand, respiratory distress and a chest x-ray concerning for pneumonia  Clinical Impression  Pt is close to baseline functioning and should be safe at home with family assist. There are no further acute PT needs.  Will sign off at this time.     Follow Up Recommendations No PT follow up;Supervision - Intermittent    Equipment Recommendations  None recommended by PT    Recommendations for Other Services       Precautions / Restrictions Precautions Precautions: Fall      Mobility  Bed Mobility Overal bed mobility: Modified Independent                Transfers Overall transfer level: Needs assistance   Transfers: Sit to/from Stand Sit to Stand: Modified independent (Device/Increase time)            Ambulation/Gait Ambulation/Gait assistance: Supervision Ambulation Distance (Feet): 200 Feet Assistive device: None Gait Pattern/deviations: Step-through pattern   Gait velocity interpretation: at or above normal speed for age/gender General Gait Details: generally steady, able to scan whole environment, walk backwards and change direction quickly.  Pt sats during ambulation dropped to low of 89/90 with quick rise to 94% on RA.  EHR though rose to 130's.  pt able to discen the fast HR.  Stairs            Wheelchair Mobility    Modified Rankin (Stroke Patients Only)       Balance Overall balance assessment: No apparent balance  deficits (not formally assessed)                                           Pertinent Vitals/Pain Pain Assessment: No/denies pain    Home Living Family/patient expects to be discharged to:: Private residence Living Arrangements: Children Available Help at Discharge: Family;Available 24 hours/day Type of Home: House Home Access: Ramped entrance     Home Layout: One level Home Equipment:  (TBA)      Prior Function Level of Independence: Independent         Comments: independent in the home, gets out with family.  Does not use RW, cane      Hand Dominance        Extremity/Trunk Assessment   Upper Extremity Assessment Upper Extremity Assessment: Overall WFL for tasks assessed    Lower Extremity Assessment Lower Extremity Assessment: Overall WFL for tasks assessed       Communication   Communication: Prefers language other than [place];Interpreter utilized  Cognition Arousal/Alertness: Awake/alert Behavior During Therapy: WFL for tasks assessed/performed Overall Cognitive Status: Within Functional Limits for tasks assessed                                        General Comments      Exercises     Assessment/Plan    PT Assessment Patent does not need any further PT services  PT  Problem List Decreased activity tolerance;Cardiopulmonary status limiting activity       PT Treatment Interventions      PT Goals (Current goals can be found in the Care Plan section)  Acute Rehab PT Goals PT Goal Formulation: All assessment and education complete, DC therapy    Frequency     Barriers to discharge        Co-evaluation               AM-PAC PT "6 Clicks" Daily Activity  Outcome Measure Difficulty turning over in bed (including adjusting bedclothes, sheets and blankets)?: A Little Difficulty moving from lying on back to sitting on the side of the bed? : A Little Difficulty sitting down on and standing up from a  chair with arms (e.g., wheelchair, bedside commode, etc,.)?: None Help needed moving to and from a bed to chair (including a wheelchair)?: A Little Help needed walking in hospital room?: A Little Help needed climbing 3-5 steps with a railing? : A Little 6 Click Score: 19    End of Session   Activity Tolerance: Patient tolerated treatment well Patient left: in bed;with call bell/phone within reach;with family/visitor present Nurse Communication: Mobility status PT Visit Diagnosis: Other abnormalities of gait and mobility (R26.89)    Time: 1173-5670 PT Time Calculation (min) (ACUTE ONLY): 28 min   Charges:   PT Evaluation $PT Eval Moderate Complexity: 1 Procedure PT Treatments $Gait Training: 8-22 mins   PT G Codes:        07-01-2016  Donnella Sham, PT [phone]  (pager)  Tessie Fass Ramsay Bognar 07-01-16, 5:54 PM

## 2016-06-08 NOTE — Progress Notes (Signed)
[facility] PROGRESS NOTE  [name] JXB:[id number] DOB: [birth year] DOA: 06/07/2016 PCP: Patient, No Pcp Per  Interim summary and history of present illness 71 year old male with a past medical history significant for pulmonary fibrosis, hypertension, interstitial lung disease, severe protein calorie malnutrition, and COPD; who presented to the emergency department secondary to increased shortness of breath, respiratory distress and increased oxygen demand. Patient was recently admitted secondary to pneumonia and exacerbation and his IPF/COPD. He is actively followed by Dr. Chase Caller as an outpatient (pulmonologist).  Assessment/Plan: 1-acute respiratory failure with hypoxia: Most likely associated with his interstitial lung disease and pulmonary fibrosis. -No sepsis -Patient is afebrile and endorses no cough -Mild elevation of his WBCs secondary to use of steroids -There is no abnormalities suggesting acute infection on his x-ray and patient Procalcitonin is 0.10 -Will stop antibiotics -Continue steroids, but will transition to oral prednisone -Assess oxygen needs, especially with activity. -Appreciate pulmonary service consultation and inputs  2-atypical chest discomfort -Most likely secondary to his ILD/IPF -Troponin negative 3 -No acute ischemic changes on EKG -Will use PPI; as per his discomfort for her also be secondary to reflux.  3-hyperglycemia -Triggered by steroids -Recent A1c 5.7 -while inpatient will use sliding scale insulin  4-Severe protein calorie malnutrition -Will start ensure twice a day  -Nutritional service has been consulted   5-hypertension -Will continue Cozaar.  Code Status: Full code Family Communication: No family at bedside Disposition Plan: Will assess oxygen needs and ambulation, stop antibiotics, transition steroids to oral prednisone. If patient remains stable will discharge home in a.m.   Consultants:  Pulmonary  service  Procedures:  See below for x-ray reports  Antibiotics:  Vancomycin and cefepime 06/07/16>>>06/08/16  HPI/Subjective: Overall feeling better, denying chest pain or palpitations. Significant improvement in his breathing. Patient is afebrile and at rest no requiring any oxygen supplementation.  Objective: Vitals:   06/08/16 0442 06/08/16 1427  BP: 125/72 119/69  Pulse: 99 (!) 116  Resp: 18 17  Temp: 98.1 F (36.7 C) 98.3 F (36.8 C)    Intake/Output Summary (Last 24 hours) at 06/08/16 1530 Last data filed at 06/08/16 1111  Gross per 24 hour  Intake             2349 ml  Output                0 ml  Net             2349 ml   Filed Weights   06/07/16 1329 06/07/16 2350  Weight: 51.3 kg (113 lb) 52.8 kg (116 lb 6.4 oz)    Exam:   General: Is afebrile, no chest pain, no palpitations, breathing more comfortable and not requiring oxygen supplementation at rest.  Cardiovascular: Mild tachycardia, no rubs, no gallops and no murmurs  Respiratory: scattered rhonchi, no crackles, no frank wheezing appreciated on exam.  Abdomen: soft, nondistended, no tenderness on palpation, positive bowel sounds  Musculoskeletal: No edema, no cyanosis, no clubbing.  Data Reviewed: Basic Metabolic Panel:  Recent Labs Lab 06/07/16 1330 06/08/16 0429  NA 132* 135  K 4.4 4.0  CL 96* 101  CO2 27 26  GLUCOSE 178* 211*  BUN 17 11  CREATININE 0.95 0.80  CALCIUM 8.8* 8.4*   Liver Function Tests:  Recent Labs Lab 06/08/16 0429  AST 24  ALT 18  ALKPHOS 46  BILITOT 0.6  PROT 5.6*  ALBUMIN 2.5*   CBC:  Recent Labs Lab 06/07/16 1330 06/08/16 0429  WBC 16.6* 14.1*  HGB 14.3 12.3*  HCT 42.4 37.1*  MCV 92.0 91.4  PLT 307 271   Cardiac Enzymes:  Recent Labs Lab 06/07/16 2323 06/08/16 0429 06/08/16 1007  TROPONINI <0.03 <0.03 <0.03   BNP (last 3 results)  Recent Labs  05/13/16 1512  BNP 7.5   CBG: No results for input(s): GLUCAP in the last 168  hours.  Recent Results (from the past 240 hour(s))  Blood culture (routine x 2)     Status: None (Preliminary result)   Collection Time: 06/07/16  2:36 PM  Result Value Ref Range Status   Specimen Description BLOOD LEFT FOREARM  Final   Special Requests   Final    BOTTLES DRAWN AEROBIC AND ANAEROBIC Blood Culture adequate volume   Culture NO GROWTH < 24 HOURS  Final   Report Status PENDING  Incomplete  Blood culture (routine x 2)     Status: None (Preliminary result)   Collection Time: 06/07/16  2:41 PM  Result Value Ref Range Status   Specimen Description BLOOD RIGHT FOREARM  Final   Special Requests   Final    BOTTLES DRAWN AEROBIC AND ANAEROBIC Blood Culture adequate volume   Culture NO GROWTH < 24 HOURS  Final   Report Status PENDING  Incomplete     Studies: Dg Chest Port 1 View  Result Date: 06/07/2016 CLINICAL DATA:  Acute chest pain and shortness of breath today. Patient with pulmonary fibrosis. EXAM: PORTABLE CHEST 1 VIEW COMPARISON:  05/30/2016 and prior radiographs dating back to 05/03/2016. 05/13/2016 chest CT FINDINGS: The cardiomediastinal silhouette is unchanged. Diffuse interstitial opacities compatible with pulmonary fibrosis again noted. No definite focal airspace disease, pleural effusion, pneumothorax or mass noted. No acute bony abnormalities present. IMPRESSION: No evidence of acute abnormality. Pulmonary fibrosis. Electronically Signed   By: Margarette Canada M.D.   On: 06/07/2016 14:36    Scheduled Meds: . feeding supplement (ENSURE ENLIVE)  237 mL Oral BID BM  . losartan  25 mg Oral Daily  . [START ON 06/09/2016] predniSONE  40 mg Oral Q breakfast   Continuous Infusions:    Time spent: 25 minutes    Barton Dubois  [facility] Pager [phone]. If 7PM-7AM, please contact night-coverage at [email or web address], password [facility] 06/08/2016, 3:30 PM  LOS: 1 day

## 2016-06-08 NOTE — Progress Notes (Signed)
Inpatient Diabetes Program Recommendations  AACE/ADA: New Consensus Statement on Inpatient Glycemic Control (2015)  Target Ranges:  Prepandial:   less than 140 mg/dL      Peak postprandial:   less than 180 mg/dL (1-2 hours)      Critically ill patients:  140 - 180 mg/dL  Results for NAYTHAN, DOUTHIT (MRN [id number]) as of 06/08/2016 09:46  Ref. Range 06/07/2016 13:30 06/08/2016 04:29  Glucose Latest Ref Range: 65 - 99 mg/dL 178 (H) 211 (H)  Results for BEHR, CISLO (MRN [id number]) as of 06/08/2016 09:46  Ref. Range 05/15/2016 12:46  Hemoglobin A1C Latest Ref Range: 4.8 - 5.6 % 5.7 (H)   Review of Glycemic Control  Diabetes history: No Outpatient Diabetes medications: NA Current orders for Inpatient glycemic control: None  Inpatient Diabetes Program Recommendations: Correction (SSI): While inpatient and ordered steroids, please consider ordering CBGs with Novolog correction scale ACHS. HgbA1C: A1C 5.7% on 05/15/16.  Thanks, Barnie Alderman, RN, MSN, CDE Diabetes Coordinator Inpatient Diabetes Program [phone] (Team Pager from 8am to 5pm)

## 2016-06-08 NOTE — Care Management Note (Addendum)
Case Management Note  Patient Details  Name: [name] MRN: [id number] Date of Birth: 08-04-1945  Subjective/Objective:              Admitted with respiratory distress. Pt speaks spanish, no english. Yolanda Bonine [name] states pt resides with wife. Pt independent  with ADL's and no DME usage PTA. Pt with PCP and no insurance.   PCP:  [facility]/ Dr.Johnson ( pt with hospital f/u appointment to address PCP and medication needs( pt with insurance), refer to [facility].  Pt has a Pulmonologist and will f/u @ d/c.  Action/Plan: CM provided pt with [place] information  and explained to granddaughter @ bedside. Pt made aware of post hospital  f/u appointment which granddaughter stated they are familiar with clinic. She stated her grandmother, pt's wife, is active with the [facility].  Plan is to d/c to home today. Family to provide transportation to home.  Expected Discharge Date:    06/09/2016              Expected Discharge Plan:   home , self care  In-House Referral:     Discharge planning Services     Post Acute Care Choice:    Choice offered to:     DME Arranged:    DME Agency:     HH Arranged:    HH Agency:     Status of Service:   completed  If discussed at [employer] of Stay Meetings, dates discussed:    Additional Comments:  Sharin Mons, RN 06/08/2016, 1:53 PM

## 2016-06-08 NOTE — Progress Notes (Signed)
SATURATION QUALIFICATIONS: (This note is used to comply with regulatory documentation for home oxygen)  Patient Saturations on Room Air at Rest = 96%  Patient Saturations on Room Air while Ambulating = 90%  Please briefly explain why patient needs home oxygen: n/a

## 2016-06-08 NOTE — Progress Notes (Signed)
Initial Nutrition Assessment  DOCUMENTATION CODES:   Severe malnutrition in context of chronic illness  INTERVENTION:   -Continue Ensure Enlive po BID, each supplement provides 350 kcal and 20 grams of protein  NUTRITION DIAGNOSIS:   Malnutrition (severe) related to chronic illness (pulmonary fibrosis, interstitial lung disease) as evidenced by severe depletion of body fat, severe depletion of muscle mass.  GOAL:   Patient will meet greater than or equal to 90% of their needs  MONITOR:   PO intake, Supplement acceptance, Labs, Weight trends, Skin, I & O's  REASON FOR ASSESSMENT:   Malnutrition Screening Tool, Consult Assessment of nutrition requirement/status  ASSESSMENT:   [name] is a  71 y.o. male with medical history significant for pulmonary fibrosis, hypertension, recent pneumonia, hyperglycemia, interstitial lung disease, protein calorie malnutrition since to the emergency Department chief complaint sudden worsening shortness of breath.   Pt admitted with acute respiratory distress related to HCAP.   Spoke with pt and grandson at bedside. Both confirm that pt had a good appetite both PTA and currently. Pt generally consumes 3 meals per day, consisting of meat, starch, and vegetables. Pt reports that he has been consuming a lot of meat, such as chicken and beef. He consumed 80% of his breakfast per doc flowsheets; both pt and grandson confirmed eating the majority of his breakfast meal.   Both deny pt has lost weight. Reviewed wt hx, which reveals a 7# (6.4%) wt gain over the past month, which is favorable given pt's severe malnutrition.   Pt reports that he enjoys the vanilla Ensure Enlive supplements and usually consumes them 2-3 times daily. He showed this RD a picture of an Ensure Enlive supplement on his phone and reports he drinks them daily, but has not received once since he has been in the hospital.   Nutrition-Focused physical exam completed.  Findings are severe fat depletion, severe muscle depletion, and no edema.   Discussed importance of continued good meal and supplement intake to promote healing. Encouraged pt continue to drink Ensure Enlive supplements at home.   Labs reviewed.  Diet Order:  Diet heart healthy/carb modified Room service appropriate? Yes; Fluid consistency: Thin  Skin:  Reviewed, no issues  Last BM:  PTA  Height:   Ht Readings from Last 1 Encounters:  06/07/16 5' 4" (1.626 m)    Weight:   Wt Readings from Last 1 Encounters:  06/07/16 116 lb 6.4 oz (52.8 kg)    Ideal Body Weight:  59.1 kg  BMI:  Body mass index is 19.98 kg/m.  Estimated Nutritional Needs:   Kcal:  1650-1850  Protein:  80-95 grams  Fluid:  1.6-1.8 L  EDUCATION NEEDS:   Education needs addressed  Julienne Vogler A. Jimmye Norman, RD, LDN, CDE Pager: [phone] After hours Pager: [phone]

## 2016-06-08 NOTE — Progress Notes (Signed)
Patient received in unit via bed. Patient is alert /oriented x4. Vital signs are stable.skin checked with another nurse and looks good.patient is on iv infusion,antiobiotic,scds and telemetry.patient given instruction about call light and phone. Bed in low position and side rail up 2x.

## 2016-06-09 ENCOUNTER — Ambulatory Visit: Payer: Self-pay | Admitting: Internal Medicine

## 2016-06-09 DIAGNOSIS — R079 Chest pain, unspecified: Secondary | ICD-10-CM

## 2016-06-09 DIAGNOSIS — R0602 Shortness of breath: Secondary | ICD-10-CM

## 2016-06-09 LAB — CBC
HCT: 39.6 % (ref 39.0–52.0)
HEMOGLOBIN: 13.4 g/dL (ref 13.0–17.0)
MCH: 30.9 pg (ref 26.0–34.0)
MCHC: 33.8 g/dL (ref 30.0–36.0)
MCV: 91.5 fL (ref 78.0–100.0)
Platelets: 316 10*3/uL (ref 150–400)
RBC: 4.33 MIL/uL (ref 4.22–5.81)
RDW: 15.6 % — ABNORMAL HIGH (ref 11.5–15.5)
WBC: 22.4 10*3/uL — ABNORMAL HIGH (ref 4.0–10.5)

## 2016-06-09 LAB — BASIC METABOLIC PANEL
ANION GAP: 9 (ref 5–15)
BUN: 15 mg/dL (ref 6–20)
CHLORIDE: 101 mmol/L (ref 101–111)
CO2: 27 mmol/L (ref 22–32)
Calcium: 8.8 mg/dL — ABNORMAL LOW (ref 8.9–10.3)
Creatinine, Ser: 0.8 mg/dL (ref 0.61–1.24)
GFR calc non Af Amer: >60 mL/min (ref >60)
Glucose, Bld: 145 mg/dL — ABNORMAL HIGH (ref 65–99)
POTASSIUM: 4.3 mmol/L (ref 3.5–5.1)
Sodium: 137 mmol/L (ref 135–145)

## 2016-06-09 LAB — GLUCOSE, CAPILLARY
GLUCOSE-CAPILLARY: 121 mg/dL — AB (ref 65–99)
GLUCOSE-CAPILLARY: 116 mg/dL — AB (ref 65–99)
Glucose-Capillary: 143 mg/dL — ABNORMAL HIGH (ref 65–99)

## 2016-06-09 MED ORDER — OMEPRAZOLE 40 MG PO CPDR: 40.0000 mg | DELAYED_RELEASE_CAPSULE | Freq: Every day | ORAL | 1 refills | Status: DC

## 2016-06-09 NOTE — Discharge Summary (Addendum)
Physician Discharge Summary  [name] ZJI:[id number] DOB: 20-Sep-1945 DOA: 06/07/2016  PCP: Patient, No Pcp Per  Admit date: 06/07/2016 Discharge date: 06/09/2016  Time spent: 35 minutes  Recommendations for Outpatient Follow-up:  Repeat basic metabolic to follow electrolytes and renal function Reassess Patient's oxygen supplementation needs   Discharge Diagnoses:  Principal Problem:   Acute respiratory distress with hypoxia on exertion   ILD (interstitial lung disease) (HCC)   Chest pain   Tachycardia   SOB (shortness of breath)   Protein-calorie malnutrition, severe   HTN (hypertension)   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 At discharge no requiring any oxygen supplementation, no complaining of chest pain and with significant improvement of his shortness of breath. Patient will follow-up with Dr. Chase Caller as previously instructed. Case manager has also arrange for him to establish care and follow-up at the outpatient community [facility] wellness clinic.  Diet recommendation: Heart healthy diet  Filed Weights   06/07/16 1329 06/07/16 2350  Weight: 51.3 kg (113 lb) 52.8 kg (116 lb 6.4 oz)    Brief History of present illness:  71 year old male with a past medical history significant for pulmonary fibrosis, hypertension, interstitial lung disease, severe protein calorie malnutrition, and COPD; who presented to the emergency department secondary to increased shortness of breath, respiratory distress and increased oxygen demand. Patient was recently admitted secondary to pneumonia and exacerbation and his IPF/COPD. He is actively followed by Dr. Chase Caller as an outpatient (pulmonologist).  Hospital Course:  1-acute respiratory distress: Most likely associated with his interstitial lung disease bronchospasm and pulmonary fibrosis. This was transient and mainly appreciated on admission with exertion.  -No sepsis. No PNA. -Mild elevation of his  WBCs secondary to use of steroids -No further antibiotics were provided. Patient with low procalcitonin level, no abnormalities on CXR, afebrile, no productive cough and with his resp distress triggered by underlying lungs condition and bronchospasm most likely.  -Continue steroids, transitioned back to oral prednisone. Patient will follow tapering instructions as indicated by pulmonary service. Has upcoming appointment follow-up with Dr. Chase Caller. -Oxygen needs assessed, especially with activity; patient demonstrated no requiring oxygen supplementation prior to discharge. -Appreciate pulmonary service consultation and inputs  2-atypical chest discomfort -Most likely secondary to his ILD/IPF VS due to GERD -Troponin negative 3 -No acute ischemic changes on EKG or telemetry -Will discharge on daily use of PPI; as per his discomfort report, active use of the steroids and prior history, could also be secondary to reflux.  3-hyperglycemia -Triggered by steroids -Recent A1c 5.7 -Patient will continue tapering as indicated by pulmonary service and has been instructed to watch amount of carbohydrates in his diet.  4-Severe protein calorie malnutrition -Patient has been discharged on ensure twice a day  5-hypertension -Will continue Cozaar. -Heart healthy diet encouraged  Procedures: See below for x-ray reports  Consultations:  Pulmonary service  Discharge Exam: Vitals:   06/09/16 0710 06/09/16 1346  BP: 114/71 (!) 104/58  Pulse: 85 (!) 107  Resp: 17 18  Temp: 97.8 F (36.6 C)     General: Afebrile, no chest pain, no palpitations, breathing a lot much better and currently demonstrating no need for oxygen supplementation. Patient denies any nausea, vomiting, abdominal pain or any other acute complaints.  Cardiovascular:  RRR, no JVD, no rubs, no gallops, no murmurs.   Respiratory:  scattered rhonchi, no wheezing, no crackles, improved for movement.   Abdomen:  soft,  nontender, nondistended, positive bowel sounds  Musculoskeletal: No edema,  no cyanosis, no clubbing; patient with full range of motion.  Discharge Instructions   Discharge Instructions    Diet - low sodium heart healthy    Complete by:  As directed    Discharge instructions    Complete by:  As directed    Take medications as prescribed  Follow heart healthy diet  Increase activity gradually Please follow up with pulmonary service as instructed  Follow the use of ensure and maintain adequate hydration     Current Discharge Medication List    START taking these medications   Details  omeprazole (PRILOSEC) 40 MG capsule Take 1 capsule (40 mg total) by mouth daily. Qty: 30 capsule, Refills: 1      CONTINUE these medications which have NOT CHANGED   Details  feeding supplement, ENSURE ENLIVE, (ENSURE ENLIVE) LIQD Take 237 mLs by mouth 2 (two) times daily between meals. Qty: 14220 mL, Refills: 0    Glycopyrrolate-Formoterol (BEVESPI AEROSPHERE) 9-4.8 MCG/ACT AERO Inhale 2 puffs into the lungs 2 (two) times daily.     losartan (COZAAR) 25 MG tablet Take 25 mg by mouth daily.    predniSONE (DELTASONE) 10 MG tablet Take 20-40 mg by mouth See admin instructions. 40 mg once a day for 7 days then 30 mg once a day for 7 days then hold at 20 mg once a day       Allergies  Allergen Reactions  . Cyclobenzaprine Swelling    Tongue and lips swell   Follow-up Information    [facility]. Go on 06/20/2016.   Why:  post hospital follow up scheduled for 06/20/2016 at 2:30 pm with Dr. Barbera Setters information: [place]  [id number] [phone]           The results of significant diagnostics from this hospitalization (including imaging, microbiology, ancillary and laboratory) are listed below for reference.    Significant Diagnostic Studies: Dg Chest 2 View  Result Date: 05/30/2016 CLINICAL DATA:  71 year old  male with pulmonary fibrosis, hypertension, history of prior smoking currently without complaints. Subsequent encounter. EXAM: CHEST  2 VIEW COMPARISON:  05/16/2016 chest x-ray.  05/13/2016 chest CT. FINDINGS: Pulmonary fibrosis similar to the prior exam. Small volume pneumomediastinum noted on chest CT not well delineated by plain film. Lucency adjacent and left lateral aspect the upper trachea felt be related to esophagus and unchanged. CT detected mediastinal and hilar adenopathy not well delineated on plain film exam. Heart size within normal limits. No acute osseous abnormality. IMPRESSION: Pulmonary fibrosis similar to the prior exam. Small volume pneumomediastinum noted on chest CT not well delineated by plain film. Lucency adjacent and left lateral aspect the upper trachea felt be related to esophagus and unchanged. CT detected mediastinal and hilar adenopathy not well delineated on plain film exam. Electronically Signed   By: Genia Del M.D.   On: 05/30/2016 11:15   Dg Chest 2 View  Result Date: 05/16/2016 CLINICAL DATA:  Pneumonitis. EXAM: CHEST  2 VIEW COMPARISON:  CT 05/13/2016. FINDINGS: Mediastinum and hilar structures stable. Reference is made to chest CT of 05/13/2016 which demonstrated a small pneumomediastinum. Heart size normal. Stable diffuse bilateral interstitial prominence most consistent with chronic interstitial lung disease. Active pneumonitis cannot be excluded . No pleural effusion or pneumothorax. Biapical pleural thickening noted consistent with scarring. Stable lower thoracic/upper lumbar mild compression fracture . IMPRESSION: Stable changes of chronic interstitial lung disease. No acute alveolar infiltrates noted. Electronically Signed   By: Marcello Moores  Register   On: 05/16/2016 07:31   Ct Angio Chest Pe W Or Wo Contrast  Result Date: 05/13/2016 CLINICAL DATA:  Worsening shortness of breath. EXAM: CT ANGIOGRAPHY CHEST WITH CONTRAST TECHNIQUE: Multidetector CT imaging of the  chest was performed using the standard protocol during bolus administration of intravenous contrast. Multiplanar CT image reconstructions and MIPs were obtained to evaluate the vascular anatomy. CONTRAST:  80 cc Isovue 370 COMPARISON:  Chest x-ray 05/03/2016 FINDINGS: Cardiovascular: Heart size is upper normal. No pericardial effusion. No thoracic aortic aneurysm. No evidence for dissection of the thoracic aorta. No filling defects in the opacified pulmonary arteries to suggest the presence of acute pulmonary embolus. Mediastinum/Nodes: 13 mm short axis subcarinal lymph node is associated with a 12 mm short axis low paraesophageal lymph node , 12 mm short axis left hilar lymph node and 9 mm short axis right hilar lymph node. The esophagus has normal imaging features. Trace volume of pneumomediastinum is identified in the upper chest near the thoracic inlet (see images 23 - 32 of series 407)). Lungs/Pleura: Biapical pleural-parenchymal scarring is identified. Chronic underlying interstitial lung disease evident with prominent subpleural honeycombing bilaterally. No pleural effusion. Small areas of confluent airspace disease are noted bilaterally and may be related to active inflammation or infection. Upper Abdomen: Unremarkable. Musculoskeletal: Bone windows reveal no worrisome lytic or sclerotic osseous lesions. Review of the MIP images confirms the above findings. IMPRESSION: 1. 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 2. Small volume pneumomediastinum, potentially related to rupture of a peripheral air sac or bleb. No evidence for fluid in the mediastinum, but correlation for signs/symptoms of esophageal tear suggested. 3. Mediastinal and hilar lymphadenopathy, likely reactive. 4. Scattered small areas of focal confluent airspace disease likely related to the underlying fibrotic process.  Airspace infection cannot be entirely excluded. Electronically Signed   By: Misty Stanley M.D.   On: 05/13/2016 16:20   Dg Chest Port 1 View  Result Date: 06/07/2016 CLINICAL DATA:  Acute chest pain and shortness of breath today. Patient with pulmonary fibrosis. EXAM: PORTABLE CHEST 1 VIEW COMPARISON:  05/30/2016 and prior radiographs dating back to 05/03/2016. 05/13/2016 chest CT FINDINGS: The cardiomediastinal silhouette is unchanged. Diffuse interstitial opacities compatible with pulmonary fibrosis again noted. No definite focal airspace disease, pleural effusion, pneumothorax or mass noted. No acute bony abnormalities present. IMPRESSION: No evidence of acute abnormality. Pulmonary fibrosis. Electronically Signed   By: Margarette Canada M.D.   On: 06/07/2016 14:36    Microbiology: Recent Results (from the past 240 hour(s))  Blood culture (routine x 2)     Status: None (Preliminary result)   Collection Time: 06/07/16  2:36 PM  Result Value Ref Range Status   Specimen Description BLOOD LEFT FOREARM  Final   Special Requests   Final    BOTTLES DRAWN AEROBIC AND ANAEROBIC Blood Culture adequate volume   Culture NO GROWTH 2 DAYS  Final   Report Status PENDING  Incomplete  Blood culture (routine x 2)     Status: None (Preliminary result)   Collection Time: 06/07/16  2:41 PM  Result Value Ref Range Status   Specimen Description BLOOD RIGHT FOREARM  Final   Special Requests   Final    BOTTLES DRAWN AEROBIC AND ANAEROBIC Blood Culture adequate volume   Culture NO GROWTH 2 DAYS  Final   Report Status PENDING  Incomplete     Labs: Basic Metabolic  Panel:  Recent Labs Lab 06/07/16 1330 06/08/16 0429 06/09/16 0644  NA 132* 135 137  K 4.4 4.0 4.3  CL 96* 101 101  CO2 _0 GLUCOSE 178* 211* 145*  BUN _1 CREATININE 0.95 0.80 0.80  CALCIUM 8.8* 8.4* 8.8*   Liver Function Tests:  Recent Labs Lab 06/08/16 0429  AST 24  ALT 18  ALKPHOS 46  BILITOT 0.6  PROT 5.6*  ALBUMIN  2.5*   CBC:  Recent Labs Lab 06/07/16 1330 06/08/16 0429 06/09/16 0644  WBC 16.6* 14.1* 22.4*  HGB 14.3 12.3* 13.4  HCT 42.4 37.1* 39.6  MCV 92.0 91.4 91.5  PLT 307 271 316   Cardiac Enzymes:  Recent Labs Lab 06/07/16 2323 06/08/16 0429 06/08/16 1007  TROPONINI <0.03 <0.03 <0.03   BNP: BNP (last 3 results)  Recent Labs  05/13/16 1512  BNP 7.5    CBG:  Recent Labs Lab 06/08/16 1720 06/08/16 2159 06/09/16 0800 06/09/16 1252  GLUCAP 168* 222* 121* 116*     Signed:  Barton Dubois MD.  [facility] 06/09/2016, 2:55 PM

## 2016-06-09 NOTE — Progress Notes (Signed)
Pt given discharge instructions, prescriptions, and care notes. Pt verbalized understanding AEB no further questions or concerns at this time. IV was discontinued, no redness, pain, or swelling noted at this time. Telemetry discontinued and Centralized Telemetry was notified. Pt left the floor via wheelchair with staff in stable condition. 

## 2016-06-12 LAB — CULTURE, BLOOD (ROUTINE X 2)
CULTURE: NO GROWTH
Culture: NO GROWTH
Special Requests: ADEQUATE
Special Requests: ADEQUATE

## 2016-06-20 ENCOUNTER — Telehealth: Payer: Self-pay

## 2016-06-20 ENCOUNTER — Encounter: Payer: Self-pay | Admitting: Internal Medicine

## 2016-06-20 ENCOUNTER — Ambulatory Visit: Payer: Self-pay | Attending: Internal Medicine | Admitting: Internal Medicine

## 2016-06-20 VITALS — BP 137/85 | HR 113 | Temp 97.6°F | Resp 16 | Wt 118.0 lb

## 2016-06-20 DIAGNOSIS — J8410 Pulmonary fibrosis, unspecified: Secondary | ICD-10-CM | POA: Insufficient documentation

## 2016-06-20 DIAGNOSIS — Z87891 Personal history of nicotine dependence: Secondary | ICD-10-CM | POA: Insufficient documentation

## 2016-06-20 DIAGNOSIS — D649 Anemia, unspecified: Secondary | ICD-10-CM | POA: Insufficient documentation

## 2016-06-20 DIAGNOSIS — J849 Interstitial pulmonary disease, unspecified: Secondary | ICD-10-CM

## 2016-06-20 DIAGNOSIS — R000 Tachycardia, unspecified: Secondary | ICD-10-CM

## 2016-06-20 DIAGNOSIS — Z1159 Encounter for screening for other viral diseases: Secondary | ICD-10-CM

## 2016-06-20 DIAGNOSIS — R7303 Prediabetes: Secondary | ICD-10-CM

## 2016-06-20 DIAGNOSIS — I10 Essential (primary) hypertension: Secondary | ICD-10-CM

## 2016-06-20 DIAGNOSIS — E43 Unspecified severe protein-calorie malnutrition: Secondary | ICD-10-CM | POA: Insufficient documentation

## 2016-06-20 DIAGNOSIS — J670 Farmer's lung: Secondary | ICD-10-CM | POA: Insufficient documentation

## 2016-06-20 DIAGNOSIS — Z23 Encounter for immunization: Secondary | ICD-10-CM

## 2016-06-20 MED ORDER — PREDNISONE 10 MG PO TABS: ORAL_TABLET | ORAL | 0 refills | Status: DC

## 2016-06-20 MED ORDER — OMEPRAZOLE 40 MG PO CPDR: 40.0000 mg | DELAYED_RELEASE_CAPSULE | Freq: Every day | ORAL | 6 refills | Status: AC

## 2016-06-20 MED ORDER — CALCIUM CARBONATE-VITAMIN D 500-200 MG-UNIT PO TABS: 1.0000 | ORAL_TABLET | Freq: Two times a day (BID) | ORAL | 3 refills | Status: AC

## 2016-06-20 MED ORDER — LOSARTAN POTASSIUM 25 MG PO TABS: 25.0000 mg | ORAL_TABLET | Freq: Every day | ORAL | 6 refills | Status: DC

## 2016-06-20 MED FILL — LOSARTAN POTASSIUM 25 MG TA: 25 | 30 days supply | Qty: 30 | Fill #0

## 2016-06-20 MED FILL — ?PREDNISONE 10 MG TABLET: 10 | 7 days supply | Qty: 14 | Fill #0

## 2016-06-20 MED FILL — OMEPRAZOLE DR 40 MG CAPSULE: 40 | 30 days supply | Qty: 30 | Fill #0

## 2016-06-20 NOTE — Telephone Encounter (Signed)
Call placed to [facility] at the request of Dr Wynetta Emery to check on the patient's eligibility for home O2 as he does not have any insurance.   This CM spoke to [facility] who stated that they do not provide O2 to patients free of charge. The cost for the home  O2  Concentrator, E -tanks with cart and regulator is approximately $250-300/month.  She stated that they do not accept the [facility] as payment for the O2. She also noted that she was not sure if the [facility] would cover the cost of O2.    Update provided to Dr Wynetta Emery.

## 2016-06-20 NOTE — Patient Instructions (Addendum)
Vacuna antineumoccica conjugada (PCV13): lo que debe saber (Pneumococcal Conjugate Vaccine [PCV13] What You Need to Know) 1. Por qu vacunarse? La vacunacin puede proteger a los nios y a los adultos de la enfermedad neumoccica. La enfermedad neumoccica es causada por una bacteria que puede contagiarse de [name] persona a otra por contacto directo. Puede provocar infecciones en los odos y tambin infecciones ms graves en:  los pulmones (neumona),  la sangre (bacteriemia), y  las membranas que cubren el cerebro y la mdula espinal (meningitis). La neumona neumoccica es ms frecuente en los adultos. La meningitis neumoccica puede causar sordera y dao cerebral, y [name] a aproximadamente 1 de cada 10 nios que contraen la enfermedad. Cualquier persona puede Emergency planning/management officer enfermedad neumoccica, pero los nios menores de 2 aos y los adultos mayores de 60 aos, las personas con determinadas enfermedades y los fumadores son los que corren un mayor riesgo. Antes de que existiera una vacuna, cada ao, en [place] se registraron los siguientes casos:  ms de 700 casos de meningitis,  unas 13000 infecciones de la [name],  cerca de 5 millones de infecciones del odo, y  alrededor de 200 muertes por la enfermedad neumoccica en nios menores de 5 aos. A partir de que la vacuna estuvo disponible, la enfermedad neumoccica grave en los nios ha disminuido un 88%. Cada ao, mueren alrededor de [name] a causa de la enfermedad [name] [place]. El tratamiento de las infecciones neumoccicas con penicilina y otros frmacos no es tan eficaz como sola serlo, dado que algunas cepas de la bacteria que causa la enfermedad se han vuelto resistentes a estos frmacos. Esto hace que la prevencin de la enfermedad mediante la vacunacin sea an ms importante. 2. [name] PCV13 La vacuna antineumoccica conjugada (llamada PCV13) protege contra 13tipos de bacterias  neumoccicas. La PCV13 se administra rutinariamente a los nios a los 2, 4, 6 y de 12 a 15 meses. Tambin se recomienda para los nios y [name] de 2 a 52aos con ciertas enfermedades y para todos los adultos de 65 aos o ms. Su mdico le puede dar ms detalles. 3. Algunas personas no deben recibir la vacuna Cualquiera que haya tenido alguna vez una reaccin alrgica potencialmente mortal a una dosis de esta vacuna, o a una vacuna anterior neumoccica llamada PCV7, o a cualquier vacuna que contiene toxoide de difteria (por ejemplo, DTaP), no debe recibir la PCV13. Las personas que tengan Set designer graves a algn componente de la vacuna PCV13 no deben recibir esa vacuna. Informe al mdico si la persona que se vacunar sufre algn tipo de alergia grave. Si la persona prevista para la vacunacin est enferma, el mdico podra decidir reprogramar la vacuna para [name], [name]. 4. Riesgos de [name] reaccin a la vacuna Con cualquier medicamento, incluidas las vacunas, existe la posibilidad de que [name]. Estas suelen ser leves y desaparecer por s solas, pero tambin es posible que se presenten reacciones graves. Los problemas informados despus de [name] la PCV13 variaban con la edad y la dosis de la serie. Los problemas ms frecuentes informados en los nios fueron los siguientes:  Alrededor de la mitad se puso somnoliento despus de la inyeccin, tuvo una prdida transitoria del apetito, enrojecimiento o sinti dolor con la palpacin en donde se le aplic la inyeccin.  Aproximadamente 1 de cada 3 tuvo hinchazn en donde se le aplic la inyeccin.  Aproximadamente 1 de cada 3 tuvo fiebre baja y alrededor de 1de cada 20tuvo fiebre de ms  de 102,34F (39C).  Hasta 8 de cada 10 nios se ponen fastidiosos o irritables. Los adultos han [name], enrojecimiento Daniel hinchazn donde se les aplic la vacuna; tambin fiebre baja, fatiga, dolor de cabeza, escalofros o dolor muscular. Los nios  pequeos, que reciben la vacuna PCV13 y la vacuna antigripal inactivada al mismo tiempo, pueden tener un mayor riesgo de sufrir convulsiones causadas por fiebre. Consulte al mdico para obtener ms informacin. Problemas que podran ocurrir despus de cualquier vacuna:  Las personas a veces se desmayan despus de un procedimiento mdico, incluida la vacunacin. Permanecer sentado o recostado durante 58mnutos puede ayudar a eMerrill Cervantes las lesiones causadas por las cadas. Informe al mdico si se siente mareado, tiene cambios en la visin o zumbidos en los odos.  Algunos nios mayores y adultos sienten un dolor intenso en el hombro y tienen dificultad para mover el brazo donde se coloc la vacuna. Esto sucede con muy poca frecuencia.  Cualquier medicamento puede causar una reaccin alrgica grave. Dichas reacciones son [name] frecuentes con una vacuna (se calcula que menos de 1en un milln de dosis) y se producen unos minutos a unas horas despus de la vacunacin. Al igual que con cualquier mHalliburton Cervantes existe una probabilidad muy pequea de que una vacuna cause una lesin grave o la muerte. Se controla permanentemente la seguridad de las vacunas. Para obtener ms informacin, visite: whttp://www.aguilar.org/ 5. Qu pasa si hay una reaccin grave? A qu signos debo estar atento?  Observe todo lo que le preocupe, como signos de una reaccin alrgica grave, fiebre muy alta o comportamiento fuera de lo normal. Los signos de una reaccin alrgica grave pueden incluir ronchas, hinchazn de la cara y la garganta, dificultad para respirar, latidos cardacos acelerados, mareos y debilidad, generalmente en el lapso de unos minutos a unas horas despus de la vacunacin. Qu debo hacer?  Si usted piensa que se trata de una reaccin alrgica grave o de otra emergencia que no puede esperar, llame al 911 o lleve a la persona al hospital ms cercano. De lo contrario, llame al [name] Las  reacciones deben informarse al Sistema de Informacin sobre Efectos Adversos de las [name], [name]). El mdico debe presentar este informe, o bien puede hacerlo usted mismo a travs del sitio web de [name], en [name] o llamando al [phone] [name] no brinda recomendaciones mdicas. 6. PLake ComoCompensacin de Daos por Daniel Compensacin de Daos por [name](National Vaccine Injury Compensation Program, VICP) es un programa federal que fue creado para cPatent examinera las personas que puedan haber sufrido daos al recibir ciertas vacunas. Aquellas personas que consideren que han sufrido un dao como consecuencia de una vacuna y qLao People's Democratic Republicsaber ms acerca del programa y de cmo presentar un reclamo, pueden llamar al [phone] o visitar el sitio web del VICP en wGoldCloset.com.ee Hay un lmite de tiempo para presentar un reclamo de compensacin. 7. Cmo puedo obtener ms informacin?  Pregntele al mdico. Este puede darle el prospecto de la vacuna o recomendarle otras fuentes de informacin.  Comunquese con el servicio de salud de su localidad o su estado.  Comunquese con los Centros para eBuilding surveyory la Prevencin de EProbation officerfor Disease Control and Prevention, CDC):  Llame al 1(757)753-3000(1-800-CDC-INFO) o  visite el sitio web dKimberly-Clarken whttp://hunter.com/ Declaracin de informacin de la vacuna Vacuna PCV13 (11/27/2013) Esta informacin no tiene cMarine scientistel consejo del mdico. AChief Strategy Officer  de hacerle al mdico cualquier pregunta que tenga. Document Released: 06/28/2007 Document Revised: 01/30/2014 Document Reviewed: 12/04/2013 Elsevier Interactive Patient Education  2017 [employer].

## 2016-06-20 NOTE — Progress Notes (Signed)
Patient ID: [name], male    DOB: [birth year]  MRN: [id number]  CC: Hospitalization Follow-up   Subjective: [name] is a 71 y.o. male who presents for hosp f/u.  Daughter, Lenell Antu, is with him.  Patient with recently diagnosed interstitial lung disease likely pulmonary fibrosis and is being followed by [facility]. His concerns today include:  Hosp x 2 in past mths.  Recent Hosp 5/16-18/2018 with acute resp distress with hypoxia.  Hosp course from dischg summary below:   Hospital Course:  1-acute respiratory distress: Most likely associated with his interstitial lung disease bronchospasm and pulmonary fibrosis. This was transient and mainly appreciated on admission with exertion.  -No sepsis. No PNA. -Mild elevation of his WBCs secondary to use of steroids -No further antibiotics were provided. Patient with low procalcitonin level, no abnormalities on CXR, afebrile, no productive cough and with his resp distress triggered by underlying lungs condition and bronchospasm most likely.  -Continue steroids, transitioned back to oral prednisone. Patient will follow tapering instructions as indicated by pulmonary service. Has upcoming appointment follow-up with Dr. Chase Caller. -Oxygen needs assessed, especially with activity; patient demonstrated no requiring oxygen supplementation prior to discharge. -Appreciate pulmonary service consultation and inputs  2-atypical chest discomfort -Most likely secondary to his ILD/IPF VS due to GERD -Troponin negative 3 -No acute ischemic changes on EKG or telemetry -Will discharge on daily use of PPI; as per his discomfort report, active use of the steroids and prior history, could also be secondary to reflux.  3-hyperglycemia -Triggered by steroids -Recent A1c 5.7 -Patient will continue tapering as indicated by pulmonary service and has been instructed to watch amount of carbohydrates in his diet.  4-Severe protein  calorie malnutrition -Patient has been discharged on ensure twice a day  5-hypertension -Will continue Cozaar. -Heart healthy diet encouraged  Today:  1.reports breathing status sometimes ok and other times "I struggle to breath." -he implemented walk/rest cycles which helps.  No fever.Some cough productive of yellowish phlegm x 3 days.  No increase SOB with this cough -still on Prednisone with weekly taper with goal of 20 mg daily until he sees his pulmonologist on the fifth of next month.  Requests RF  Omeprazole and Losartan.    2.  Protein/calorie malnutrition: had loss wgh almost 45 lbs down to 108 lbs at the start of breathing problem.  He has been gaining wgh since being on Prednisone.  Reports improved appetite. -PreDM with A1C 5.7 on recent hospitalization  Patient Active Problem List   Diagnosis Date Noted  . 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 Acute respiratory distress 06/07/2016  . HCAP (healthcare-associated pneumonia) 06/07/2016  . Sepsis ([place]) 06/07/2016  . HTN (hypertension) 06/07/2016  . Anemia   . Hyperglycemia   . Chest pain on breathing   . Pneumomediastinum ([place])   . Protein-calorie malnutrition, severe 05/15/2016  . Pneumonitis   . Interstitial lung disease ([place]) 05/13/2016  . ILD (interstitial lung disease) ([place]) 05/12/2016  . Weight loss, abnormal 05/12/2016  . Chest pain 05/12/2016  . Tachycardia 05/12/2016  . SOB (shortness of breath) 05/12/2016     Current Outpatient Prescriptions on File Prior to Visit  Medication Sig Dispense Refill  . Glycopyrrolate-Formoterol (BEVESPI AEROSPHERE) 9-4.8 MCG/ACT AERO Inhale 2 puffs into the lungs 2 (two) times daily.     . predniSONE (DELTASONE) 10 MG tablet Take 20-40 mg by mouth See admin instructions. 40 mg once a day for 7 days then 30 mg once a day  for 7 days then hold at 20 mg once a day     No current facility-administered medications on file prior to visit.     Allergies  Allergen Reactions  .  Cyclobenzaprine Swelling    Tongue and lips swell    Social History   Social History  . Marital status: Married    Spouse name: N/A  . Number of children: N/A  . Years of education: N/A   Occupational History  . Not on file.   Social History Main Topics  . Smoking status: Former Smoker    Packs/day: 2.00    Years: 40.00    Quit date: 05/03/2001  . Smokeless tobacco: Never Used  . Alcohol use No  . Drug use: No  . Sexual activity: Not on file   Other Topics Concern  . Not on file   Social History Narrative   Lives with others.   Unemployed       No family history on file.  Past Surgical History:  Procedure Laterality Date  . EYE SURGERY    . HERNIA REPAIR      ROS: Review of Systems -as stated above  PHYSICAL EXAM: BP 137/85   Pulse (!) 113   Temp 97.6 F (36.4 C) (Oral)   Resp 16   Wt 118 lb (53.5 kg)   SpO2 93%   BMI 20.25 kg/m   Pox on ambulation past nursing station for about 1 min. Pox drops to 88, HR in 120s. Pox Increases back to 92-93 after resting for few minutes Wt Readings from Last 3 Encounters:  06/20/16 118 lb (53.5 kg)  06/07/16 116 lb 6.4 oz (52.8 kg)  05/30/16 113 lb 6.4 oz (51.4 kg)   Physical Exam General appearance - alert, elderly hispanic male, and in no distress Mental status - normal mood, behavior, speech, dress, motor activity, and thought processes Mouth - mucous membranes moist, pharynx normal without lesions. Dentures above Neck - supple, no significant adenopathy Chest - Velcro crackles throughout the lungs Heart - tachy, regular Extremities - no lower extremity edema. +clubbing   ASSESSMENT AND PLAN: 1. ILD (interstitial lung disease) ([place]) -with pulmonary fibrosis.  Patient qualifies for oxygen with ambulation. However patient without insurance at this time. He has applied for Medicaid. [facility] can provide portable oxygen tanks at the cost of $250-$300 per month which patient cannot afford at this  time. Caseworker will look into whether [facility] charity can help with this.  In the meantime patient to be seen in the emergency room if he has increased shortness of breath -I have given him enough prednisone to last until he sees his pulmonologist . -Calcium and vitamin D supplement added - omeprazole (PRILOSEC) 40 MG capsule; Take 1 capsule (40 mg total) by mouth daily.  Dispense: 30 capsule; Refill: 6 - predniSONE (DELTASONE) 10 MG tablet; Take 2 tabs daily  Dispense: 14 tablet; Refill: 0 - calcium-vitamin D (OSCAL WITH D) 500-200 MG-UNIT tablet; Take 1 tablet by mouth 2 (two) times daily.  Dispense: 60 tablet; Refill: 3 - Pneumococcal conjugate vaccine 13-valent IM  2. Tachycardia - EKGs with ST rather than MAT seen in some pts with COPD.  -check TSH. Looks like Pulmonary also ordered echo on previous visit  3. Hypertension, unspecified type - losartan (COZAAR) 25 MG tablet; Take 1 tablet (25 mg total) by mouth daily.  Dispense: 30 tablet; Refill: 6  4. Prediabetes -Due to ongoing steroid use. We will need to keep an eye  on his A1c  5. Need for Tdap vaccination - Tdap vaccine greater than or equal to 7yo IM  6. Need for hepatitis C screening test - Hepatitis C Antibody  Patient was given the opportunity to ask questions.  Patient verbalized understanding of the plan and was able to repeat key elements of the plan.   Orders Placed This Encounter  Procedures  . Pneumococcal conjugate vaccine 13-valent IM  . Tdap vaccine greater than or equal to 7yo IM  . TSH  . Hepatitis C Antibody     Requested Prescriptions   Signed Prescriptions Disp Refills  . losartan (COZAAR) 25 MG tablet 30 tablet 6    Sig: Take 1 tablet (25 mg total) by mouth daily.  Marland Kitchen omeprazole (PRILOSEC) 40 MG capsule 30 capsule 6    Sig: Take 1 capsule (40 mg total) by mouth daily.  . predniSONE (DELTASONE) 10 MG tablet 14 tablet 0    Sig: Take 2 tabs daily  . calcium-vitamin D (OSCAL WITH D) 500-200 MG-UNIT  tablet 60 tablet 3    Sig: Take 1 tablet by mouth 2 (two) times daily.    Return in about 3 months (around 09/20/2016).  Karle Plumber, MD, FACP

## 2016-06-21 ENCOUNTER — Telehealth: Payer: Self-pay | Admitting: Internal Medicine

## 2016-06-21 DIAGNOSIS — J849 Interstitial pulmonary disease, unspecified: Secondary | ICD-10-CM

## 2016-06-21 LAB — HEPATITIS C ANTIBODY: Hep C Virus Ab: <0.1 s/co ratio (ref 0.0–0.9)

## 2016-06-21 LAB — TSH: TSH: 2.31 u[IU]/mL (ref 0.450–4.500)

## 2016-06-21 NOTE — Telephone Encounter (Signed)
Pt called to request a script for O2 be sent to the [facility] stating that he will just have to pay for it bc he is feeling really ill and cannot continue to wait. Requests a call to advise when script has been sent. Thank you.

## 2016-06-21 NOTE — Telephone Encounter (Signed)
I cannot assist with this, will defer to Dr. Wynetta Emery

## 2016-06-22 NOTE — Telephone Encounter (Signed)
[facility] does not sell O2.  I spoke with Opal Sidles this a.m.  She is will find out if [facility] will cover.  If not he can pick up rxn and take to any medical supply store.  If unable to afford and breathing gets bad, be seen in ER.

## 2016-06-22 NOTE — Telephone Encounter (Addendum)
Call placed to [facility] # [phone] to inquire if they would provide charity care for a patient who needs O2. Spoke to [place] who stated that they do not provide charity O2. The cost for a concentrator is $115/month.  The E- tanks are $22 each time they are re-filled. The cart for the tanks and regulator is $12.50 - one time cost. There is no charge for the nasal cannula and tubing. She stated that they require a credit/debit card or checking account on record in order to provide the equipment to cover recurring costs.  The prescription can be faxed to # [phone].   Call placed to [facility], spoke to [place] regarding the need for charity O2. She stated that she was not aware of any resources.   Call placed to [facility] # [phone] regarding resources for charity O2. Spoke to [place] who said that they use [facility] or [facility] but the resources available for charity care are very limited. Informed her that this CM has already contacted [facility] and [facility]. Chrissie Noa also provided the contact that she works with at [facility]  # [phone].  Call placed to Ms Frederico Hamman at the above #. Message left requesting a call back to # [phone].  This CM spoke to Johnnette Barrios, [employer] Financial Counselor regarding the need for charity O2. She stated that she was not aware of any resources, including the [facility] that would provided charity O2.  Attempted to contact the patient to explain that this CM is working on identifying options for home O2 for him and to notify him that [facility] would need  credit card/checking account information in order to provide the home O2.  Call placed to # [phone] and a voicemail message was left requesting a call back to # [phone]. Call also placed to # [phone] and the voice  Mailbox was not set up yet. The calls were placed with the assistance of Spanish interpreter # [phone] with  [employer].   Call placed to Bishop [place], RN CM to inquire about options for charity O2. She suggested contacting Edwinna Areola, [facility] # [phone].  Call placed to Ms Hyman Hopes to inquire about option for charity care and she stated that she would look into it.  Call received from [facility] with [facility].  She stated that they no longer provide charity care from the community, the patient would need to be coming out of the hospital. She also noted that even if he was hospitalized, he would need to complete an application and qualify after a credit check is completed. She noted that the could set up a payment plan for him.

## 2016-06-22 NOTE — Telephone Encounter (Signed)
Call received from [facility] with [facility]. She stated that she spoke with Edwinna Areola, [facility] liaison at [place] stated that the patient may be medicaid pending and if so, they would be able to provide the O2 for him at no cost.  She noted that she will fax this CM a  request for information /documentation that is needed to determine eligibility.  She requested that when all documentation has been  submitted , a staff message be sent to her informing her of such.

## 2016-06-22 NOTE — Telephone Encounter (Signed)
Fax received from [facility] with [facility] noting the documentation needed for O2 order. This information was shared with Dr Wynetta Emery.

## 2016-06-23 ENCOUNTER — Other Ambulatory Visit: Payer: Self-pay | Admitting: Internal Medicine

## 2016-06-23 ENCOUNTER — Telehealth: Payer: Self-pay | Admitting: Internal Medicine

## 2016-06-23 DIAGNOSIS — J849 Interstitial pulmonary disease, unspecified: Secondary | ICD-10-CM

## 2016-06-23 NOTE — Telephone Encounter (Signed)
[name] daughter came to the office  To ask about his father Oxygen and if you can call her to follow up . Thank you .

## 2016-06-23 NOTE — Telephone Encounter (Signed)
This call was returned and this CM spoke to the patient. Please refer to notes from other calls today.

## 2016-06-23 NOTE — Progress Notes (Signed)
SATURATION QUALIFICATIONS: (This note is used to comply with regulatory documentation for home oxygen)  Patient Saturations on Room Air at Rest = 93%  Patient Saturations on Room Air while Ambulating = 88%  Patient Saturations on  Liters of oxygen while Ambulating = not done on visit 06/20/2016.  Patient desat to 88% on RA after ambulating about 1 minute.   Please briefly explain why patient needs home oxygen: patient with newly diagnosed interstitial lung disease.  Very dyspneic on ambulation.  Oxygen levels drop to 88% on room air with ambulation.

## 2016-06-23 NOTE — Telephone Encounter (Signed)
Patient's daughter called states she received a call and is returning call, please f/up

## 2016-06-23 NOTE — Telephone Encounter (Signed)
Staff message sent to [name], [facility] this morning informing her that Dr Wynetta Emery has completed the O2 order and O2 saturation documentation.    Call returned to the patient's daughter, [name]  [phone] with the assistance of Spanish interpreter # [phone] with [facility] [employer]. Voice mail message left requesting a call back to # [phone].  Message received from [facility] noting that she is not able to access the information about the O2 sats.    Call placed to [name], [facility] with Dr Wynetta Emery present. [name] was still not able to access the O2 sats information in EPIC after Dr Wynetta Emery explained how the documentation was completed.  It was also noted that the O2 sat with the patient on O2 while ambulating was not obtained. [name] stated that she must have this information in order to check medicaid status for coverage.  [name] said  that the patient has an appointment with pulmonary on 06/27/16 @ 1115 and she suggested that the patient follow up with [name] about the O2 at that time. Dr Wynetta Emery was in agreement.   Message received again from the patient's daughter requesting a call back.  Call returned to the daughter at # [phone]  with the assistance of Spanish interpreter # [phone] with [facility] [employer]. The patient answered and noted that his daughter was not with him.  Informed the patient that we are trying to get his oxygen covered by medicaid as the medicaid is still pending. This would not cost him anything if it is approved. Otherwise, he would need to pay for the O2. Also explained that Dr Wynetta Emery has been working on this and it is noted that he has an appointment with pulmonary on Tuesday, 06/27/16  1115.  At this time, the need for O2 will  be deferred to [name].  The patient stated that he understood and as aware of the appointment. He was also appreciative of the assistance. Instructed him to go to the ED if he develops any difficulty breathing  and he stated that he understood. He reported no questions/concerns at this time.  This CM also instructed him to inform his daughter of this conversation and again he said that he would.

## 2016-06-26 ENCOUNTER — Telehealth: Payer: Self-pay

## 2016-06-26 ENCOUNTER — Telehealth: Payer: Self-pay | Admitting: Internal Medicine

## 2016-06-26 NOTE — Telephone Encounter (Signed)
Call placed to [facility] to notify the provider that the patient qualified for O2 when he was seen by Dr Wynetta Emery in the office last week.  He has no insurance and [facility] has been working with this office to process the order and verify if the patient is medicaid potential so they can provide him with the O2.  [facility] was not able to access the required documentation from Dr Wynetta Emery and requested that the patient follow up with [name] about the O2 at his appointment tomorrow - 06/27/16. This CM spoke to [place] and left this message with call back #.

## 2016-06-26 NOTE — Telephone Encounter (Deleted)
[place]

## 2016-06-26 NOTE — Telephone Encounter (Deleted)
[name] called from [facility] - pt being seen tomorrow - pt needs O2 sats done at visit to qualify him for oxygen. PCP doesn't have all 3 needed . [name] can be reached at [phone] -pr

## 2016-06-27 ENCOUNTER — Ambulatory Visit (INDEPENDENT_AMBULATORY_CARE_PROVIDER_SITE_OTHER): Payer: Self-pay | Admitting: Internal Medicine

## 2016-06-27 ENCOUNTER — Encounter: Payer: Self-pay | Admitting: Internal Medicine

## 2016-06-27 DIAGNOSIS — R0600 Dyspnea, unspecified: Secondary | ICD-10-CM

## 2016-06-27 DIAGNOSIS — J670 Farmer's lung: Secondary | ICD-10-CM

## 2016-06-27 DIAGNOSIS — R0689 Other abnormalities of breathing: Secondary | ICD-10-CM

## 2016-06-27 DIAGNOSIS — J84112 Idiopathic pulmonary fibrosis: Secondary | ICD-10-CM

## 2016-06-27 DIAGNOSIS — J849 Interstitial pulmonary disease, unspecified: Secondary | ICD-10-CM

## 2016-06-27 HISTORY — DX: Idiopathic pulmonary fibrosis: J84.112

## 2016-06-27 MED ORDER — PREDNISONE 10 MG PO TABS: ORAL_TABLET | ORAL | 1 refills | Status: DC

## 2016-06-27 MED FILL — ?PREDNISONE 10 MG TABLET: 10 | 30 days supply | Qty: 60 | Fill #0

## 2016-06-27 NOTE — Patient Instructions (Signed)
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 [place] HIGH PROB - Course   - progressive disease in almost all patients (> 90%); typically few to several year progression   - super unlukcy 10% - progress to death in a year   - super lucky < 10% - stability > 5 years or even rarely 10 years   -unpredictable in each individual - Rx:  anti-fibrotics + since 2014 but they can only slow disease down; preventative. Not Rx symptoms  - they have side effects including intolerance is < 1/3rd patients  - most 55-65% patients tolerate them well  - further details below - Other pillars of management  - Symptoms - cough and dyspnea -  - O2 - definitely helps symptoms   - test you for walk test and ONO 06/27/2016  - Rehab - definitely helps symptoms and conditioning   - refer rehab 06/27/2016  - Transplant - age < 9, good social support, BMI < 26 and $15,000 in cash   - will discuss this later  - Pulmonary Trials: highly encourage participation through [facility]   - will discuss this later  - Patient Support Group    - [email or web address] \   - - will discuss this later [email or web address]   Anti-fibrotic Drugs Both drugs OFEV and [name] only slow down progression, 1 out of 6 patients  - this means extension in quality of life but no difference in symptoms  - no study directly compares the 2 drugs but efficacy roughly equal at 1 year time point  - Recommed [name] for you -  - start at 1 pill thrree times daily with food  - one week later go to 2 pills three times daily - this is your MAX Dose   - do not go to 3 pills three times daily   Followup  - 6 weeks with APP Tammy  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

## 2016-06-27 NOTE — Assessment & Plan Note (Addendum)
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 CT scan can overlap IPF and chronic hypersensitivity pneumonitis. Definitive diagnosis will require biopsy for which there is some risk. 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 We'll just treat him with prednisone   Plan Continue prednisone 57m per day

## 2016-06-27 NOTE — Progress Notes (Signed)
Subjective:     Patient ID: [name], male   DOB: 1945-02-12, 71 y.o.   MRN: [id number]  HPI    PCP No PCP Per Patient  HPI    IOV 05/12/2016  Chief Complaint  Patient presents with  . Pulmonary Consult    SOB more lately, doctor in [facility] gave pt [place] and he does take it everyday but no reflief    71 year old [place]. He has been living and [place] with his daughters Lenell [place] and [place] since 2004. 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 He is been referred for interstitial lung disease.  History according to the [employer] of chest physicians interstitial lung disease questionnaire - filled in by daughters . PAtint speaks no english. Daughters limited english  He reports a two-month history of cough that is that on most days. It is severe and it interferes with his activity. It is a dry cough in quality. There is associated shortness of breath that is at least level II which is a gets short of breath when hurrying on level ground or walking up a slight hill. Symptoms are associated with 30 pound weight loss over the last 2-4 months. Also with dysphagia and heartburn and dry mouth. But he denies any rash or edema or sensitivity to light and wheezing or oral ulcers or chest pain [other than some left infra-axillary pain]   In terms of personal exposure history: He denies any street drug use. He used to smoke cigarettes started smoking at age 72 smoked until age56;  40 cigarettes per day.   Family history: He denies any emphysema, COPD asthma, sarcoidosis, cystic fibrosis, pulmonary fibrosis, hypersensitivity pneumonitis. He does not live in a old house. 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 They deny any work in a  minor quarry are all [employer] or a foundry   Pulmonary toxicity history: He denies. No radiation exposure.  He presented to urgent care and at [facility]/2016:I do not have the images with me. But I reviewed the chart. His creatinine was 1.0 mg percent. His CT is reported as severe interstitial fibrosis with peripheral honeycombing. No pleural or pericardial effusion. This is the wall distal esophagus and esophagitis. Negative upper abdomen. He did get IV contrast. No mass noted  Walking desaturation test 185 feet 3 laps on room air:   He walked only 1 lap and stopped due to shortness of breath. Did not desaturate. HR 131/min. Denied chest pain   05/30/2016 Follow up : Post hospital follow up -patient is accompanied by a a family friend that is interpreting . Patient presents for a two-week follow-up. Patient was seen for a pulmonary consult 05/12/2016 for pulmonary fibrosis workup. She was admitted 05/13/2016 for progressive shortness of breath. Patient underwent a CT chest that was negative for PE but showed a small pneumomediastinum potentially due to rupture of a peripheral bleb. 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 ILD workup showed a negative ANA, anti-DNA antibody, rheumatoid factor, CCP, Sjogren's, scleroderma. C ANCA was mildly positive . 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 -c/w Farmers lung . ESR 95.  Patient is from [place] and worked in [place] most of his life. 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  Since discharge. Patient says he is feeling some better. Patient was started on prednisone 40 mg daily. Shortness of breath has decreased. Patient does get winded with walking.  Has some dry cough.   OV  06/27/2016  Chief Complaint  Patient presents with  . Follow-up    Pt states his SOB has worsened since last OV. Pt c/o cough with  little mucus production. Pt denies f/c/s and CP/tightness.    Follow-up interstitial lung disease - male gender, age greater than 75 presence of clubbing, UIP pattern on CT chest, perform work and exposure to ceramics  He presents with his daughter-in-law. His daughters are not here. We had to use [employer] interpreter. Patient tells me that with prednisone 20 mg per day he might be better. At first he said he is better. Then he changes to redo that he is not better. He just has to walk 50 feet and he gets very fatigued and short of breath and is unable to walk any further . He is currently on prednisone 20 mg per day. Walking desaturation test on 06/27/2016 185 feet x 3 laps on RA - did only 1 LAP and stopped due to [facility]:  did  NOT  desaturate. Rest pulse ox was 97%, final pulse ox was 93. HR response 125/min at rest to 139/min at peak exertion. He did not have chest pain when he walked. 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    has a past medical history of GERD (gastroesophageal reflux disease); Hyperglycemia; Hypertension; and Pulmonary fibrosis ([place]).   reports that he quit smoking about 15 years ago. He has a 80.00 pack-year smoking history. He has never used smokeless tobacco.  Past Surgical History:  Procedure Laterality Date  . EYE SURGERY    . HERNIA REPAIR      Allergies  Allergen Reactions  . Cyclobenzaprine Swelling    Tongue and lips swell    Immunization History  Administered Date(s) Administered  . Influenza Split 01/24/2016  . Pneumococcal Conjugate-13 06/20/2016  . Pneumococcal Polysaccharide-23 11/05/2012    No family history on file.   Current Outpatient Prescriptions:  .  calcium-vitamin D (OSCAL WITH D) 500-200 MG-UNIT tablet, Take 1 tablet by mouth 2 (two) times daily., Disp: 60 tablet, Rfl: 3 .   Glycopyrrolate-Formoterol (BEVESPI AEROSPHERE) 9-4.8 MCG/ACT AERO, Inhale 2 puffs into the lungs 2 (two) times daily. , Disp: , Rfl:  .  losartan (COZAAR) 25 MG tablet, Take 1 tablet (25 mg total) by mouth daily., Disp: 30 tablet, Rfl: 6 .  omeprazole (PRILOSEC) 40 MG capsule, Take 1 capsule (40 mg total) by mouth daily., Disp: 30 capsule, Rfl: 6 .  predniSONE (DELTASONE) 10 MG tablet, Take 2 tabs daily, Disp: 14 tablet, Rfl: 0    Review of Systems     Objective:   Physical Exam  Constitutional: He is oriented to person, place, and time. He appears well-developed and well-nourished. No distress.  HENT:  Head: Normocephalic and atraumatic.  Right Ear: External ear normal.  Left Ear: External ear normal.  Mouth/Throat: Oropharynx is clear and moist. No oropharyngeal exudate.  Eyes: Conjunctivae and EOM are normal. Pupils are equal, round, and reactive to  light. Right eye exhibits no discharge. Left eye exhibits no discharge. No scleral icterus.  Neck: Normal range of motion. Neck supple. No JVD present. No tracheal deviation present. No thyromegaly present.  Cardiovascular: Normal rate, regular rhythm and intact distal pulses.  Exam reveals no gallop and no friction rub.   No murmur heard. Pulmonary/Chest: Effort normal. No respiratory distress. He has no wheezes. He has rales. He exhibits no tenderness.  Abdominal: Soft. Bowel sounds are normal. He exhibits no distension and no mass. There is no tenderness. There is no rebound and no guarding.  Musculoskeletal: Normal range of motion. He exhibits no edema or tenderness.  Clubbing present  Lymphadenopathy:    He has no cervical adenopathy.  Neurological: He is alert and oriented to person, place, and time. He has normal reflexes. No cranial nerve deficit. Coordination normal.  Skin: Skin is warm and dry. No rash noted. He is not diaphoretic. No erythema. No pallor.  Psychiatric: He has a normal mood and affect. His behavior is normal.  Judgment and thought content normal.  Nursing note and vitals reviewed.  Vitals:   06/27/16 1121  BP: (!) 122/58  Pulse: (!) 118  SpO2: 93%  Weight: 117 lb 6.4 oz (53.3 kg)  Height: 5' 4" (1.626 m)    Estimated body mass index is 20.15 kg/m as calculated from the following:   Height as of this encounter: 5' 4" (1.626 m).   Weight as of this encounter: 117 lb 6.4 oz (53.3 kg).     Assessment:       ICD-9-CM ICD-10-CM   1. IPF (idiopathic pulmonary fibrosis) (HCC) 516.31 J84.112   2. 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 Dyspnea and respiratory abnormalities 786.09 R06.00     R06.89        Plan:     IPF (idiopathic pulmonary fibrosis) ([place]) I think you have IPF. 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 Not Rx symptoms  - they have side effects including intolerance is < 1/3rd patients  - most 55-65% patients tolerate them well  - further details below - Other pillars of management  - Symptoms - cough and dyspnea -  - O2 - definitely helps symptoms   - test you for walk test and ONO 06/27/2016  - Rehab - definitely helps symptoms and conditioning   - refer rehab 06/27/2016  - Transplant - age < 91, good social support, BMI < 26 and $15,000 in cash   - will discuss this later  - Pulmonary Trials: highly encourage participation through [facility]   - will discuss this later  - Patient Support Group    - [email or web address].com/ [email or web address]   - - will discuss this later  [email or web address]-davis.net/   Anti-fibrotic Drugs Both drugs OFEV and [name] only slow down progression, 1 out of 6 patients  - this means extension in quality of life but no difference in symptoms  - no study directly compares the 2 drugs but efficacy roughly equal at 1 year time point  -  Recommed [name] for you -  - start at 1 pill thrree times daily with food  - one week later go to 2 pills three times daily - this is your MAX Dose   - do not go to 3 pills three times daily   Followup  - 6 weeks with APP Tammy  Farmer's lung (HCC) Possible farmers lung based on positive hypersensitivity pneumonitis profile and farm work. CT scan can overlap IPF and chronic hypersensitivity pneumonitis. Definitive diagnosis will require biopsy for which there is some risk. 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 We'll just treat him with prednisone   Plan Continue prednisone 20mg per day  Dyspnea and respiratory abnormalities Refer to cardiology to rule out angina equivalent  . I'm concerned of the level of exertional dyspnea that he has associated with tachycardia. While this can be clearly seen with interstitial lung disease is not desaturating at that time he stops because of dyspnea. Insidious very tachycardic even at rest and with exertion. Therefore I wonder if there is associated cardiac abnormalities with further coronary artery disease or arrhythmia   > 50% of this > 40 min visit spent in face to face counseling or/and coordination of care    Dr. Murali Ramaswamy, M.D., F.C.C.P Pulmonary and Critical Care Medicine Staff Physician [facility] Pager: [phone], If no answer or between  15:00h - 7:00h: call [phone]  06/27/2016 12:12 PM         

## 2016-06-27 NOTE — Telephone Encounter (Signed)
ATC Jane at [facility] - no answer, no machine.  Will call back.

## 2016-06-27 NOTE — Assessment & Plan Note (Addendum)
I think you have IPF. 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 Not Rx symptoms  - they have side effects including intolerance is < 1/3rd patients  - most 55-65% patients tolerate them well  - further details below - Other pillars of management  - Symptoms - cough and dyspnea -  - O2 - definitely helps symptoms   - test you for walk test and ONO 06/27/2016  - Rehab - definitely helps symptoms and conditioning   - refer rehab 06/27/2016  - Transplant - age < 16, good social support, BMI < 26 and $15,000 in cash   - will discuss this later  - Pulmonary Trials: highly encourage participation through [facility]   - will discuss this later  - Patient Support Group    - [email or web address]/ \   - - will discuss this later [email or web address]/   Anti-fibrotic Drugs Both drugs OFEV and [name] only slow down progression, 1 out of 6 patients  - this means extension in quality of life but no difference in symptoms  - no study directly compares the 2 drugs but efficacy roughly equal at 1 year time point  - Recommed [name] for you -  - start at 1 pill thrree times daily with food  - one week later go to 2 pills three times daily - this is your MAX Dose   - do not go to 3 pills three times daily   Followup  - 6 weeks with APP Tammy

## 2016-06-27 NOTE — Assessment & Plan Note (Addendum)
Refer to cardiology to rule out angina equivalent  . I'm concerned of the level of exertional dyspnea that he has associated with tachycardia. While this can be clearly seen with interstitial lung disease is not desaturating at that time he stops because of dyspnea. Insidious very tachycardic even at rest and with exertion. Therefore I wonder if there is associated cardiac abnormalities with further coronary artery disease or arrhythmia

## 2016-06-28 ENCOUNTER — Telehealth: Payer: Self-pay

## 2016-06-28 NOTE — Telephone Encounter (Signed)
Called and spoke with Opal Sidles and she stated that the pt was seen in their clinic and did qualify for the oxygen, but the information was not entered correctly and they could not transmit it.  He did not qualify yesterday at his OV---but was referred to cardiology.  MR please advise on the oxygen.  Do we need to do a six min walk to see if he qualifies?  Thanks

## 2016-06-28 NOTE — Telephone Encounter (Signed)
Left message for patient to call.

## 2016-06-28 NOTE — Telephone Encounter (Signed)
Call received from Dr Golden Pop office regarding the need for home O2. Marlowe Kays, CMA to discuss next steps with [name].

## 2016-06-28 NOTE — Telephone Encounter (Signed)
He did NOT qualitfy for o2 with exertion at 2 visits in our office.  So  A) he needs ono B) yes he needs 36md which is more sensitie for desaturation  Dr. MBrand Males M.D., FSheppard Pratt At Ellicott CityC.P Pulmonary and Critical Care Medicine Staff Physician [facility] and Critical Care Pager: [phone] If no answer or between  15:00h - 7:00h: call [phone]  06/28/2016 1:57 PM

## 2016-06-29 NOTE — Telephone Encounter (Signed)
lmtcb x2 for pt. 

## 2016-06-30 NOTE — Telephone Encounter (Signed)
Called and spoke with pts granddaughter and she stated that he is down the street and she will go to him and call back .

## 2016-06-30 NOTE — Telephone Encounter (Signed)
[name] granddaughter, [place] [phone]. She states to schedule the first thing available and call her back with dates and times.

## 2016-06-30 NOTE — Telephone Encounter (Signed)
Pt needs to be scheduled for 6 minute walk and ono.

## 2016-06-30 NOTE — Telephone Encounter (Signed)
Pt granddaughter returning call, made appoint  For 6/13 _0  for 57m.STheodoro Cervantes

## 2016-07-03 ENCOUNTER — Telehealth: Payer: Self-pay

## 2016-07-03 DIAGNOSIS — J849 Interstitial pulmonary disease, unspecified: Secondary | ICD-10-CM

## 2016-07-03 NOTE — Telephone Encounter (Signed)
[name] application faxed to [employer] on 06/29/2016.   Application in [facility] specialty meds folder. Routing to [place] for follow-up.

## 2016-07-04 NOTE — Telephone Encounter (Signed)
[employer] and spoke with rep  She is calling to verify general information on the pt  I gave her his insurance information b/c she thought he did not have any insurance, but he has a medicaid that was scanned in  Nothing further needed  Will forward back to EE

## 2016-07-04 NOTE — Telephone Encounter (Signed)
[name] called from [place] and needs additional information in order to proceed with enrollment for [place] - Patient ID# [id number], she can be reached at [phone] -pr

## 2016-07-05 ENCOUNTER — Ambulatory Visit (INDEPENDENT_AMBULATORY_CARE_PROVIDER_SITE_OTHER): Payer: Self-pay | Admitting: *Deleted

## 2016-07-05 DIAGNOSIS — R0602 Shortness of breath: Secondary | ICD-10-CM

## 2016-07-05 NOTE — Progress Notes (Signed)
Patient seen in office today for Six Minute Walk only and walk test was not completed due to patient experiencing tachycardia. Pt arrived back to 6MW hallway-with Interpreter- and as soon as he sat down in the chair he stated that he was not sure if he was going to be able to do the test because he was severely SOB and having chest tightness. Interpreter asked him if he was having any other symptoms and the patient denied chest pain but stated that he felt very "aggitated" and could feel his heart racing. Vitals were obtained and the patient remained seated resting for about 10 minutes and his heart rate was 138bpm at rest, 96% O2, BP 108/78. The patient reported per the BORG scale his dyspnea was ranging 7-8 (very severe) and his fatigue a 6 (severe).  Pt was visibly in some distress with his breathing. I discussed with Eric Form, NP the patient's current symptoms and requested recommendations, it was recommended that the patient needed a 12-Lead but that d/t his persistent tachycardia, SOB, chest tightness and agitation that he be evaluated at the ED to get maximal care. Patient has a pending appt with a Cardiologist on 07/13/16 within [facility]. This advice was relayed to the patient through the interpreter and the patient became upset stating that he should have just gone to the ED to begin with. I apologized for the inconvenience but explained the importance of having this checked on today given his elevated HR and symptoms. Patient refused to go to the ED and stated that he was going to just go home. Pt became very frustrated asking for [place] to just write a prescription for his oxygen, I apologized stating that he does not at this point qualify for the oxygen, per last OV and ambulatory oximetry and that is why the 6MW was ordered. Pt looked at me and stated (through the interpreter) "can I go home now?", Pt was then advsied again of our recommendations to be evaluated immediately at the ED. The patient's O2 at this  was 93% at rest and HR 127bmp.  Pt dismissed himself from the exam room and left. Per the Interpreter the patient was going home.   Virl Cagey, CMA 07/05/16  SIX MIN WALK 07/05/2016 06/27/2016 05/12/2016  Medications meds not reviewed d/t test not being completed.  - -  Supplimental Oxygen during Test? (L/min) - No No  Baseline BP (sitting) 108/78 - -  Baseline Heartrate 138 - -  Baseline Dyspnea (Borg Scale) 8 - -  Baseline Fatigue (Borg Scale) 6 - -  Baseline SPO2 96 - -  Tech Comments: patient having tachycardia - test not performed. Pt recommended to go to the ED. See notes in progress notes for more information and explaination. --amg - -

## 2016-07-05 NOTE — Telephone Encounter (Signed)
[name] [employer] sent a fax stating that pt does not have rx coverage through Medicaid.  Pt has been referred to [employer] to see if pt will qualify for free medication.  Fax has been attached to application held in [employer] specialty rx folder.  Will route back to [place] for continued follow-up.

## 2016-07-07 ENCOUNTER — Telehealth: Payer: Self-pay | Admitting: Internal Medicine

## 2016-07-07 NOTE — Telephone Encounter (Signed)
To speak to Terisa Belardo only when returning call.  [name] (granddaughter) is not on DPR, not able to speak to her regarding the patient.

## 2016-07-07 NOTE — Telephone Encounter (Signed)
[name] returned call - advised that d/t her not being on the patient's DPR that I am unable to disclose any clinical information but can give limited information for her questions.  [name] asking about his most recent visit - states the patient was very confused and upset stating that he came for a test and was told to the go to the ER and was unsure why and states that it was never explained 100% to him why we were sending him to the ER which is why he went home. I advised (limited) [name] that through an interpreter the patient was explained in a lengthy conversation (about 30 mins +) the reason why we were sending him to the ER and that it was reiterated several times to ensure that he understood. I explained that the patient refused to go to the ER and that at that point I had to let him leave because we cannot force a patient to go, all we can do if offer our recommendations and urge the patient to seek help. The patient's granddaughter feels that maybe the interpreter appointed to him at his 07/05/16 visit did not interpret things 100% which caused the patient to be confused. I apologized for her feeling that way and told her the unfortunately there is no way for [place] to know what is being interpreted. She expressed understanding.  [name] asked about upcoming appts - verifying dates and what they are for.  [name] also asked about questions regarding the transplant information on the patient's AVS that she was reading. I advised that when the patient comes for his appt with TP on 08/14/16 all this can be discussed at that time.  I also advised that she needed to have the patient add her to the [facility] on his file so that in the future we are able to discuss things with her in more detail. She stated that I was very helpful and that all of her questions were answered well.   I spoke with Parke Poisson, CMA and advised of all this and talked to her about possible getting a different interpreter at his next  visit if the same interpreter from 07/05/16 shows up.   Nothing further needed.

## 2016-07-10 ENCOUNTER — Encounter: Payer: Self-pay | Admitting: Internal Medicine

## 2016-07-11 ENCOUNTER — Telehealth: Payer: Self-pay | Admitting: Internal Medicine

## 2016-07-11 DIAGNOSIS — J9611 Chronic respiratory failure with hypoxia: Secondary | ICD-10-CM

## 2016-07-11 NOTE — Telephone Encounter (Signed)
I have spoken with pt though interpreter, who states he did have ONO last night and return equipment this morning. Lm for [name].

## 2016-07-11 NOTE — Telephone Encounter (Signed)
ATC-unable to leave vm due to mailbox not being set up. Wcb.  Will route to MR to make aware.

## 2016-07-11 NOTE — Telephone Encounter (Signed)
Pt grandchild returning call and can be reached @ [phone].[name]

## 2016-07-12 ENCOUNTER — Telehealth: Payer: Self-pay | Admitting: Internal Medicine

## 2016-07-12 NOTE — Telephone Encounter (Signed)
PT daughter came to the office to request a paper scrip for Oxygen.  PT is un-insured is aware of the out pocket cost, please call daughter when prescription is ready

## 2016-07-12 NOTE — Telephone Encounter (Signed)
[name], granddaughter, is calling to check on [name]. States she was told Thedora Hinders told her they are waiting on information from our office.  [name], [place] is [phone].

## 2016-07-12 NOTE — Telephone Encounter (Signed)
Granddaughter, [name] is calling and wants to know when should the patient expect to receive his O2 since ONO has been returned. [name] [phone].

## 2016-07-12 NOTE — Telephone Encounter (Signed)
Gerald Stabs with [facility], [place], is calling about [name], initial titration RX, there was a problem with the RX and is faxing over another form to be completed.  She is faxing to triage fax.

## 2016-07-12 NOTE — Telephone Encounter (Signed)
Will forward to case worker to f/u on this.  Pt was seen in pulmonary office 07/05/2016 and had a 6 min walk test.  Did not qualify for O2 based on that test.

## 2016-07-13 ENCOUNTER — Ambulatory Visit: Payer: Self-pay | Admitting: Internal Medicine

## 2016-07-13 ENCOUNTER — Ambulatory Visit (INDEPENDENT_AMBULATORY_CARE_PROVIDER_SITE_OTHER): Payer: Self-pay | Admitting: Cardiology

## 2016-07-13 ENCOUNTER — Encounter: Payer: Self-pay | Admitting: Cardiology

## 2016-07-13 ENCOUNTER — Telehealth: Payer: Self-pay | Admitting: Internal Medicine

## 2016-07-13 VITALS — BP 110/77 | HR 127 | Ht 64.0 in | Wt 117.0 lb

## 2016-07-13 DIAGNOSIS — J849 Interstitial pulmonary disease, unspecified: Secondary | ICD-10-CM

## 2016-07-13 DIAGNOSIS — I10 Essential (primary) hypertension: Secondary | ICD-10-CM

## 2016-07-13 DIAGNOSIS — R000 Tachycardia, unspecified: Secondary | ICD-10-CM

## 2016-07-13 DIAGNOSIS — I2723 Pulmonary hypertension due to lung diseases and hypoxia: Secondary | ICD-10-CM

## 2016-07-13 DIAGNOSIS — R071 Chest pain on breathing: Secondary | ICD-10-CM

## 2016-07-13 DIAGNOSIS — J84112 Idiopathic pulmonary fibrosis: Secondary | ICD-10-CM

## 2016-07-13 HISTORY — DX: Pulmonary hypertension due to lung diseases and hypoxia: I27.23

## 2016-07-13 MED ORDER — DILTIAZEM HCL ER COATED BEADS 120 MG PO CP24: 120.0000 mg | ORAL_CAPSULE | Freq: Every day | ORAL | 6 refills | Status: AC

## 2016-07-13 MED FILL — DILTIAZEM 24HR ER 120 MG CA: 120 | 30 days supply | Qty: 30 | Fill #0

## 2016-07-13 NOTE — Telephone Encounter (Signed)
Form received, filled out and given to Dr Golden Pop nurse for his signature.  This needs to be faxed as soon as signed.

## 2016-07-13 NOTE — Telephone Encounter (Signed)
PT daughter came to the office complaining that the Pulmonology Specialist did not do any test or anything, he only saw him one time, the second  Time was sent to the ED was sent to with out any explanation, they arfe very upset with them since they get mistreat since is not communication with them, they need please another referral to someone else, please follow up with pt

## 2016-07-13 NOTE — Telephone Encounter (Signed)
[name]: I am not understanding this text  Thanks  Dr. Brand Males, M.D., [facility].C.P Pulmonary and Critical Care Medicine Staff Physician [facility] Pager: [phone], If no answer or between  15:00h - 7:00h: call [phone]  07/13/2016 8:16 AM

## 2016-07-13 NOTE — Progress Notes (Signed)
PCP: Ladell Pier, MD  Pulmonologist: Dr. Chase Caller  Clinic Note: Chief Complaint  Patient presents with  . New Evaluation    fast heart beat, doe  . Interstitial Lung Disease    HPI: [name] is a 71 y.o. male who is being seen today for the evaluation of tachycardia & Exertional Dyspnea at the request of Dr. Chase Caller.. - He is a Spanish only speaking immigrant from [place] who is here living with his daughters: [name] & [name] (limited English) - History and physical examination was performed with the assistance of licensed Spanish interpreter Long-time cigarette smoker age [age]  [name] was last seen on 06/27/2016 by Dr. Chase Caller -> he noted at least 2+ month history of cough that was severe dry hacking. Associated dyspnea at least level II. He has profound exertional dyspnea occurring on level ground and walking up a slight hill. --> 30+ pound weight loss in 2-4 months. -> Walk one lap around the office and stop due to dyspnea. Did not desaturate. Heart rate increased to 131 BPM. No chest pain  Recent Hospitalizations:   [facility] February 2018 -  CT scan showed severe interstitial fibrosis with peripheral honeycombing.  05/13/2016: Worsening dyspnea; unable to walk around the house. Pleuritic left-sided pain --> discharged on oral prednisone, [place] for home oxygen; Echo showed only moderate elevated PA pressures.  06/07/2016: Again admitted for acute hypoxic respiratory failure - > thought to related to pneumonia (however it appeared that his elevated white count was due to prednisone and not infection). He again ruled out for MI  Studies Personally Reviewed - (if available, images/films reviewed: From Epic Chart or Care Everywhere)  2-D echo 05/15/2016: EF 60 -65%. GR 1 DD. Moderate PA pressures estimated at 55 mmHg  CTA chest with contrast 05/13/2016: No pericardial effusion. Normal heart size. No comment of coronary  calcification. Biapical pleural-parenchymal scarring. Chronic underlying interstitial lung disease with prominent subpleural honeycombing bilaterally. Small pneumomediastinum likely related to rupture of peripheral air sac or bleb. Mediastinal and hilar lymphadenopathy  Interval History: [name] presents here today with an interpreter to help discuss his symptoms. Basically, he has profound dyspnea at rest. He does not necessarily note his heart rate is going fast, he denies any palpitations and since, but knows that his heart rate does go up and walks around. Just doesn't feel dizzy with it. Most notably he has dyspnea that is associated with a significant morning cough with lots of phlegm production. This gets him somewhat agitated. However with the cough he denies any chest pain or pressure. When he describes is tightness in his chest that is associated with inability to take a deep breath when he is trying to do any activity. He doesn't describe it as pain associated with short of breath it is simply a discomfort because he cannot get a good breath. Really doesn't notice any significant PND or orthopnea. He actually feels better somewhat lying down as opposed to sitting up. Sitting up is uncomfortable and therefore his heart rate goes up. He does not have any significant edema. He does get lightheaded and short of breath or hypoxic, but not routinely. No syncope or near syncope. No TIA or amaurosis fugax   No melena, hematochezia, hematuria, or epstaxis. No claudication - he really doesn't walk enough to get symptomatic.  ROS: A comprehensive was performed. With use of interpreter Review of Systems  Constitutional: Positive for malaise/fatigue and weight loss (Roughly 40 pounds in the  last several months).  HENT: Negative for congestion and nosebleeds.   Respiratory: Positive for cough (Most notably in the morning.), sputum production (Mostly yellow/white phlegm, not necessarily green. The most part  he doesn't feel like he can get the phlegm up), shortness of breath (Chronic. Actually now stable) and wheezing.   Cardiovascular:       Per history of present illness  Gastrointestinal: Negative for constipation, heartburn and melena.  Musculoskeletal: Positive for back pain.  Neurological: Negative for dizziness (When he is very short of breath) and focal weakness.  Endo/Heme/Allergies: Negative for environmental allergies.  Psychiatric/Behavioral: Negative for memory loss. The patient is not nervous/anxious and does not have insomnia.   All other systems reviewed and are negative.  I have reviewed and (if needed) personally updated the patient's problem list, medications, allergies, past medical and surgical history, social and family history.   Past Medical History:  Diagnosis Date  . GERD (gastroesophageal reflux disease)   . Hyperglycemia   . Hypertension   . IPF (idiopathic pulmonary fibrosis) ([place]) 06/27/2016  . Pulmonary fibrosis ([place])   . 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 EYE SURGERY    . HERNIA REPAIR    . TRANSTHORACIC ECHOCARDIOGRAM  05/15/2016   EF 60 -65%. GR 1 DD. Moderate PA pressures estimated at 55 mmHg    Current Meds  Medication Sig  . calcium-vitamin D (OSCAL WITH D) 500-200 MG-UNIT tablet Take 1 tablet by mouth 2 (two) times daily.  . Glycopyrrolate-Formoterol (BEVESPI AEROSPHERE) 9-4.8 MCG/ACT AERO Inhale 2 puffs into the lungs 2 (two) times daily.   Marland Kitchen omeprazole (PRILOSEC) 20 MG capsule Take 1 capsule by mouth daily.  Marland Kitchen omeprazole (PRILOSEC) 40 MG capsule Take 1 capsule (40 mg total) by mouth daily.  . predniSONE (DELTASONE) 10 MG tablet Take 2 tabs daily  . predniSONE (STERAPRED UNI-PAK 21 TAB) 10 MG (21) TBPK tablet Take 10 mg by mouth daily.  . [DISCONTINUED] losartan (COZAAR) 25 MG tablet Take 1 tablet (25 mg total) by mouth daily.    Allergies  Allergen  Reactions  . Cyclobenzaprine Swelling    Tongue and lips swell    Social History   Social History  . Marital status: Married    Spouse name: N/A  . Number of children: N/A  . Years of education: N/A   Social History Main Topics  . Smoking status: Former Smoker    Packs/day: 2.00    Years: 40.00    Quit date: 05/03/2001  . Smokeless tobacco: Never Used  . Alcohol use No  . Drug use: No  . Sexual activity: Not Asked   Other Topics Concern  . None   Social History Narrative   Lives with others.   Unemployed       family history includes Heart attack in his mother. -> he is otherwise pretty much unaware of any history of COPD or coronary disease in his family.  Wt Readings from Last 3 Encounters:  07/13/16 117 lb (53.1 kg)  06/27/16 117 lb 6.4 oz (53.3 kg)  06/20/16 118 lb (53.5 kg)    PHYSICAL EXAM BP 110/77 (BP Location: Left Arm, Patient Position: Sitting, Cuff Size: Normal)   Pulse (!) 127   Ht _0  (1.626 m)   Wt 117 lb (53.1 kg)   SpO2 91%   BMI 20.08 kg/m  General appearance: Chronically ill appearing gentleman that is quite thin and  frail. Not cachectic. He is in no acute distress. Awake and alert. HEENT: [place]/AT, EOMI, MMM, anicteric sclera, but arcus senilis; very poor dentition with several missing teeth. Neck: no adenopathy, no carotid bruit and difficult to assess, but no obvious JVD Lungs: Mild accessory muscle use, but nonlabored. Diffuse coarse/dry rhonchorous breath sounds with some expiratory wheezing. No rales. Heart: Tachycardic, distant heart sounds. Relatively normal S1 and S2. No M/R/G. Nondisplaced PMI. Abdomen: Scaphoid. soft, non-tender; bowel sounds normal; no masses,  no organomegaly; no HJR Extremities: extremities normal, atraumatic, no cyanosis, or edema;  + clubbing; no femoral bruits Pulses: 2+ and symmetric; somewhat diminished in thready pedal pulses. Skin: no evidence of bleeding or bruising and no lesions noted no rashes or  pallor Neurologic: Mental status: Alert & oriented x 3, thought content appropriate; non-focal exam.  Pleasant mood & affect. Cranial nerves: normal (II-XII grossly intact)    Adult ECG Report  Rate: 122 ;  Rhythm: sinus tachycardia, premature ventricular contractions (PVC) and Normal axis, intervals and durations;   Narrative Interpretation: Essentially stable EKG -> by the time I exam, his heart rate was down in the 90s   Other studies Reviewed: Additional studies/ records that were reviewed today include:  Recent Labs:   Lab Results  Component Value Date   CREATININE 0.80 06/09/2016   BUN 15 06/09/2016   NA 137 06/09/2016   K 4.3 06/09/2016   CL 101 06/09/2016   CO2 27 06/09/2016    ASSESSMENT / PLAN: Problem List Items Addressed This Visit    Chest pain on breathing    The Chest pain he is describing sounds more consistent with difficulty breathing. With internal reassess him in a few months after starting rate control medication. There was no evidence of coronary calcification noted on chest CT scan.  If he is still noticing symptoms on follow-up with controlled heart rate, we will consider ischemic evaluation with a Myoview stress test.      HTN (hypertension) (Chronic)    Borderline hypotensive today. He is on losartan which I convert to diltiazem XL 120 mg daily.      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 This is probably related to his lung disease and not primary pulmonary hypertension. Mainstay of treatment is oxygen and medications per pulmonology.   I will switch his ARB to diltiazem for some vasodilatory effect and to help his heart rate.      Relevant Medications   diltiazem  (CARDIZEM CD) 120 MG 24 hr capsule   Other Relevant Orders   EKG 12-Lead (Completed)   Sinus tachycardia - Primary (Chronic)    He has resting sinus tachycardia, possibly because of his dyspnea and hypoxia. Echocardiogram really was unrevealing as far as any abnormalities. Likely compensatory - he did not get hypoxic with tachycardia - but simply noted dyspnea. We will plan for rate control.  With a resting heart rate in the 120s to 130s and not having anginal symptoms, I am reluctant this time to consider stress test. Plan for now will be simply converting from losartan to diltiazem for some rate control.      Relevant Orders   EKG 12-Lead (Completed)     - If symptoms were to exacerbate,  we will proceed with ischemic evaluation prior to planned follow-up.  Current medicines are reviewed at length with the patient today. (+/- concerns) n/a The following changes have been made: change from Losartan to Diltiazem.  Patient Instructions  MEDICATION  STOP TAKING LOSARTAN  START TAKING DILTIAZEM ER 120 MG ONE CAPSULE ONE DAILY     Your physician recommends that you schedule a follow-up appointment in 2-3 MONTH WITH DR HARDING       Studies Ordered:   Orders Placed This Encounter  Procedures  . EKG 12-Lead      Glenetta Hew, M.D., M.S. Interventional Cardiologist   Pager # [phone] Phone # [phone] [address] [place], [place] [address]

## 2016-07-13 NOTE — Patient Instructions (Signed)
MEDICATION  STOP TAKING LOSARTAN  START TAKING DILTIAZEM ER 120 MG ONE CAPSULE ONE DAILY     Your physician recommends that you schedule a follow-up appointment in 2-3 [place]

## 2016-07-13 NOTE — Telephone Encounter (Signed)
Received call from pt's grand daughter, [name]. She is requesting the results of the ONO, advised her we have attempted to reach [facility] to get the ONO faxed to [place]. Called [name] with [facility] and she states she will have someone send the results or call us tomorrow.   [name] also states the pt's [name] has not been delivered yet. Advised her that we have forms we just received on 07/12/16 and MR signed them today and they will be faxed today. [name] verbalized understanding and denied any further questions or concerns at this time.   Will await ONO results to come in on 07/14/2016. [name] is requesting these results before the weekend.

## 2016-07-14 DIAGNOSIS — J9611 Chronic respiratory failure with hypoxia: Secondary | ICD-10-CM | POA: Insufficient documentation

## 2016-07-14 NOTE — Telephone Encounter (Signed)
lmtcb X1 for pt's granddaughter. Will order O2 and repeat ONO after speaking to pt's granddaughter.

## 2016-07-14 NOTE — Telephone Encounter (Signed)
ONO has been placed in MW look at.  MW  - please advise. Thanks!

## 2016-07-14 NOTE — Telephone Encounter (Signed)
[name], returned call, CB is [phone]

## 2016-07-14 NOTE — Telephone Encounter (Signed)
[name] called back and he is aware that the pt will need to be started on oxygen at bedtime. He is aware that I will get the order sent over to [facility] and they will contact the pt to get him set up with the oxgyen at night.

## 2016-07-14 NOTE — Telephone Encounter (Signed)
This has been signed and faxed back to [facility]. Will await PA.

## 2016-07-14 NOTE — Telephone Encounter (Signed)
ONO has been received. Results are located in MR's look at. MR is not available to review these results.  MW - would you be willing to review these? Thanks.

## 2016-07-14 NOTE — Telephone Encounter (Signed)
Will submit referral per family request to different pulmonary.  However, I did send Dr. Chase Caller e-mail yesterday about the pt and was awaiting his reply.  I will have our case worker Opal Sidles f/u with pt on Monday when she is back in office.

## 2016-07-14 NOTE — Telephone Encounter (Signed)
Granddaughter [name] [phone]) called about results...ert

## 2016-07-14 NOTE — Telephone Encounter (Signed)
desat < 89% x 1h:29mso  rec 2lpm hs and repeat ono on 2lpm

## 2016-07-14 NOTE — Telephone Encounter (Signed)
Ok to put in my lookat

## 2016-07-14 NOTE — Telephone Encounter (Signed)
Spoke with pt's granddaughter, [name]. She is not listed on pt's DPR. I advised her that I could not release these results to her. She became very upset and states that the people who are listed do not speak good [place]. [name] will have one of the listed people call to get the results.

## 2016-07-15 ENCOUNTER — Emergency Department (HOSPITAL_COMMUNITY)
Admission: EM | Admit: 2016-07-15 | Discharge: 2016-07-15 | Disposition: A | Discharge status: Home / Self Care | Payer: Self-pay | Attending: Emergency Medicine | Admitting: Emergency Medicine

## 2016-07-15 ENCOUNTER — Other Ambulatory Visit: Payer: Self-pay

## 2016-07-15 ENCOUNTER — Emergency Department (HOSPITAL_COMMUNITY): Payer: Self-pay

## 2016-07-15 ENCOUNTER — Encounter: Payer: Self-pay | Admitting: Cardiology

## 2016-07-15 ENCOUNTER — Encounter (HOSPITAL_COMMUNITY): Payer: Self-pay | Admitting: Emergency Medicine

## 2016-07-15 DIAGNOSIS — R079 Chest pain, unspecified: Secondary | ICD-10-CM

## 2016-07-15 DIAGNOSIS — Z87891 Personal history of nicotine dependence: Secondary | ICD-10-CM | POA: Insufficient documentation

## 2016-07-15 DIAGNOSIS — I2723 Pulmonary hypertension due to lung diseases and hypoxia: Secondary | ICD-10-CM | POA: Insufficient documentation

## 2016-07-15 DIAGNOSIS — J8410 Pulmonary fibrosis, unspecified: Secondary | ICD-10-CM

## 2016-07-15 LAB — CBC
HCT: 42.1 % (ref 39.0–52.0)
Hemoglobin: 14.4 g/dL (ref 13.0–17.0)
MCH: 31.2 pg (ref 26.0–34.0)
MCHC: 34.2 g/dL (ref 30.0–36.0)
MCV: 91.1 fL (ref 78.0–100.0)
PLATELETS: 315 10*3/uL (ref 150–400)
RBC: 4.62 MIL/uL (ref 4.22–5.81)
RDW: 14.2 % (ref 11.5–15.5)
WBC: 15 10*3/uL — AB (ref 4.0–10.5)

## 2016-07-15 LAB — BASIC METABOLIC PANEL
Anion gap: 9 (ref 5–15)
BUN: 15 mg/dL (ref 6–20)
CALCIUM: 8.6 mg/dL — AB (ref 8.9–10.3)
CHLORIDE: 97 mmol/L — AB (ref 101–111)
CO2: 27 mmol/L (ref 22–32)
Creatinine, Ser: 1.01 mg/dL (ref 0.61–1.24)
GFR calc Af Amer: >60 mL/min (ref >60)
Glucose, Bld: 175 mg/dL — ABNORMAL HIGH (ref 65–99)
Potassium: 4.1 mmol/L (ref 3.5–5.1)
SODIUM: 133 mmol/L — AB (ref 135–145)

## 2016-07-15 LAB — I-STAT TROPONIN, ED: TROPONIN I, POC: 0 ng/mL (ref 0.00–0.08)

## 2016-07-15 MED ORDER — ALBUTEROL SULFATE (2.5 MG/3ML) 0.083% IN NEBU: 2.5000 mg | INHALATION_SOLUTION | RESPIRATORY_TRACT | 0 refills | Status: AC | PRN

## 2016-07-15 MED ORDER — ALBUTEROL SULFATE (2.5 MG/3ML) 0.083% IN NEBU
5.0000 mg | INHALATION_SOLUTION | Freq: Once | RESPIRATORY_TRACT | Status: AC
  Administered 2016-07-15: 5 mg via RESPIRATORY_TRACT

## 2016-07-15 MED ORDER — IPRATROPIUM-ALBUTEROL 0.5-2.5 (3) MG/3ML IN SOLN
3.0000 mL | Freq: Once | RESPIRATORY_TRACT | Status: AC
  Administered 2016-07-15: 3 mL via RESPIRATORY_TRACT
  Filled 2016-07-15: qty 3

## 2016-07-15 MED ORDER — ALBUTEROL SULFATE HFA 108 (90 BASE) MCG/ACT IN AERS: 1.0000 | INHALATION_SPRAY | RESPIRATORY_TRACT | 2 refills | Status: AC | PRN

## 2016-07-15 MED ORDER — ALBUTEROL SULFATE (2.5 MG/3ML) 0.083% IN NEBU
INHALATION_SOLUTION | RESPIRATORY_TRACT | Status: AC
  Filled 2016-07-15: qty 6

## 2016-07-15 MED ORDER — AEROCHAMBER PLUS FLO-VU MEDIUM MISC: 1.0000 | Freq: Once | 0 refills | Status: AC

## 2016-07-15 MED ORDER — ALBUTEROL SULFATE (2.5 MG/3ML) 0.083% IN NEBU
5.0000 mg | INHALATION_SOLUTION | Freq: Once | RESPIRATORY_TRACT | Status: AC
  Administered 2016-07-15: 5 mg via RESPIRATORY_TRACT
  Filled 2016-07-15: qty 6

## 2016-07-15 NOTE — ED Notes (Signed)
Pt returns from radiology cont. To be tele.

## 2016-07-15 NOTE — Assessment & Plan Note (Signed)
Borderline hypotensive today. He is on losartan which I convert to diltiazem XL 120 mg daily.

## 2016-07-15 NOTE — Assessment & Plan Note (Signed)
Chest pain is describing sounds more consistent with difficulty breathing. With internal reassess him in a few months after starting rate control medication. If he is still noticing symptoms, we can then consider ischemic evaluation. There is no evidence of coronary calcification noted on chest CT scan. 

## 2016-07-15 NOTE — ED Notes (Signed)
Daniel Ogle, PA to bedside to speak with patient about discharge.

## 2016-07-15 NOTE — ED Provider Notes (Signed)
Daniel DEPT Provider Note   CSN: [id number] Arrival date & time: 07/15/16  1601     History   Chief Complaint Chief Complaint  Patient Daniel with  . Cervantes Pain    HPI Supreme Rybarczyk is a 71 y.o. male.  HPI  71 y.o. male with a hx of Cervantes, [name], [name], [name] pain that started x 1 week ago. Pt states that he has [name] and was just approved for home oxygen use, but has not received the oxygen tank yet. Notes Cervantes pain constantly with worsening on exertion. Improved with rest. Denies Cervantes pain currently. No N/V. No fevers. No diaphoresis. No URI symptoms. Notes chronic cough x several months. No abdominal pain. No diarrhea. Pt arrived to triage at 88% on RA. Seen by Cardiology on 07-13-16 noted tachycardia and exertional dyspnea at rest. HR 120s in office and appears baseline and as a result of [name] causing hypoxia. Pt was switched to Diltiazem 141m XL Daily with improvement of HR. Cardiology noted possible Myoview stress in the future. No other symptoms noted.   CTA Cervantes with contrast 05/13/2016: No pericardial effusion. Normal heart size. No comment of coronary calcification. Biapical pleural-parenchymal scarring. Chronic underlying interstitial lung disease with prominent subpleural honeycombing bilaterally. Small pneumomediastinum likely related to rupture of peripheral air sac or bleb. Mediastinal and hilar lymphadenopathy There is no evidence of coronary calcification noted on Cervantes CT scan  2-D echo 05/15/2016: EF 60 -65%. GR 1 DD. Moderate PA pressures estimated at 55 mmHg  Past Medical History:  Diagnosis Date  . GERD (gastroesophageal reflux disease)   . Hyperglycemia   . Hypertension   . IPF (idiopathic [name]) ([place] 06/27/2016  . [name] ([place]   . 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 Chronic respiratory failure with hypoxia/ noct desats 07/14/2016  . 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 IPF (idiopathic [name]) ([place] 06/27/2016  . 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 Acute respiratory distress 06/07/2016  . HCAP (healthcare-associated pneumonia) 06/07/2016  . Sepsis ([place] 06/07/2016  . Cervantes (hypertension) 06/07/2016  . Anemia   . Hyperglycemia   . Cervantes pain on breathing   . Pneumomediastinum (HP[place]   . Protein-calorie malnutrition, severe 05/15/2016  . Pneumonitis   . Interstitial lung disease ([place] 05/13/2016  . ILD (interstitial lung disease) (HP[place] 05/12/2016  . Weight loss, abnormal 05/12/2016  . Cervantes pain 05/12/2016  . Sinus tachycardia 05/12/2016  . Dyspnea and respiratory abnormalities 05/12/2016    Past Surgical History:  Procedure Laterality Date  . EYE SURGERY    . HERNIA REPAIR    . TRANSTHORACIC ECHOCARDIOGRAM  05/15/2016   EF 60 -65%. GR 1 DD. Moderate PA pressures estimated at 55 mmHg       Home Medications    Prior to Admission medications   Medication Sig Start Date End Date Taking? Authorizing Provider  calcium-vitamin D (OSCAL WITH D) 500-200 MG-UNIT tablet Take 1 tablet by mouth 2 (two) times daily. 06/20/16   JLadell Pier MD  diltiazem (CARDIZEM CD) 120 MG 24 hr capsule Take 1 capsule (120 mg total) by mouth daily. 07/13/16 10/11/16  HLeonie Man MD  Glycopyrrolate-Formoterol (BEVESPI AEROSPHERE) 9-4.8 MCG/ACT AERO Inhale 2 puffs into the lungs 2 (two) times daily.     [provider]  omeprazole (PRILOSEC) 20 MG capsule Take 1 capsule by mouth daily. 06/14/16   [provider]  omeprazole (PRILOSEC) 40 MG capsule Take 1 capsule (40 mg total) by mouth daily. 06/20/16 06/20/17  Ladell Pier, MD  predniSONE (DELTASONE) 10 MG tablet Take 2 tabs daily 06/27/16   Brand Males, MD  predniSONE  (STERAPRED UNI-PAK 21 TAB) 10 MG (21) TBPK tablet Take 10 mg by mouth daily. 06/20/16   [provider]    Family History Family History  Problem Relation Age of Onset  . Heart attack Mother     Social History Social History  Substance Use Topics  . Smoking status: Former Smoker    Packs/day: 2.00    Years: 40.00    Quit date: 05/03/2001  . Smokeless tobacco: Never Used  . Alcohol use No     Allergies   Cyclobenzaprine   Review of Systems Review of Systems ROS reviewed and all are negative for acute change except as noted in the HPI.  Physical Exam Updated Vital Signs BP 115/73   Pulse (!) 110   Temp 97.6 F (36.4 C) (Oral)   Resp (!) 30   SpO2 96%   Physical Exam  Constitutional: He is oriented to person, place, and time. Vital signs are normal. He appears well-developed and well-nourished.  HENT:  Head: Normocephalic and atraumatic.  Right Ear: Hearing normal.  Left Ear: Hearing normal.  Eyes: Conjunctivae and EOM are normal. Pupils are equal, round, and reactive to light.  Neck: Normal range of motion. Neck supple.  Cardiovascular: Regular rhythm, normal heart sounds and intact distal pulses.  Tachycardia present.   Daniel/Cervantes: Effort normal. He has wheezes in the right upper field, the right lower field, the left upper field and the left lower field. He has rales in the right upper field, the right lower field, the left upper field and the left lower field.  Abdominal: Soft. Bowel sounds are normal. There is no tenderness.  Musculoskeletal: Normal range of motion.  Neurological: He is alert and oriented to person, place, and time.  Skin: Skin is warm and dry.  Psychiatric: He has a normal mood and affect. His speech is normal and behavior is normal. Thought content normal.  Nursing note and vitals reviewed.  ED Treatments / Results  Labs (all labs ordered are listed, but only abnormal results are displayed) Labs Reviewed  BASIC METABOLIC  PANEL - Abnormal; Notable for the following:       Result Value   Sodium 133 (*)    Chloride 97 (*)    Glucose, Bld 175 (*)    Calcium 8.6 (*)    All other components within normal limits  CBC - Abnormal; Notable for the following:    WBC 15.0 (*)    All other components within normal limits  I-STAT TROPOININ, ED    EKG  EKG Interpretation  Date/Time:  Saturday July 15 2016 16:06:11 EDT Ventricular Rate:  123 PR Interval:  156 QRS Duration: 82 QT Interval:  286 QTC Calculation: 409 R Axis:   84 Text Interpretation:  Sinus tachycardia Otherwise normal ECG similar to previous Confirmed by Theotis Burrow [id number]) on 07/15/2016 4:24:54 PM       Radiology Dg Cervantes 2 View  Result Date: 07/15/2016 CLINICAL DATA:  Shortness of breath and Cervantes pain EXAM: Cervantes  2 VIEW COMPARISON:  Cervantes radiograph 06/07/2016 Cervantes CT 05/13/2016 FINDINGS: Diffuse reticular opacities, worse on the left, are unchanged compared to the  prior radiograph. No pneumothorax or pleural effusion. No superimposed focal consolidation. Thoracolumbar compression deformity is unchanged. IMPRESSION: Unchanged appearance of advanced [name] without superimposed acute abnormality. Electronically Signed   By: Ulyses Jarred M.D.   On: 07/15/2016 17:16    Procedures Procedures (including critical care time)  Medications Ordered in ED Medications  albuterol (PROVENTIL) (2.5 MG/3ML) 0.083% nebulizer solution (not administered)  albuterol (PROVENTIL) (2.5 MG/3ML) 0.083% nebulizer solution 5 mg (not administered)  albuterol (PROVENTIL) (2.5 MG/3ML) 0.083% nebulizer solution 5 mg (5 mg Nebulization Given 07/15/16 1617)  ipratropium-albuterol (DUONEB) 0.5-2.5 (3) MG/3ML nebulizer solution 3 mL (3 mLs Nebulization Given 07/15/16 1925)  albuterol (PROVENTIL) (2.5 MG/3ML) 0.083% nebulizer solution 5 mg (5 mg Nebulization Given 07/15/16 2020)     Initial Impression / Assessment and Plan / ED Course  I have reviewed the  triage vital signs and the nursing notes.  Pertinent labs & imaging results that were available during my care of the patient were reviewed by me and considered in my medical decision making (see chart for details).  Final Clinical Impressions(s) / ED Diagnoses  {I have reviewed and evaluated the relevant laboratory values. {I have reviewed and evaluated the relevant imaging studies. {I have interpreted the relevant EKG. {I have reviewed the relevant previous healthcare records.  {I obtained HPI from historian. {Patient discussed with supervising physician.  ED Course:  Assessment: Pt is a 71 y.o. male with hx Cervantes, [name], [name] who Daniel with Cervantes pain with shortness of breath x 1 week. No Cervantes pain currently. No N/V. No diaphoresis. Notes worsening with exertion. Improved with rest. Seen by Cardiology on 07-13-16 (Dr. Ellyn Hack). Likely related to [name]. CT Cervantes without evidence of coronary calcification previously. Doubt ACS per Cardiology. Noted sinus tachycardia 2/2 hyopxia from Cervantes. Switched to diltiazem yesterday with improvement of HR. Denies fevers. Noted chronic cough x several months. On exam, pt in NAD. Nontoxic/nonseptic appearing. VSS with the exception of HR 112. Afebrile. Lungs diffuse rales Abdomen nontender soft. EKG unremarkable. Trop negative. CBC with leukocytosis, but on chronic prednisone therapy. BMP unremarkable. CXR baseline with [name]. Discussed with attending physician.   7:06 PM Discussed with Pulmonology. Will give neb treatments in ED and attempt to raise O2 saturations. If low 90s will DC home with scheduled albuterol treatments q4-6 hours until Monday where he can notify office of O2 Tank. Case manager not available over weekends.    9:05 PM- Improved O2 saturations after nebulizer treatments with O2 saturations 94-95% on RA. Will DC home with albuterol and spacer with q4-6h treatments until follow up on Monday.    At time of discharge, Patient is in no acute distress. Vital Signs are stable. Patient is able to ambulate. Patient able to tolerate PO.   10:45 PM- O2 saturations low 82-85% while ambulating. Normalized on RA at rest. Discussed with attending physician. This is expected given patient's hx [name]. Encouraged breathing treatments as per pulmonology consult and call office on Monday for status of oxygen tank    Disposition/Plan:  DC Home Additional Verbal discharge instructions given and discussed with patient.  Pt Instructed to f/u with PCP in the next week for evaluation and treatment of symptoms. Return precautions given Pt acknowledges and agrees with plan  Supervising Physician Little, Wenda Overland, MD  Final diagnoses:  Cervantes pain, unspecified type  [name] ([place])    New Prescriptions New Prescriptions   No medications on file     Shary Decamp, PA-C  07/15/16 2246    Little, Wenda Overland, MD 07/20/16 1108

## 2016-07-15 NOTE — ED Notes (Signed)
States has been using inhalers and nebs without any improvement.

## 2016-07-15 NOTE — Assessment & Plan Note (Signed)
By echocardiogram, he does have some evidence of elevated pulmonary pressures. This is probably related to his lung disease and not primary pulmonary hypertension. Mainstay of treatment is oxygen and medications per pulmonology.   I will switch his ARB to diltiazem for some vasodilatory effect and to help his heart rate.

## 2016-07-15 NOTE — Discharge Instructions (Signed)
Please read and follow all provided instructions.  Your diagnoses today include:  1. Chest pain, unspecified type   2. Pulmonary fibrosis ([place])     Tests performed today include: Vital signs. See below for your results today.   Medications prescribed:  Take as prescribed   Home care instructions:  Follow any educational materials contained in this packet.  Follow-up instructions: Please follow-up with your primary care provider for further evaluation of symptoms and treatment   Return instructions:  Please return to the Emergency Department if you do not get better, if you get worse, or new symptoms OR  - Fever (temperature greater than 101.42F)  - Bleeding that does not stop with holding pressure to the area    -Severe pain (please note that you may be more sore the day after your accident)  - Chest Pain  - Difficulty breathing  - Severe nausea or vomiting  - Inability to tolerate food and liquids  - Passing out  - Skin becoming red around your wounds  - Change in mental status (confusion or lethargy)  - New numbness or weakness    Please return if you have any other emergent concerns.  Additional Information:  Your vital signs today were: BP 115/73    Pulse (!) 110    Temp 97.6 F (36.4 C) (Oral)    Resp (!) 30    SpO2 96%  If your blood pressure (BP) was elevated above 135/85 this visit, please have this repeated by your doctor within one month. ---------------

## 2016-07-15 NOTE — Assessment & Plan Note (Signed)
He has resting sinus tachycardia, possibly because of his dyspnea and hypoxia. Echocardiogram really was unrevealing as far as any abnormalities. Likely compensatory - he did not get hypoxic with tachycardia - but simply noted dyspnea. We will plan for rate control.  With a resting heart rate in the 120s to 130s and not having anginal symptoms, I am reluctant this time to consider stress test. Plan for now will be simply converting from losartan to diltiazem for some rate control.

## 2016-07-15 NOTE — Assessment & Plan Note (Deleted)
Chest pain is describing sounds more consistent with difficulty breathing. With internal reassess him in a few months after starting rate control medication. If he is still noticing symptoms, we can then consider ischemic evaluation. There is no evidence of coronary calcification noted on chest CT scan.

## 2016-07-15 NOTE — ED Notes (Signed)
ED Tech prepared patient for discharge. Afterward patient ambulated 37f to restroom and back. Then appeared very short of breath, per ED Tech. Pulse oximeter reattached. O2 Saturation @ 82% with HR @ 140. TDorothea Ogle PUtahinformed.

## 2016-07-15 NOTE — Assessment & Plan Note (Addendum)
The Chest pain he is describing sounds more consistent with difficulty breathing. With internal reassess him in a few months after starting rate control medication. There was no evidence of coronary calcification noted on chest CT scan.  If he is still noticing symptoms on follow-up with controlled heart rate, we will consider ischemic evaluation with a Myoview stress test.

## 2016-07-15 NOTE — ED Triage Notes (Signed)
Pt to ER for generalized chest pain that started one week ago. Hx of pulmonary fibrosis. Pt family states patient was "just approved for O2 use at home but has not received the tank." Respirations labored, patient able to speak in fragments, O2 88% on RA. Placed on 2L, immediate improvement.

## 2016-07-17 ENCOUNTER — Telehealth: Payer: Self-pay | Admitting: Internal Medicine

## 2016-07-17 NOTE — Telephone Encounter (Signed)
After review of recent encounters, call placed to [facility] Pulmonary office and spoke to [place] regarding the status of the O2 order. She stated that the referral for home O2 was sent to [facility] and they have confirmed receipt of the order and will be contacting the patient.   Call placed to the patient's daughter, Lenell Antu, with the assistance of Kellie Moor, [facility] and informed the daughter of the status of the O2 order and noted that [facility] should be contacting her father about delivery. She was very appreciative of the assistance and stated that they were not interested in changing pulmonologists at this time.  Message routed to Dr Wynetta Emery.

## 2016-07-17 NOTE — Telephone Encounter (Signed)
Spoke with [name], the pt's grand daughter  She states that she called the drug company to check on [place] delivery  They advised her that the rx we sent was wrong, and that they have reached out to [place] for clarification  I do not see that they called though, Daneil Dan- do you have any forms on him?

## 2016-07-17 NOTE — Telephone Encounter (Signed)
Case worker has spoken to daughter today. Pt will be getting his O2.  She requested cancel referral to different pulmonary specialist.

## 2016-07-17 NOTE — Addendum Note (Signed)
Addended by: Karle Plumber B on: 07/17/2016 09:06 PM   Modules accepted: Orders

## 2016-07-18 NOTE — Telephone Encounter (Signed)
[name] advised the pt when he called back about the medication being approved and the company will contact him about shipping the medication to him.

## 2016-07-18 NOTE — Telephone Encounter (Signed)
Called [facility] at [phone] and spoke with Shristi, who verified that application has been approved and that the rx is valid- verified that pt is to only be titrated up to 2 tabs TID instead of the standard 3 tabs TID.  Per Shristi, they will be reaching out to the patient to schedule drug shipment now that this has been clarified.    lmtcb X1 for pt to make aware.

## 2016-07-18 NOTE — Telephone Encounter (Signed)
Let pt know that med has been approved and that thecomp. Should be reaching out to them to make shipment arrangements.[name]

## 2016-07-19 ENCOUNTER — Telehealth: Payer: Self-pay | Admitting: Internal Medicine

## 2016-07-19 ENCOUNTER — Telehealth: Payer: Self-pay

## 2016-07-19 NOTE — Telephone Encounter (Signed)
Called and spoke to pt's grand daughter, [name]. Pt is requesting portable O2. Advised her the pt does not qualify and would need to be re-evaluated. [name] states the pt is even more dyspneic than before. Advised her that Mr amy open office on 6/29 and we could work pt in. Will see on 6/28 if MR is going to open office on 6/29.   Will forward to my box to follow.

## 2016-07-19 NOTE — Telephone Encounter (Signed)
Met with the patient's daughters, Lenell Antu and Dorisann Frames , when they came to the clinic today.   Gaetano Net, [facility] scheduler assisted with spanish interpretation.  The daughters stated that they received the O2 from [facility] but it was only ordered for night time use and they feel that he needs it during the day. Instructed them that [name] must be contacted to inquire about the O2 orders and noted that this CM will call the office for them.  Also encouraged them to have their father sign a DPR and they also said that the are interested in obtaining a legal POA.   Call placed to Dr Golden Pop office and spoke to [place]. Informed her that the patient's daughters think that their father needs O2 during the day not just at night. She stated that she will send a message to [name] with the information and they will follow up with [facility] if the order is to be changed. She will also notify the family.  Message routed to Dr Wynetta Emery.

## 2016-07-19 NOTE — Telephone Encounter (Signed)
Granddaughter [name] [phone]) requesting an order for portable oxygen, states pt is out of breathing when stating needs oxygen continuously instead of at nighttime...ert

## 2016-07-19 NOTE — Telephone Encounter (Signed)
See phone note from 6.25.18. Will sign off.

## 2016-07-19 NOTE — Telephone Encounter (Signed)
Call received from [facility] with Dr Golden Pop office. She stated that she received the message from this CM and she noted that the patient has not qualified for O2 use during the day and this has been explained to the patient's daughters. She stated that she would contact the family again to explain.

## 2016-07-19 NOTE — Telephone Encounter (Signed)
Spoke with Opal Sidles at [employer] and wellness, states that pt's family came in today requesting an order of O2 during the day, but only received an order for qhs.  I advised Opal Sidles that pt has been tested for daytime O2 and does not qualify.  Opal Sidles expressed understanding.  ATC pt, pt's granddaughter answered (NOT ON DPR), was given Octavio's number- [phone].  Called Octavio to check status of patient- Olga Coaster states that he will have to speak to patient and call me back.  Will await call back.  If pt's symptoms have worsened, he needs to come in for appt- MR has openings this afternoon that can be offered to pt for further evaluation.

## 2016-07-20 NOTE — Telephone Encounter (Signed)
lmtcb for [employer]. Pt has an appt with MR at 10:00 am on 07/21/16.

## 2016-07-20 NOTE — Telephone Encounter (Signed)
EE please advise if MR will be opening his scheduled and if so, can we schedule this pt.  thanks

## 2016-07-20 NOTE — Telephone Encounter (Signed)
Office opened for 07/21/16 AM

## 2016-07-20 NOTE — Telephone Encounter (Signed)
Schedule for tomorrow has not been opened yet.

## 2016-07-21 ENCOUNTER — Ambulatory Visit (INDEPENDENT_AMBULATORY_CARE_PROVIDER_SITE_OTHER): Payer: Self-pay | Admitting: Internal Medicine

## 2016-07-21 VITALS — BP 102/70 | HR 110 | Ht 64.0 in | Wt 117.2 lb

## 2016-07-21 DIAGNOSIS — J84112 Idiopathic pulmonary fibrosis: Secondary | ICD-10-CM

## 2016-07-21 DIAGNOSIS — J9611 Chronic respiratory failure with hypoxia: Secondary | ICD-10-CM

## 2016-07-21 MED ORDER — GLYCOPYRROLATE-FORMOTEROL 9-4.8 MCG/ACT IN AERO: 2.0000 | INHALATION_SPRAY | Freq: Two times a day (BID) | RESPIRATORY_TRACT | 5 refills | Status: AC

## 2016-07-21 MED FILL — !BEVESPI AEROSPHERE INHALER: 9-4.8 | 15 days supply | Qty: 2 | Fill #0

## 2016-07-21 NOTE — Progress Notes (Signed)
Subjective:     Patient ID: [name], male   DOB: 05-Nov-1945, 71 y.o.   MRN: [id number]  HPI   PCP No PCP Per Patient  HPI    IOV 05/12/2016  Chief Complaint  Patient presents with  . Pulmonary Consult    SOB more lately, doctor in [facility] gave pt [place] and he does take it everyday but no reflief    71 year old [place]. He has been living and [place] with his daughters Lenell Antu and [place] since 2004. 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 He is been referred for interstitial lung disease.  History according to the [employer] of chest physicians interstitial lung disease questionnaire - filled in by daughters . PAtint speaks no english. Daughters limited english  He reports a two-month history of cough that is that on most days. It is severe and it interferes with his activity. It is a dry cough in quality. There is associated shortness of breath that is at least level II which is a gets short of breath when hurrying on level ground or walking up a slight hill. Symptoms are associated with 30 pound weight loss over the last 2-4 months. Also with dysphagia and heartburn and dry mouth. But he denies any rash or edema or sensitivity to light and wheezing or oral ulcers or chest pain [other than some left infra-axillary pain]   In terms of personal exposure history: He denies any street drug use. He used to smoke cigarettes started smoking at age 32 smoked until age56;  40 cigarettes per day.   Family history: He denies any emphysema, COPD asthma, sarcoidosis, cystic fibrosis, pulmonary fibrosis, hypersensitivity pneumonitis. He does not live in a old house. 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 They deny any work in a  minor quarry are all [employer] or a foundry   Pulmonary toxicity history: He denies. No radiation exposure.  He presented to urgent care and at [facility]/07/2016:I do not have the images with me. But I reviewed the chart. His creatinine was 1.0 mg percent. His CT is reported as severe interstitial fibrosis with peripheral honeycombing. No pleural or pericardial effusion. This is the wall distal esophagus and esophagitis. Negative upper abdomen. He did get IV contrast. No mass noted  Walking desaturation test 185 feet 3 laps on room air:   He walked only 1 lap and stopped due to shortness of breath. Did not desaturate. HR 131/min. Denied chest pain   05/30/2016 Follow up : Post hospital follow up -patient is accompanied by a a family friend that is interpreting . Patient presents for a two-week follow-up. Patient was seen for a pulmonary consult 05/12/2016 for pulmonary fibrosis workup. She was admitted 05/13/2016 for progressive shortness of breath. Patient underwent a CT chest that was negative for PE but showed a small pneumomediastinum potentially due to rupture of a peripheral bleb. 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 ILD workup showed a negative ANA, anti-DNA antibody, rheumatoid factor, CCP, Sjogren's, scleroderma. C ANCA was mildly positive . 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 -c/w [facility] . ESR 95.  Patient is from [place] and worked in [place] most of his life. 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  Since discharge. Patient says he is feeling some better. Patient was started on prednisone 40 mg daily. Shortness of breath has decreased. Patient does get winded with walking.  Has some dry cough.   OV  06/27/2016  Chief Complaint  Patient presents with  . Follow-up    Pt states his SOB has worsened since last OV. Pt c/o cough with  little mucus production. Pt denies f/c/s and CP/tightness.    Follow-up interstitial lung disease - male gender, age greater than 69 presence of clubbing, UIP pattern on CT chest, perform work and exposure to ceramics  He presents with his daughter-in-law. His daughters are not here. We had to use [employer] interpreter. Patient tells me that with prednisone 20 mg per day he might be better. At first he said he is better. Then he changes to redo that he is not better. He just has to walk 50 feet and he gets very fatigued and short of breath and is unable to walk any further . He is currently on prednisone 20 mg per day. Walking desaturation test on 06/27/2016 185 feet x 3 laps on RA - did only 1 LAP and stopped due to [facility] SEVERE:  did  NOT  desaturate. Rest pulse ox was 97%, final pulse ox was 93. HR response 125/min at rest to 139/min at peak exertion. He did not have chest pain when he walked. 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    has a past medical history of GERD (gastroesophageal reflux disease); Hyperglycemia; Hypertension; and Pulmonary fibrosis ([place]).   reports that he quit smoking about 15 years ago. He has a 80.00 pack-year smoking history. He has never used smokeless tobacco.    OV 07/21/2016  Chief Complaint  Patient presents with  . Follow-up    Pt seen 3 weeks ago. Pt SOB has worsened, and tightest in chest. Pt is coughing up yellow thick mucus most times.     71 year old male follow-up acute visit in the setting of idiopathic pulmonary fibrosis. Diagnoses made at last visit. This visit is because of persistent severe class III-IV dyspnea. The family is asking for oxygen all the time but so far he is not technically qualified and given cultural and language barriers then not understanding Medicare regulations. This generated  multiple phone calls we called them in. However at rest is pulse ox is 88%. The patient is here with this son who I am meeting for the first time and her daughter-in-law who can speak [place]. The daughter-in-law is not married to this  son. Overall in hearing that he has progressed. They did visit with cardiology and is on calcium channel blocker but there is still significant dyspnea. They've questions about transplant about starting Pirfenidone ([name]) which is arrived but he has not started it. Asking about stem cell therapy. The particularly seem interested in lung transplant although his immigration status and Spanish language barrier could be barriers. He is also not started pulmonary rehabilitation. 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   reports that he quit smoking about 15 years ago. He has a 80.00 pack-year smoking history. He has never used smokeless tobacco.  Past Surgical History:  Procedure Laterality Date  . EYE SURGERY    . HERNIA REPAIR    . TRANSTHORACIC ECHOCARDIOGRAM  05/15/2016   EF 60 -65%. GR 1 DD. Moderate PA pressures estimated at 55 mmHg    Allergies  Allergen Reactions  . Cyclobenzaprine Swelling    Tongue and lips swell    Immunization History  Administered Date(s) Administered  . Influenza Split 01/24/2016  . Pneumococcal Conjugate-13 06/20/2016  . Pneumococcal Polysaccharide-23 11/05/2012    Family History  Problem Relation Age of Onset  . Heart attack Mother      Current Outpatient Prescriptions:  .  albuterol (PROVENTIL HFA;VENTOLIN HFA) 108 (90 Base) MCG/ACT inhaler, Inhale 1-2 puffs into the lungs every 4 (four) hours as needed for wheezing or shortness of breath.,  Disp: 1 Inhaler, Rfl: 2 .  albuterol (PROVENTIL) (2.5 MG/3ML) 0.083% nebulizer solution, Take 3 mLs (2.5 mg total) by nebulization every 4 (four) hours as needed for wheezing or shortness of breath., Disp: 75 mL, Rfl: 0 .  calcium-vitamin D (OSCAL WITH D) 500-200 MG-UNIT tablet, Take 1 tablet by mouth 2 (two) times daily., Disp: 60 tablet, Rfl: 3 .  diltiazem (CARDIZEM CD) 120 MG 24 hr capsule, Take 1 capsule (120 mg total) by mouth daily., Disp: 30 capsule, Rfl: 6 .  Glycopyrrolate-Formoterol (BEVESPI AEROSPHERE) 9-4.8 MCG/ACT AERO, Inhale 2 puffs into the lungs 2 (two) times daily. , Disp: , Rfl:  .  omeprazole (PRILOSEC) 40 MG capsule, Take 1 capsule (40 mg total) by mouth daily., Disp: 30 capsule, Rfl: 6 .  predniSONE (DELTASONE) 10 MG tablet, Take 2 tabs daily (Patient taking differently: Take 20 mg by mouth daily. ), Disp: 60 tablet, Rfl: 1 .  acetaminophen (TYLENOL) 325 MG tablet, Take 325 mg by mouth every 6 (six) hours as needed (for pain)., Disp: , Rfl:    Review of Systems     Objective:   Physical Exam Vitals:   07/21/16 1011 07/21/16 1012  BP: 102/70   Pulse: (!) 110   SpO2: 93% (!) 88%  Weight: 117 lb 3.2 oz (53.2 kg)   Height: _0  (1.626 m)    Lean body mass Sitting in the chair Looks stable but it is obvious he has mild shallow respiration Alert and oriented 3 Spanish only speaking Crackles on exam Mildy tachycardic No edema     Assessment:       ICD-10-CM   1. IPF (idiopathic pulmonary fibrosis) ([place]) J84.112   2. Chronic respiratory failure with hypoxia/ noct desats J96.11        Plan:     . I think your disease has progressed  Plan  - START [name]  -  - start at 1 pill thrree times daily with food  - one week later go to 2 pills three times daily - this is your MAX Dose   - do not go to 3 pills three times daily   - went over side effects again  - start continous o2 - keep pulse ox > 87% - you can buy pusle ox machine  - get portable o2  -  do not recommend stem cell treatment   - refer [facility] at your request      - will disucss support group later  Followup 4 weeks to see me  or APP for esbriet monitoring   > 50% of this > 25 min visit spent in face to face counseling or coordination of care    ,Dr. Brand Males, M.D., [facility].C.P Pulmonary and Critical Care Medicine Staff Physician [place] [facility] Pager: [phone], If no answer or between  15:00h - 7:00h: call [phone]  07/21/2016 10:45 AM

## 2016-07-21 NOTE — Patient Instructions (Addendum)
ICD-10-CM   1. IPF (idiopathic pulmonary fibrosis) ([place]) J84.112   2. Chronic respiratory failure with hypoxia/ noct desats J96.11    I think your disease has progressed  Plan  - START [name]  -  - start at 1 pill thrree times daily with food  - one week later go to 2 pills three times daily - this is your MAX Dose   - do not go to 3 pills three times daily   - went over side effects again  - start continous o2 - keep pulse ox > 87% - you can buy pusle ox machine  - get portable o2  - do not recommend stem cell treatment   - refer [facility] at your request      - will disucss support group later  Followup 4 weeks to see me or APP for esbriet monitoring

## 2016-07-21 NOTE — Telephone Encounter (Signed)
lmtcb x1 for pt's son.

## 2016-07-24 MED FILL — ?PREDNISONE 10 MG TABLET: 10 | 30 days supply | Qty: 60 | Fill #1

## 2016-07-24 NOTE — Telephone Encounter (Signed)
Called and spoke with [name] and he stated that for the last week his dad has been using the oxygen 24/7.  He is aware of appt with TP on Friday.  Nothing further is needed.

## 2016-07-28 ENCOUNTER — Ambulatory Visit: Payer: Self-pay | Admitting: Adult Health

## 2016-08-01 ENCOUNTER — Encounter (HOSPITAL_COMMUNITY): Payer: Self-pay | Admitting: Vascular Surgery

## 2016-08-01 ENCOUNTER — Inpatient Hospital Stay (HOSPITAL_COMMUNITY)
Admission: EM | Admit: 2016-08-01 | Discharge: 2016-08-08 | DRG: 196 | Disposition: A | Discharge status: Home / Self Care | Payer: Self-pay | Attending: Pulmonary Disease | Admitting: Pulmonary Disease

## 2016-08-01 ENCOUNTER — Emergency Department (HOSPITAL_COMMUNITY): Payer: Self-pay

## 2016-08-01 DIAGNOSIS — Z7952 Long term (current) use of systemic steroids: Secondary | ICD-10-CM

## 2016-08-01 DIAGNOSIS — J8410 Pulmonary fibrosis, unspecified: Secondary | ICD-10-CM | POA: Diagnosis present

## 2016-08-01 DIAGNOSIS — I272 Pulmonary hypertension, unspecified: Secondary | ICD-10-CM | POA: Diagnosis present

## 2016-08-01 DIAGNOSIS — R509 Fever, unspecified: Secondary | ICD-10-CM

## 2016-08-01 DIAGNOSIS — Z6820 Body mass index (BMI) 20.0-20.9, adult: Secondary | ICD-10-CM

## 2016-08-01 DIAGNOSIS — Z9981 Dependence on supplemental oxygen: Secondary | ICD-10-CM

## 2016-08-01 DIAGNOSIS — J670 Farmer's lung: Principal | ICD-10-CM | POA: Diagnosis present

## 2016-08-01 DIAGNOSIS — Y95 Nosocomial condition: Secondary | ICD-10-CM | POA: Diagnosis present

## 2016-08-01 DIAGNOSIS — K219 Gastro-esophageal reflux disease without esophagitis: Secondary | ICD-10-CM | POA: Diagnosis present

## 2016-08-01 DIAGNOSIS — I10 Essential (primary) hypertension: Secondary | ICD-10-CM | POA: Diagnosis present

## 2016-08-01 DIAGNOSIS — J9621 Acute and chronic respiratory failure with hypoxia: Secondary | ICD-10-CM | POA: Diagnosis present

## 2016-08-01 DIAGNOSIS — J84112 Idiopathic pulmonary fibrosis: Secondary | ICD-10-CM | POA: Diagnosis present

## 2016-08-01 DIAGNOSIS — E440 Moderate protein-calorie malnutrition: Secondary | ICD-10-CM | POA: Insufficient documentation

## 2016-08-01 DIAGNOSIS — R739 Hyperglycemia, unspecified: Secondary | ICD-10-CM | POA: Diagnosis present

## 2016-08-01 DIAGNOSIS — Z888 Allergy status to other drugs, medicaments and biological substances status: Secondary | ICD-10-CM

## 2016-08-01 DIAGNOSIS — Z79899 Other long term (current) drug therapy: Secondary | ICD-10-CM

## 2016-08-01 DIAGNOSIS — Z87891 Personal history of nicotine dependence: Secondary | ICD-10-CM

## 2016-08-01 DIAGNOSIS — J849 Interstitial pulmonary disease, unspecified: Secondary | ICD-10-CM | POA: Diagnosis present

## 2016-08-01 DIAGNOSIS — R64 Cachexia: Secondary | ICD-10-CM | POA: Diagnosis present

## 2016-08-01 DIAGNOSIS — J9620 Acute and chronic respiratory failure, unspecified whether with hypoxia or hypercapnia: Secondary | ICD-10-CM | POA: Diagnosis present

## 2016-08-01 LAB — CBC WITH DIFFERENTIAL/PLATELET
Basophils Absolute: 0.1 10*3/uL (ref 0.0–0.1)
Basophils Relative: 0 %
Eosinophils Absolute: 0.3 10*3/uL (ref 0.0–0.7)
Eosinophils Relative: 1 %
HCT: 47.2 % (ref 39.0–52.0)
Hemoglobin: 15.1 g/dL (ref 13.0–17.0)
Lymphocytes Relative: 16 %
Lymphs Abs: 3.1 10*3/uL (ref 0.7–4.0)
MCH: 30 pg (ref 26.0–34.0)
MCHC: 32 g/dL (ref 30.0–36.0)
MCV: 93.7 fL (ref 78.0–100.0)
Monocytes Absolute: 1 10*3/uL (ref 0.1–1.0)
Monocytes Relative: 5 %
Neutro Abs: 14.7 10*3/uL — ABNORMAL HIGH (ref 1.7–7.7)
Neutrophils Relative %: 78 %
Platelets: 313 10*3/uL (ref 150–400)
RBC: 5.04 MIL/uL (ref 4.22–5.81)
RDW: 14.5 % (ref 11.5–15.5)
WBC: 19.1 10*3/uL — ABNORMAL HIGH (ref 4.0–10.5)

## 2016-08-01 LAB — URINALYSIS, ROUTINE W REFLEX MICROSCOPIC
Bilirubin Urine: NEGATIVE
Glucose, UA: NEGATIVE mg/dL
Hgb urine dipstick: NEGATIVE
Ketones, ur: NEGATIVE mg/dL
Leukocytes, UA: NEGATIVE
Nitrite: NEGATIVE
Protein, ur: NEGATIVE mg/dL
Specific Gravity, Urine: 1.014 (ref 1.005–1.030)
pH: 8 (ref 5.0–8.0)

## 2016-08-01 LAB — COMPREHENSIVE METABOLIC PANEL
ALT: 26 U/L (ref 17–63)
AST: 24 U/L (ref 15–41)
Albumin: 3.2 g/dL — ABNORMAL LOW (ref 3.5–5.0)
Alkaline Phosphatase: 52 U/L (ref 38–126)
Anion gap: 8 (ref 5–15)
BUN: 15 mg/dL (ref 6–20)
CO2: 32 mmol/L (ref 22–32)
Calcium: 9 mg/dL (ref 8.9–10.3)
Chloride: 96 mmol/L — ABNORMAL LOW (ref 101–111)
Creatinine, Ser: 0.96 mg/dL (ref 0.61–1.24)
GFR calc Af Amer: >60 mL/min (ref >60)
GFR calc non Af Amer: >60 mL/min (ref >60)
Glucose, Bld: 125 mg/dL — ABNORMAL HIGH (ref 65–99)
Potassium: 3.7 mmol/L (ref 3.5–5.1)
Sodium: 136 mmol/L (ref 135–145)
Total Bilirubin: 0.7 mg/dL (ref 0.3–1.2)
Total Protein: 7.1 g/dL (ref 6.5–8.1)

## 2016-08-01 LAB — PROTIME-INR
INR: 1.07
Prothrombin Time: 13.9 seconds (ref 11.4–15.2)

## 2016-08-01 LAB — I-STAT CG4 LACTIC ACID, ED: Lactic Acid, Venous: 1.47 mmol/L (ref 0.5–1.9)

## 2016-08-01 MED ORDER — SODIUM CHLORIDE 0.9 % IV SOLN
INTRAVENOUS | Status: DC
  Administered 2016-08-01 – 2016-08-02 (×2): via INTRAVENOUS

## 2016-08-01 MED ORDER — SODIUM CHLORIDE 0.9 % IV SOLN
INTRAVENOUS | Status: DC
  Administered 2016-08-01: 11:00:00 via INTRAVENOUS

## 2016-08-01 MED ORDER — METHYLPREDNISOLONE SODIUM SUCC 125 MG IJ SOLR
125.0000 mg | Freq: Once | INTRAMUSCULAR | Status: AC
  Administered 2016-08-01: 125 mg via INTRAVENOUS
  Filled 2016-08-01: qty 2

## 2016-08-01 MED ORDER — ALBUTEROL SULFATE (2.5 MG/3ML) 0.083% IN NEBU: 2.5000 mg | INHALATION_SOLUTION | RESPIRATORY_TRACT | Status: DC | PRN

## 2016-08-01 MED ORDER — LEVALBUTEROL HCL 0.63 MG/3ML IN NEBU: 0.6300 mg | INHALATION_SOLUTION | RESPIRATORY_TRACT | Status: DC | PRN

## 2016-08-01 MED ORDER — VANCOMYCIN HCL 10 G IV SOLR
1250.0000 mg | Freq: Once | INTRAVENOUS | Status: AC
  Administered 2016-08-01: 1250 mg via INTRAVENOUS
  Filled 2016-08-01: qty 1250

## 2016-08-01 MED ORDER — VANCOMYCIN HCL 500 MG IV SOLR
500.0000 mg | Freq: Two times a day (BID) | INTRAVENOUS | Status: DC
  Administered 2016-08-02 – 2016-08-03 (×4): 500 mg via INTRAVENOUS
  Filled 2016-08-01 (×4): qty 500

## 2016-08-01 MED ORDER — SODIUM CHLORIDE 0.9% FLUSH
3.0000 mL | Freq: Two times a day (BID) | INTRAVENOUS | Status: DC
  Administered 2016-08-01 – 2016-08-07 (×9): 3 mL via INTRAVENOUS

## 2016-08-01 MED ORDER — DEXTROSE 5 % IV SOLN
1.0000 g | Freq: Three times a day (TID) | INTRAVENOUS | Status: DC
Start: 2016-08-02 — End: 2016-08-08
  Administered 2016-08-02 – 2016-08-08 (×19): 1 g via INTRAVENOUS
  Filled 2016-08-01 (×20): qty 1

## 2016-08-01 MED ORDER — LEVOFLOXACIN IN D5W 750 MG/150ML IV SOLN
750.0000 mg | Freq: Once | INTRAVENOUS | Status: AC
  Administered 2016-08-01: 750 mg via INTRAVENOUS
  Filled 2016-08-01: qty 150

## 2016-08-01 MED ORDER — PIRFENIDONE 267 MG PO CAPS
534.0000 mg | ORAL_CAPSULE | Freq: Three times a day (TID) | ORAL | Status: DC
  Administered 2016-08-01 – 2016-08-02 (×2): 534 mg via ORAL
  Administered 2016-08-02: 2 via ORAL
  Administered 2016-08-02 – 2016-08-07 (×15): 534 mg via ORAL
  Administered 2016-08-07: 09:00:00 via ORAL
  Administered 2016-08-08 (×2): 534 mg via ORAL
  Filled 2016-08-01 (×16): qty 2

## 2016-08-01 MED ORDER — IPRATROPIUM-ALBUTEROL 0.5-2.5 (3) MG/3ML IN SOLN
3.0000 mL | Freq: Once | RESPIRATORY_TRACT | Status: AC
  Administered 2016-08-01: 3 mL via RESPIRATORY_TRACT
  Filled 2016-08-01: qty 3

## 2016-08-01 MED ORDER — DILTIAZEM HCL ER COATED BEADS 120 MG PO CP24
120.0000 mg | ORAL_CAPSULE | Freq: Every day | ORAL | Status: DC
  Administered 2016-08-01 – 2016-08-08 (×8): 120 mg via ORAL
  Filled 2016-08-01 (×8): qty 1

## 2016-08-01 MED ORDER — HEPARIN SODIUM (PORCINE) 5000 UNIT/ML IJ SOLN
5000.0000 [IU] | Freq: Three times a day (TID) | INTRAMUSCULAR | Status: DC
  Administered 2016-08-01 – 2016-08-08 (×21): 5000 [IU] via SUBCUTANEOUS
  Filled 2016-08-01 (×21): qty 1

## 2016-08-01 MED ORDER — PANTOPRAZOLE SODIUM 40 MG PO TBEC
40.0000 mg | DELAYED_RELEASE_TABLET | Freq: Every day | ORAL | Status: DC
  Administered 2016-08-01 – 2016-08-08 (×8): 40 mg via ORAL
  Filled 2016-08-01 (×8): qty 1

## 2016-08-01 MED ORDER — METHYLPREDNISOLONE SODIUM SUCC 40 MG IJ SOLR
40.0000 mg | Freq: Two times a day (BID) | INTRAMUSCULAR | Status: DC
  Administered 2016-08-01 – 2016-08-02 (×2): 40 mg via INTRAVENOUS
  Filled 2016-08-01 (×2): qty 1

## 2016-08-01 NOTE — ED Triage Notes (Signed)
Pt reports to the ED for eval of SOB, sensation of fevers, and productive yellow cough. Symptoms have been ongoing x 1 week. Pt wears 4 L of O2 chronically for pulmonary fibrosis. Pt is tachypnic and tachycardic in the 130s.

## 2016-08-01 NOTE — ED Notes (Signed)
Critical Care at bedside. 

## 2016-08-01 NOTE — Progress Notes (Signed)
Pharmacy Antibiotic Note  [name] is a 71 y.o. male admitted on 08/01/2016 with immunocompromised pneumonia .  Pharmacy has been consulted for cefepime and vancomycin dosing. Patient has history of pulmonary fibrosis. Patient given levaquin 757m IV x 1 in ED. Using CrCl of 54.522mmin. WBC count 19.1, afebrile.   Plan: Vancomycin LD 1250 mg IV x 1  Vancomycin 500 mg  IV every 12 hours.  Goal trough 15-20 mcg/mL.  Cefepime 1 gram IV q8hr Monitor c/s, clinical progress, renal function F/u de-escalation plan/ LOT, vanc through as indicated    Height: 5' 4" (162.6 cm) Weight: 117 lb (53.1 kg) IBW/kg (Calculated) : 59.2  Temp (24hrs), Avg:97.5 F (36.4 C), Min:97.5 F (36.4 C), Max:97.5 F (36.4 C)   Recent Labs Lab 08/01/16 0956 08/01/16 1011  WBC 19.1*  --   CREATININE 0.96  --   LATICACIDVEN  --  1.47    Estimated Creatinine Clearance: 54.5 mL/min (by C-G formula based on SCr of 0.96 mg/dL).    Allergies  Allergen Reactions  . Cyclobenzaprine Swelling and Other (See Comments)    Tongue and lips swell    Antimicrobials this admission: 7/10 cefepime >> 7/10 vancomycin >>  Dose adjustments this admission:   Microbiology results: 7/10 BCx: pending   Thank you for allowing pharmacy to be a part of this patient's care.  ShJalene MulletPharm.D. PGY1 Pharmacy Resident 08/01/2016 12:10 PM Main Pharmacy: [phone]

## 2016-08-01 NOTE — H&P (Signed)
Name: [name] MRN: [id number] DOB: April 27, 1945    ADMISSION DATE:  08/01/2016 CONSULTATION DATE:  7/10  REFERRING MD :  [name]  CHIEF COMPLAINT:  Dyspnea  HISTORY OF PRESENT ILLNESS:  Pt is spanish speaking; therefore, this HPI is obtained via translation from grandchild (persmission granted by patient). 71 year old male with PMH as below, which is significant for IPF followed by Dr. Chase Cervantes (hypersensitivity panel positive for [name] - c/w [name]), HTN, and GERD. He was last seen by Dr. Chase Cervantes 06/27/2016.  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 He is chronically on 4L O2 along with daily prednisone.  He was also recently started on [name] and was being referred to [facility] for further evaluation by their transplant team.  He was brought to [name] ED 7/10 with worsening SOB x 1 week.  He had associated cough productive of yellow sputum along with pleuritic chest pain.  Denies N/V/D, abd pain, myalgias, exposures to known sick contacts, recent travel.  CXR was c/w chronic IPF changes but no clear infiltrate; though history concerning for bronchitis vs PNA.  He was subsequently admitted for further management.  SIGNIFICANT EVENTS  7/10 > admit.  STUDIES:  CXR 7/10 > chronic scarring.   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  has a past surgical history that includes Hernia repair; Eye surgery; and transthoracic echocardiogram (05/15/2016). Prior to Admission medications   Medication Sig Start Date End Date Taking? Authorizing Provider  acetaminophen (TYLENOL) 325 MG tablet Take 325 mg by  mouth every 6 (six) hours as needed (for pain).   Yes [provider]  albuterol (PROVENTIL HFA;VENTOLIN HFA) 108 (90 Base) MCG/ACT inhaler Inhale 1-2 puffs into the lungs every 4 (four) hours as needed for wheezing or shortness of breath. 07/15/16  Yes Daniel Decamp, PA-C  albuterol (PROVENTIL) (2.5 MG/3ML) 0.083% nebulizer solution Take 3 mLs (2.5 mg total) by nebulization every 4 (four) hours as needed for wheezing or shortness of breath. 07/15/16  Yes Daniel Decamp, PA-C  calcium-vitamin D (OSCAL WITH D) 500-200 MG-UNIT tablet Take 1 tablet by mouth 2 (two) times daily. 06/20/16  Yes Daniel Pier, MD  diltiazem (CARDIZEM CD) 120 MG 24 hr capsule Take 1 capsule (120 mg total) by mouth daily. 07/13/16 10/11/16 Yes Daniel Man, MD  Glycopyrrolate-Formoterol (BEVESPI AEROSPHERE) 9-4.8 MCG/ACT AERO Inhale 2 puffs into the lungs 2 (two) times daily. 07/21/16  Yes Daniel Males, MD  omeprazole (PRILOSEC) 40 MG capsule Take 1 capsule (40 mg total) by mouth daily. 06/20/16 06/20/17 Yes Daniel Pier, MD  Pirfenidone ([name]) 267 MG CAPS Take 534 mg by mouth 3 (three) times daily.   Yes [provider]  predniSONE (DELTASONE) 10 MG tablet Take 2 tabs daily Patient taking differently: Take 20 mg by mouth daily.  06/27/16  Yes Daniel Males, MD   Allergies  Allergen Reactions  . Cyclobenzaprine Swelling and Other (See Comments)    Tongue and lips swell    FAMILY HISTORY:  family history includes Heart attack in his mother. SOCIAL HISTORY:  reports that he quit smoking about 15 years ago. He has a 80.00 pack-year smoking history. He has never  used smokeless tobacco. He reports that he does not drink alcohol or use drugs.  REVIEW OF SYSTEMS:   All negative; except for those that are bolded, which indicate positives.  Constitutional: weight loss, weight gain, night sweats, fevers, chills, fatigue, weakness.  HEENT: headaches, sore throat, sneezing, nasal congestion, post  nasal drip, difficulty swallowing, tooth/dental problems, visual complaints, visual changes, ear aches. Neuro: difficulty with speech, weakness, numbness, ataxia. CV:  chest tightness, orthopnea, PND, swelling in lower extremities, dizziness, palpitations, syncope.  Resp: cough, hemoptysis, dyspnea, wheezing. GI: heartburn, indigestion, abdominal pain, nausea, vomiting, diarrhea, constipation, change in bowel habits, loss of appetite, hematemesis, melena, hematochezia.  GU: dysuria, change in color of urine, urgency or frequency, flank pain, hematuria. MSK: joint pain or swelling, decreased range of motion. Psych: change in mood or affect, depression, anxiety, suicidal ideations, homicidal ideations. Skin: rash, itching, bruising.   SUBJECTIVE: Denies SOB at rest.  Currently on 4L O2 and sats 95%.    VITAL SIGNS: Temp:  [97.5 F (36.4 C)] 97.5 F (36.4 C) (07/10 0944) Pulse Rate:  [113-147] 113 (07/10 1045) Resp:  [22-46] 44 (07/10 1045) BP: (112-131)/(76-81) 112/76 (07/10 1045) SpO2:  [88 %-95 %] 95 % (07/10 1045) Weight:  [53.1 kg (117 lb)] 53.1 kg (117 lb) (07/10 0946)  PHYSICAL EXAMINATION: General: Hispanic male, resting in bed, in NAD.   Neuro: A&O x 3, non-focal.  HEENT: [place]/AT. PERRL, sclerae anicteric. Cardiovascular: RRR, no M/R/G.  Lungs: Respirations even and unlabored.  Coarse crackles bilaterally. Abdomen: BS x 4, soft, NT/ND.  Musculoskeletal: No gross deformities, no edema.  Skin: Intact, warm, no rashes.     Recent Labs Lab 08/01/16 0956  NA 136  K 3.7  CL 96*  CO2 32  BUN 15  CREATININE 0.96  GLUCOSE 125*    Recent Labs Lab 08/01/16 0956  HGB 15.1  HCT 47.2  WBC 19.1*  PLT 313   Dg Chest Portable 1 View  Result Date: 08/01/2016 CLINICAL DATA:  Shortness of breath, productive cough and fever. History of pulmonary fibrosis. Interstitial Cervantes disease. EXAM: PORTABLE CHEST 1 VIEW COMPARISON:  07/15/2016, 06/07/2016, 05/30/2016 and 05/03/2016  FINDINGS: The patient has severe chronic interstitial Cervantes disease with multiple chronic areas of increased density in both lungs, and systole unchanged since multiple prior studies. Heart size and pulmonary vascularity are normal. No acute bone abnormality. IMPRESSION: No acute abnormalities.  Severe chronic Cervantes disease. Electronically Signed   By: Lorriane Shire M.D.   On: 08/01/2016 10:32    ASSESSMENT / PLAN:  IPF due to [name] - with concern for acute flare.  Has been on daily pred, O2, and recently started on [name].  Also recently referred to [facility] for transplant eval. AoC hypoxic respiratory failure - due to above. Concern for bronchitis vs CAP. Plan: Solumedrol 61m BID. Cefepime and vanc for CAP in immunocompromised host Continue preadmission [name]. Levalbuterol PRN.  Hx HTN. Plan: Continue preadmission Cardizem.  GERD. Plan: Continue preadmission PPI.   Best Practice: Code Status:  Full. Diet: Regular. DVT prophylaxis:  SCD's / Heparin. Dispo: Tele.   RMontey Hora PWigginsPulmonary & Critical Care Medicine Pager: [phone] or ([phone]/10/2016, 11:59 AM

## 2016-08-01 NOTE — ED Provider Notes (Signed)
[facility] DEPT Provider Note   CSN: [id number] Arrival date & time: 08/01/16  8185   By signing my name below, I, Evelene Croon, attest that this documentation has been prepared under the direction and in the presence of Virgel Manifold, MD . Electronically Signed: Evelene Croon, Scribe. 08/01/2016. 10:20 AM.   History   Chief Complaint Chief Complaint  Patient presents with  . Shortness of Breath     The history is provided by the patient and a relative. No language interpreter was used.     HPI Comments:  [name] is a 71 y.o. male with a history of HTN, idiopathic pulmonary fibrosis, and interstitial lung disease, who presents to the Emergency Department complaining of increased trouble breathing x 1 week. Family notes he has seemed more short of breath despite being on home oxygen 4 liters. Pt reports associated central CP and cough productive of yellow sputum as well as subjective fever x 1 week. No acute leg swelling. No alleviating factors noted. Pt is not a native [place] speaker; history translated/provided by family at bedside.  Past Medical History:  Diagnosis Date  . GERD (gastroesophageal reflux disease)   . Hyperglycemia   . Hypertension   . IPF (idiopathic pulmonary fibrosis) ([place]) 06/27/2016  . Pulmonary fibrosis ([place])   . 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 Chronic respiratory failure with hypoxia/ noct desats 07/14/2016  . 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 IPF (idiopathic pulmonary fibrosis) ([place]) 06/27/2016  . 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 Acute respiratory distress 06/07/2016  . HCAP (healthcare-associated pneumonia) 06/07/2016  . Sepsis ([place]) 06/07/2016  . HTN (hypertension) 06/07/2016  . Anemia   . Hyperglycemia   . Chest pain on breathing   . Pneumomediastinum ([place])   . Protein-calorie  malnutrition, severe 05/15/2016  . Pneumonitis   . Interstitial lung disease ([place]) 05/13/2016  . ILD (interstitial lung disease) ([place]) 05/12/2016  . Weight loss, abnormal 05/12/2016  . Chest pain 05/12/2016  . Sinus tachycardia 05/12/2016  . Dyspnea and respiratory abnormalities 05/12/2016    Past Surgical History:  Procedure Laterality Date  . EYE SURGERY    . HERNIA REPAIR    . TRANSTHORACIC ECHOCARDIOGRAM  05/15/2016   EF 60 -65%. GR 1 DD. Moderate PA pressures estimated at 55 mmHg       Home Medications    Prior to Admission medications   Medication Sig Start Date End Date Taking? Authorizing Provider  acetaminophen (TYLENOL) 325 MG tablet Take 325 mg by mouth every 6 (six) hours as needed (for pain).    [provider]  albuterol (PROVENTIL HFA;VENTOLIN HFA) 108 (90 Base) MCG/ACT inhaler Inhale 1-2 puffs into the lungs every 4 (four) hours as needed for wheezing or shortness of breath. 07/15/16   Shary Decamp, PA-C  albuterol (PROVENTIL) (2.5 MG/3ML) 0.083% nebulizer solution Take 3 mLs (2.5 mg total) by nebulization every 4 (four) hours as needed for wheezing or shortness of breath. 07/15/16   Shary Decamp, PA-C  calcium-vitamin D (OSCAL WITH D) 500-200 MG-UNIT tablet Take 1 tablet by mouth 2 (two) times daily. 06/20/16   Ladell Pier, MD  diltiazem (CARDIZEM CD) 120 MG 24 hr capsule Take 1 capsule (120 mg total) by mouth daily. 07/13/16 10/11/16  Leonie Man, MD  Glycopyrrolate-Formoterol (BEVESPI AEROSPHERE) 9-4.8 MCG/ACT AERO Inhale 2 puffs into the lungs 2 (two) times daily.  07/21/16   Brand Males, MD  omeprazole (PRILOSEC) 40 MG capsule Take 1 capsule (40 mg total) by mouth daily. 06/20/16 06/20/17  Ladell Pier, MD  predniSONE (DELTASONE) 10 MG tablet Take 2 tabs daily Patient taking differently: Take 20 mg by mouth daily.  06/27/16   Brand Males, MD    Family History Family History  Problem Relation Age of Onset  . Heart attack Mother      Social History Social History  Substance Use Topics  . Smoking status: Former Smoker    Packs/day: 2.00    Years: 40.00    Quit date: 05/03/2001  . Smokeless tobacco: Never Used  . Alcohol use No     Allergies   Cyclobenzaprine   Review of Systems Review of Systems  Constitutional: Positive for fever (subjective).  Respiratory: Positive for cough and shortness of breath.   Cardiovascular: Positive for chest pain. Negative for leg swelling.  All other systems reviewed and are negative.    Physical Exam Updated Vital Signs BP 131/80 (BP Location: Left Arm)   Pulse (!) 147   Temp (!) 97.5 F (36.4 C) (Oral)   Resp (!) 22   Ht _0  (1.626 m)   Wt 117 lb (53.1 kg)   SpO2 (!) 88%   BMI 20.08 kg/m   Physical Exam  Constitutional: He is oriented to person, place, and time. He appears well-developed. He appears cachectic.  HENT:  Head: Normocephalic and atraumatic.  Eyes: EOM are normal.  Neck: Normal range of motion.  Cardiovascular: Regular rhythm, normal heart sounds and intact distal pulses.  Tachycardia present.   Pulmonary/Chest: Breath sounds normal. Tachypnea noted. No respiratory distress.  Respiratory rate ~ 35 Course breath sounds bilaterally; slighty decreased on the right as compared to the left    Abdominal: Soft. He exhibits no distension. There is no tenderness.  Musculoskeletal: Normal range of motion. He exhibits no edema.  No lower extremity edema   Neurological: He is alert and oriented to person, place, and time.  Skin: Skin is warm and dry.  Psychiatric: He has a normal mood and affect.  Nursing note and vitals reviewed.    ED Treatments / Results  DIAGNOSTIC STUDIES:  Oxygen Saturation is 93% on [place], adequate by my interpretation.    COORDINATION OF CARE:  10:14 AM Discussed treatment plan with pt and family at bedside and they agreed to plan.  Labs (all labs ordered are listed, but only abnormal results are displayed) Labs  Reviewed  COMPREHENSIVE METABOLIC PANEL - Abnormal; Notable for the following:       Result Value   [place] 96 (*)    Glucose, Bld 125 (*)    Albumin 3.2 (*)    All other components within normal limits  CBC WITH DIFFERENTIAL/PLATELET - Abnormal; Notable for the following:    WBC 19.1 (*)    Neutro Abs 14.7 (*)    All other components within normal limits  CULTURE, BLOOD (ROUTINE X 2)  CULTURE, BLOOD (ROUTINE X 2)  PROTIME-INR  URINALYSIS, ROUTINE W REFLEX MICROSCOPIC  I-STAT CG4 LACTIC ACID, ED    EKG  EKG Interpretation  Date/Time:  Tuesday August 01 2016 09:54:37 EDT Ventricular Rate:  143 PR Interval:  120 QRS Duration: 74 QT Interval:  270 QTC Calculation: 416 R Axis:   69 Text Interpretation:  Sinus tachycardia with occasional Premature ventricular complexes Possible Left atrial enlargement Left ventricular hypertrophy Nonspecific ST and T wave abnormality Abnormal ECG Confirmed by Wilson Singer,  Annie Main [phone]) on 08/01/2016 10:09:58 AM       Radiology No results found.   Dg Chest Portable 1 View  Result Date: 08/01/2016 CLINICAL DATA:  Shortness of breath, productive cough and fever. History of pulmonary fibrosis. Interstitial lung disease. EXAM: PORTABLE CHEST 1 VIEW COMPARISON:  07/15/2016, 06/07/2016, 05/30/2016 and 05/03/2016 FINDINGS: The patient has severe chronic interstitial lung disease with multiple chronic areas of increased density in both lungs, and systole unchanged since multiple prior studies. Heart size and pulmonary vascularity are normal. No acute bone abnormality. IMPRESSION: No acute abnormalities.  Severe chronic lung disease. Electronically Signed   By: Lorriane Shire M.D.   On: 08/01/2016 10:32   Procedures Procedures (including critical care time)  Medications Ordered in ED Medications - No data to display   Initial Impression / Assessment and Plan / ED Course  I have reviewed the triage vital signs and the nursing notes.  Pertinent labs &  imaging results that were available during my care of the patient were reviewed by me and considered in my medical decision making (see chart for details).     70yM with respiratory distress. Hx of ILD/Farmer's Lung. On 2L o2 normally. Has increased o2 requirement and on 5L currently to keep sat around 90%. Reports change in character of cough, now sometimes productive and also CP/back pain when coughing. Will try nebs and steroids although I'm not sure how much acute benefit he may get from this.  He has a leukocytosis but is chronically on steroids. He is afebrile.CXR w/o acute appearing process but he reports change in character of cough and abx ordered. I'm not sure how much more I can offer him the ED. I think this is progression of his underlying lung disease. I doubt cardiac, PE, etc. Will discuss with CCM/pulm.   Final Clinical Impressions(s) / ED Diagnoses   Final diagnoses:  Acute on chronic respiratory failure with hypoxia (HCC)    New Prescriptions New Prescriptions   No medications on file   I personally preformed the services scribed in my presence. The recorded information has been reviewed is accurate. Virgel Manifold, MD.     Virgel Manifold, MD 08/03/16 [phone]

## 2016-08-02 DIAGNOSIS — J189 Pneumonia, unspecified organism: Secondary | ICD-10-CM

## 2016-08-02 LAB — BLOOD CULTURE ID PANEL (REFLEXED)
Acinetobacter baumannii: NOT DETECTED
Candida albicans: NOT DETECTED
Candida glabrata: NOT DETECTED
Candida krusei: NOT DETECTED
Candida parapsilosis: NOT DETECTED
Candida tropicalis: NOT DETECTED
ENTEROCOCCUS SPECIES: NOT DETECTED
ESCHERICHIA COLI: NOT DETECTED
Enterobacter cloacae complex: NOT DETECTED
Enterobacteriaceae species: NOT DETECTED
HAEMOPHILUS INFLUENZAE: NOT DETECTED
Klebsiella oxytoca: NOT DETECTED
Klebsiella pneumoniae: NOT DETECTED
LISTERIA MONOCYTOGENES: NOT DETECTED
NEISSERIA MENINGITIDIS: NOT DETECTED
PROTEUS SPECIES: NOT DETECTED
PSEUDOMONAS AERUGINOSA: NOT DETECTED
SERRATIA MARCESCENS: NOT DETECTED
STAPHYLOCOCCUS AUREUS BCID: NOT DETECTED
STAPHYLOCOCCUS SPECIES: NOT DETECTED
STREPTOCOCCUS AGALACTIAE: NOT DETECTED
STREPTOCOCCUS PNEUMONIAE: NOT DETECTED
STREPTOCOCCUS PYOGENES: NOT DETECTED
STREPTOCOCCUS SPECIES: DETECTED — AB

## 2016-08-02 LAB — PHOSPHORUS: PHOSPHORUS: 2.5 mg/dL (ref 2.5–4.6)

## 2016-08-02 LAB — BASIC METABOLIC PANEL
Anion gap: 7 (ref 5–15)
BUN: 12 mg/dL (ref 6–20)
CALCIUM: 8.7 mg/dL — AB (ref 8.9–10.3)
CO2: 28 mmol/L (ref 22–32)
Chloride: 101 mmol/L (ref 101–111)
Creatinine, Ser: 0.76 mg/dL (ref 0.61–1.24)
GFR calc Af Amer: >60 mL/min (ref >60)
Glucose, Bld: 176 mg/dL — ABNORMAL HIGH (ref 65–99)
POTASSIUM: 4.2 mmol/L (ref 3.5–5.1)
SODIUM: 136 mmol/L (ref 135–145)

## 2016-08-02 LAB — MAGNESIUM: MAGNESIUM: 1.8 mg/dL (ref 1.7–2.4)

## 2016-08-02 LAB — CBC
HEMATOCRIT: 40.2 % (ref 39.0–52.0)
Hemoglobin: 13 g/dL (ref 13.0–17.0)
MCH: 30 pg (ref 26.0–34.0)
MCHC: 32.3 g/dL (ref 30.0–36.0)
MCV: 92.8 fL (ref 78.0–100.0)
PLATELETS: 292 10*3/uL (ref 150–400)
RBC: 4.33 MIL/uL (ref 4.22–5.81)
RDW: 14.2 % (ref 11.5–15.5)
WBC: 20.9 10*3/uL — AB (ref 4.0–10.5)

## 2016-08-02 MED ORDER — BENZONATATE 100 MG PO CAPS
100.0000 mg | ORAL_CAPSULE | Freq: Three times a day (TID) | ORAL | Status: DC | PRN
  Administered 2016-08-05: 100 mg via ORAL
  Filled 2016-08-02: qty 1

## 2016-08-02 MED ORDER — ENSURE ENLIVE PO LIQD
237.0000 mL | Freq: Two times a day (BID) | ORAL | Status: DC
  Administered 2016-08-02 – 2016-08-08 (×13): 237 mL via ORAL

## 2016-08-02 MED ORDER — METHYLPREDNISOLONE SODIUM SUCC 40 MG IJ SOLR
40.0000 mg | Freq: Every day | INTRAMUSCULAR | Status: DC
  Administered 2016-08-03: 40 mg via INTRAVENOUS
  Filled 2016-08-02: qty 1

## 2016-08-02 NOTE — Progress Notes (Signed)
Name: [name] MRN: [id number] DOB: 07-11-1945    ADMISSION DATE:  08/01/2016 CONSULTATION DATE:  7/10  REFERRING MD :  Dr. Wilson Singer  CHIEF COMPLAINT:  Dyspnea  HISTORY OF PRESENT ILLNESS:  Pt is spanish speaking; therefore, this HPI is obtained via translation from grandchild (persmission granted by patient). 71 year old male with PMH as below, which is significant for IPF followed by Dr. Chase Caller (hypersensitivity panel positive for [name] - c/w Farmers Lung), HTN, and GERD. He was last seen by Dr. Chase Caller 06/27/2016.  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 He is chronically on 4L O2 along with daily prednisone.  He was also recently started on [name] and was being referred to [facility] for further evaluation by their transplant team.  He was brought to [facility] ED 7/10 with worsening SOB x 1 week.  He had associated cough productive of yellow sputum along with pleuritic chest pain.  Denies N/V/D, abd pain, myalgias, exposures to known sick contacts, recent travel.  CXR was c/w chronic IPF changes but no clear infiltrate; though history concerning for bronchitis vs PNA.  He was subsequently admitted for further management.  SUBJECTIVE:  Currently on 4L O2 and sats 95%.  Per phone interpreter, he feels better, denies SOB at rest, only complaint is his cough.  He states it is chronic and unchanged with yellowish tinged sputum.    VITAL SIGNS: Temp:  [97.6 F (36.4 C)-97.8 F (36.6 C)] 97.8 F (36.6 C) (07/11 1014) Pulse Rate:  [94-129] 104 (07/11 1014) Resp:  [16-48] 26 (07/11 1014) BP: (101-129)/(66-87) 127/72 (07/11 1014) SpO2:  [92 %-99 %] 95 % (07/11 1014) Weight:  [121 lb (54.9 kg)] 121 lb (54.9 kg) (07/10 1743)  PHYSICAL EXAMINATION: General:  Hispanic non-english speaking male lying in bed in NAD HEENT: MM pink/moist, sclerae  anicteric, poor dentition Neuro: Alert, appropriate, MAE CV: s1s2 rrr, no m/r/g PULM: even/non-labored, RR 22,  bibasilar dry crackles, otherwise clear  GI: soft, non-tender, bs active  Extremities: warm/dry, no edema  Skin: no rashes or lesions   Recent Labs Lab 08/01/16 0956 08/02/16 0733  NA 136 136  K 3.7 4.2  CL 96* 101  CO2 32 28  BUN 15 12  CREATININE 0.96 0.76  GLUCOSE 125* 176*    Recent Labs Lab 08/01/16 0956 08/02/16 0733  HGB 15.1 13.0  HCT 47.2 40.2  WBC 19.1* 20.9*  PLT 313 292   Dg Chest Portable 1 View  Result Date: 08/01/2016 CLINICAL DATA:  Shortness of breath, productive cough and fever. History of pulmonary fibrosis. Interstitial lung disease. EXAM: PORTABLE CHEST 1 VIEW COMPARISON:  07/15/2016, 06/07/2016, 05/30/2016 and 05/03/2016 FINDINGS: The patient has severe chronic interstitial lung disease with multiple chronic areas of increased density in both lungs, and systole unchanged since multiple prior studies. Heart size and pulmonary vascularity are normal. No acute bone abnormality. IMPRESSION: No acute abnormalities.  Severe chronic lung disease. Electronically Signed   By: Lorriane Shire M.D.   On: 08/01/2016 10:32    SIGNIFICANT EVENTS  7/10 > admit.  STUDIES:  CXR 7/10 > chronic scarring.  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  Has been on daily pred, 4L baseline O2, and recently started on [name].  Also recently referred to [facility] for transplant eval. AoC hypoxic respiratory failure - due to above. Concern for bronchitis vs HCAP. Plan: Wean O2 for sats > 90%, currently at home 4L [place] Solumedrol 33m BID Cefepime and vanc for HCAP in immunocompromised host Continue [name]. Levalbuterol PRN Consider repeat CT chest if symptoms persist Add benzonatate PRN for cough  Leukocytosis - possibly due to chronic steroid use vs infection - BC show 1/2  GPC> strep> pending  - afebrile  Plan: Continue cefepime and vanc, narrow as able Follow cultures  Hx HTN. Plan: Continue Cardizem.  GERD. Plan: Continue PPI.   Best Practice: Code Status:  Full. Diet: Regular. DVT prophylaxis:  SCD's / Heparin. Dispo: Tele.   BKennieth Rad AGACNP-BC [facility] Pgr: [phone] if no answer [phone] 61827/11/2016, 11:10 AM

## 2016-08-02 NOTE — Progress Notes (Signed)
PHARMACY - PHYSICIAN COMMUNICATION CRITICAL VALUE ALERT - BLOOD CULTURE IDENTIFICATION (BCID)  Results for orders placed or performed during the hospital encounter of 08/01/16  Blood Culture ID Panel (Reflexed) (Collected: 08/01/2016 10:20 AM)  Result Value Ref Range   Enterococcus species NOT DETECTED NOT DETECTED   Listeria monocytogenes NOT DETECTED NOT DETECTED   Staphylococcus species NOT DETECTED NOT DETECTED   Staphylococcus aureus NOT DETECTED NOT DETECTED   Streptococcus species DETECTED (A) NOT DETECTED   Streptococcus agalactiae NOT DETECTED NOT DETECTED   Streptococcus pneumoniae NOT DETECTED NOT DETECTED   Streptococcus pyogenes NOT DETECTED NOT DETECTED   Acinetobacter baumannii NOT DETECTED NOT DETECTED   Enterobacteriaceae species NOT DETECTED NOT DETECTED   Enterobacter cloacae complex NOT DETECTED NOT DETECTED   Escherichia coli NOT DETECTED NOT DETECTED   Klebsiella oxytoca NOT DETECTED NOT DETECTED   Klebsiella pneumoniae NOT DETECTED NOT DETECTED   Proteus species NOT DETECTED NOT DETECTED   Serratia marcescens NOT DETECTED NOT DETECTED   Haemophilus influenzae NOT DETECTED NOT DETECTED   Neisseria meningitidis NOT DETECTED NOT DETECTED   Pseudomonas aeruginosa NOT DETECTED NOT DETECTED   Candida albicans NOT DETECTED NOT DETECTED   Candida glabrata NOT DETECTED NOT DETECTED   Candida krusei NOT DETECTED NOT DETECTED   Candida parapsilosis NOT DETECTED NOT DETECTED   Candida tropicalis NOT DETECTED NOT DETECTED    Name of physician (or Provider) Contacted: CCM, Dr. Halford Chessman  Changes to prescribed antibiotics required:  None, covered with Cefepime and vancomycin. Await final speciation.   Vincenza Hews, PharmD, BCPS 08/02/2016, 9:12 AM

## 2016-08-03 LAB — CBC
HCT: 37.9 % — ABNORMAL LOW (ref 39.0–52.0)
Hemoglobin: 12.6 g/dL — ABNORMAL LOW (ref 13.0–17.0)
MCH: 30.5 pg (ref 26.0–34.0)
MCHC: 33.2 g/dL (ref 30.0–36.0)
MCV: 91.8 fL (ref 78.0–100.0)
PLATELETS: 310 10*3/uL (ref 150–400)
RBC: 4.13 MIL/uL — AB (ref 4.22–5.81)
RDW: 14.1 % (ref 11.5–15.5)
WBC: 27.1 10*3/uL — AB (ref 4.0–10.5)

## 2016-08-03 LAB — BASIC METABOLIC PANEL
ANION GAP: 7 (ref 5–15)
BUN: 15 mg/dL (ref 6–20)
CALCIUM: 8.2 mg/dL — AB (ref 8.9–10.3)
CO2: 28 mmol/L (ref 22–32)
Chloride: 103 mmol/L (ref 101–111)
Creatinine, Ser: 0.76 mg/dL (ref 0.61–1.24)
GFR calc Af Amer: >60 mL/min (ref >60)
Glucose, Bld: 191 mg/dL — ABNORMAL HIGH (ref 65–99)
POTASSIUM: 3.8 mmol/L (ref 3.5–5.1)
SODIUM: 138 mmol/L (ref 135–145)

## 2016-08-03 MED ORDER — PREDNISONE 20 MG PO TABS
40.0000 mg | ORAL_TABLET | Freq: Every day | ORAL | Status: DC
  Administered 2016-08-04 – 2016-08-08 (×5): 40 mg via ORAL
  Filled 2016-08-03 (×5): qty 2

## 2016-08-03 NOTE — Progress Notes (Signed)
Interpreter [name] for [place] and Sam RN

## 2016-08-03 NOTE — Progress Notes (Signed)
Initial Nutrition Assessment  DOCUMENTATION CODES:   Not applicable  INTERVENTION:   -Continue Ensure Enlive po BID, each supplement provides 350 kcal and 20 grams of protein  NUTRITION DIAGNOSIS:   Increased nutrient needs related to chronic illness (IPF) as evidenced by estimated needs.  GOAL:   Patient will meet greater than or equal to 90% of their needs  MONITOR:   PO intake, Supplement acceptance, Labs, Weight trends, Skin, I & O's  REASON FOR ASSESSMENT:   Malnutrition Screening Tool    ASSESSMENT:   71 year old male with PMH as below, which is significant for IPF followed by Dr. Chase Caller (hypersensitivity panel positive for [name] - c/w Farmers Lung), HTN, and GERD. He was last seen by Dr. Chase Caller 06/27/2016.  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  Attempted to speak with an examine pt x 2, however, pt unavailable for interview or to complete nutrition-focused physical exam.   Pt familiar to this RD from prior admissions. Pt consumes Ensure supplements at home and enjoys them.   Reviewed wt hx, which reveals wt gain trend over the 3 months, which is favorable, given pt's hx of malnutrition.   Labs reviewed.   Diet Order:  Diet regular Room service appropriate? Yes; Fluid consistency: Thin  Skin:  Reviewed, no issues  Last BM:  08/02/16  Height:   Ht Readings from Last 1 Encounters:  08/01/16 _0  (1.626 m)    Weight:   Wt Readings from Last 1 Encounters:  08/01/16 121 lb (54.9 kg)    Ideal Body Weight:  59.1 kg  BMI:  Body mass index is 20.77 kg/m.  Estimated Nutritional Needs:   Kcal:  1700-1900  Protein:  85-100 grams  Fluid:  >1.7 L  EDUCATION NEEDS:   Education needs no appropriate at this time  Jasalyn Frysinger A. Jimmye Norman, RD, LDN, CDE Pager: [phone] After hours Pager: [phone]

## 2016-08-03 NOTE — Progress Notes (Signed)
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 Afebrile Bilateral scattered crackles, no rhonchi S1-S2 normal, no edema  Blood cultures showed 1/2 Strep viridans likely contaminant WBC count has increased from 15-27 which may be steroid induced Recommend- continue treatment for HCAP with empiric antibiotics dc Solu-Medrol and change  to prednisone 40 mg daily  Rigoberto Noel MD

## 2016-08-03 NOTE — Progress Notes (Signed)
Name: [name] MRN: [id number] DOB: 1945-06-04    ADMISSION DATE:  08/01/2016 CONSULTATION DATE:  7/10  REFERRING MD :  Dr. Wilson Singer  CHIEF COMPLAINT:  Dyspnea  BRIEF PATIENT SUMARY:  Pt is spanish speaking; therefore, this HPI is obtained via translation from grandchild (persmission granted by patient). 71 year old male with PMH as below, which is significant for IPF followed by Dr. Chase Caller (hypersensitivity panel positive for [name] - c/w Farmers Lung), HTN, and GERD. He was last seen by Dr. Chase Caller 06/27/2016.  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 He is chronically on 4L O2 along with daily prednisone.  He was also recently started on [name] and was being referred to [facility] for further evaluation by their transplant team.  He was brought to [facility] ED 7/10 with worsening SOB x 1 week.  He had associated cough productive of yellow sputum along with pleuritic chest pain.  Denies N/V/D, abd pain, myalgias, exposures to known sick contacts, recent travel.  CXR was c/w chronic IPF changes but no clear infiltrate; though history concerning for bronchitis vs PNA.  He was subsequently admitted for further management.  SUBJECTIVE:  Afebrile Currently at baseline 4L O2 and sats 95%.   Interpreter used, patient with concerns for ongoing "fatigue" and shortness of breath with minimal exertion, even difficulty having energy going to bathroom.    VITAL SIGNS: Temp:  [97.4 F (36.3 C)-97.9 F (36.6 C)] 97.5 F (36.4 C) (07/12 0554) Pulse Rate:  [93-106] 95 (07/12 0554) Resp:  [18-22] 22 (07/12 0554) BP: (119-133)/(67-76) 133/76 (07/12 0554) SpO2:  [99 %-100 %] 99 % (07/12 0554)  PHYSICAL EXAMINATION: General:  Hispanic male lying in bed in NAD HEENT: MM pink/moist, sclerae anicteric, poor dentition Neuro: Alert, appropriate, MAE CV: s1s2  rrr, no murmur PULM: even/non-labored, RR 28, bibasilar crackles, no wheezing GI: soft, non-tender, bs active  Extremities: warm/dry, no edema  Skin: no rashes or lesions   Recent Labs Lab 08/01/16 0956 08/02/16 0733 08/03/16 0358  NA 136 136 138  K 3.7 4.2 3.8  CL 96* 101 103  CO2 32 28 28  BUN _0 CREATININE 0.96 0.76 0.76  GLUCOSE 125* 176* 191*    Recent Labs Lab 08/01/16 0956 08/02/16 0733 08/03/16 0358  HGB 15.1 13.0 12.6*  HCT 47.2 40.2 37.9*  WBC 19.1* 20.9* 27.1*  PLT 313 292 310   No results found.  SIGNIFICANT EVENTS  7/10 > admit.  STUDIES:  CXR 7/10 > chronic scarring.  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  Has been on daily pred, 4L baseline O2, and recently started on [name].  Also recently referred to [facility] for transplant eval. AoC hypoxic respiratory failure - due to above. Concern for bronchitis vs HCAP. Plan: O2 for sats > 90%, currently at baseline home 4L [place] Solumedrol 27m daily, taper started 7/11 abx as below Continue [name]. Levalbuterol PRN benzonatate PRN for cough Pulmonary hygiene with IS and ambulate Send sputum cx Likely to d/c home if progressing   Increasing Leukocytosis - possibly steroid induced vs infection - BC show 1/2 strep viridans- likely contaminate  - afebrile  -  WBC 20->27 Plan: Continue vanc and cefepime  Await sensitives  Send sputum cx  Hx HTN. Plan: Continue Cardizem.  GERD. Plan: Continue PPI.   Best Practice: Code Status:  Full. Diet: Regular. DVT prophylaxis:  SCD's / Heparin. Dispo: Tele.   Kennieth Rad, AGACNP-BC [facility] Pgr: [phone] or if no answer [phone] 08/03/2016, 11:57 AM

## 2016-08-03 NOTE — Progress Notes (Signed)
Interpreter [name] for pacient

## 2016-08-04 LAB — BASIC METABOLIC PANEL
ANION GAP: 8 (ref 5–15)
BUN: 12 mg/dL (ref 6–20)
CO2: 31 mmol/L (ref 22–32)
Calcium: 8.9 mg/dL (ref 8.9–10.3)
Chloride: 100 mmol/L — ABNORMAL LOW (ref 101–111)
Creatinine, Ser: 0.83 mg/dL (ref 0.61–1.24)
GFR calc Af Amer: >60 mL/min (ref >60)
Glucose, Bld: 120 mg/dL — ABNORMAL HIGH (ref 65–99)
POTASSIUM: 4.8 mmol/L (ref 3.5–5.1)
SODIUM: 139 mmol/L (ref 135–145)

## 2016-08-04 LAB — CBC
HCT: 41.3 % (ref 39.0–52.0)
Hemoglobin: 13.8 g/dL (ref 13.0–17.0)
MCH: 31 pg (ref 26.0–34.0)
MCHC: 33.4 g/dL (ref 30.0–36.0)
MCV: 92.8 fL (ref 78.0–100.0)
PLATELETS: 298 10*3/uL (ref 150–400)
RBC: 4.45 MIL/uL (ref 4.22–5.81)
RDW: 14.4 % (ref 11.5–15.5)
WBC: 18.2 10*3/uL — AB (ref 4.0–10.5)

## 2016-08-04 MED ORDER — DOCUSATE SODIUM 100 MG PO CAPS
100.0000 mg | ORAL_CAPSULE | Freq: Two times a day (BID) | ORAL | Status: DC | PRN
  Administered 2016-08-05 – 2016-08-07 (×2): 100 mg via ORAL
  Filled 2016-08-04 (×2): qty 1

## 2016-08-04 NOTE — Hospital Discharge Follow-Up (Signed)
Transitional Care Clinic Care Coordination Note:  Met with the patient today, His grandson, [name] was present. [name], [facility]/scheduler was also present and was the  [place] interpreter with the patient's permission.   Admit date:  08/01/16 Discharge date: TBD Discharge Disposition: home Patient contact:  # [phone] is the best # as per patient Emergency contact(s): daughter [name] and son [name] - numbers are in EPIC  This Case Manager reviewed patient's EMR and determined patient would benefit from post-discharge medical management and chronic care management services through the [facility]. Patient has a history of idiopathic pulmonary fibrosis, hypersensitivity pneumonitis, HTN, GERD.  He was admitted with a cough and increasing shortness of breath. He has 3 hospital admissions and 2 ED visits in the past 3 months.  He is currently being treated for bronchitis/HCAP This Case Manager met with patient to discuss the services and medical management that can be provided at the [facility]. Patient verbalized understanding and agreed to receive post-discharge care at the [facility].   Patient scheduled for Transitional Care appointment on 08/16/16 @ 1400 .   The patient requested an afternoon appointment and did not want an appointment on Tues or Thurs because his wife has dialysis those days.  Clinic information and appointment time provided to patient. Appointment information also placed on AVS.  Assessment:       Home Environment: lives in a trailer with his wife but his extended family lives in the neighborhood according to [place].  There is a ramp entering the home.        Support System: His son and daughter, [name] are the primary support persons.       Level of functioning: needs some assistance with ADLs/IADLs       Home DME: O2, bedside commode, RW, wheelchair. He is currently using the O2 @ 6L/min when ambulating.        Home care services:  (services arranged prior to discharge or new services after discharge) none at present time       Transportation: son - [name] is the primary driver.         Food/Nutrition: (ability to afford, access, use of any community resources): no problems reported accessing food. Family will help with grocery shopping and meal preparation        Medications: (ability to afford, access, compliance, Pharmacy used): uses [facility] pharmacy and noted that pulmonary has assisted him with obtaining some of his pulmonary medications.          Identified Barriers: no insurance - has applied for [employer], community services are limited without insurance, he stated that he was referred for a lung transplant but because of his " situation" he is not eligible. Lack of income.         PCP (Name, office location, phone number): Dr Wynetta Emery - [facility]  Patient Education: explained the services available at [facility] including social work and financial counseling in addition to pharmacy assistance.             Arranged services:        Services communicated to Whitman Hero, RN CM

## 2016-08-04 NOTE — Progress Notes (Signed)
Initial Nutrition Assessment  DOCUMENTATION CODES:   Non-severe (moderate) malnutrition in context of chronic illness  INTERVENTION:   -Continue Ensure Enlive po BID, each supplement provides 350 kcal and 20 grams of protein  NUTRITION DIAGNOSIS:   Malnutrition (Mild) related to chronic illness (IPF) as evidenced by mild depletion of muscle mass, moderate depletions of muscle mass, mild depletion of body fat, moderate depletion of body fat.  Ongoing  GOAL:   Patient will meet greater than or equal to 90% of their needs  Progressing  MONITOR:   PO intake, Supplement acceptance, Labs, Weight trends, Skin, I & O's  REASON FOR ASSESSMENT:   Malnutrition Screening Tool    ASSESSMENT:   71 year old male with PMH as below, which is significant for IPF followed by Dr. Chase Caller (hypersensitivity panel positive for [name] - c/w [employer] [facility]), HTN, and GERD. He was last seen by Dr. Chase Caller 06/27/2016.  He is from [place] and worked in [place] for most of his life but most recently has been in [place] living with children and helping sons with Special educational needs teacher.  Spoke with pt and grandson at bedside. They reports appetite remains very good with no changes in appetite since last hospitalization. Pt typically consumes 3 meals and one snack daily. He reports he continues to consume 2 Ensure supplements daily. Per pt grandson, consumed 100% of breakfast.   Both confirmed wt gain trend, which is favorable, given hx of malnutrition.   Nutrition-Focused physical exam completed. Findings are mild to moderate fat depletion, mild to moderate muscle depletion, and no edema. Pt's exam is greatly improved, from RD's recollection from last hospitalization.  Discussed importance of continued good meal and supplement completion to promote healing and assist with weight gain.   Labs reviewed.   Diet Order:  Diet regular Room service appropriate? Yes; Fluid consistency:  Thin  Skin:  Reviewed, no issues  Last BM:  08/02/16  Height:   Ht Readings from Last 1 Encounters:  08/01/16 _0  (1.626 m)    Weight:   Wt Readings from Last 1 Encounters:  08/01/16 121 lb (54.9 kg)    Ideal Body Weight:  59.1 kg  BMI:  Body mass index is 20.77 kg/m.  Estimated Nutritional Needs:   Kcal:  1700-1900  Protein:  85-100 grams  Fluid:  >1.7 L  EDUCATION NEEDS:   Education needs no appropriate at this time  Eliah Ozawa A. Jimmye Norman, RD, LDN, CDE Pager: [phone] After hours Pager: [phone]

## 2016-08-04 NOTE — Progress Notes (Signed)
Attempted walking oxygen saturation however patient dropped to 74% on 4 liters oxygen while just standing. He required 6 liters to maintain 90%. Will attempt again 08/04/16

## 2016-08-04 NOTE — Progress Notes (Signed)
SATURATION   Patient Saturations on 4 liters nasal canula at Rest = 98% Patient Saturations on 6 Liters of oxygen while standing= 90% Patient Saturations on 6 Liters of oxygen while Ambulating 150 ft = 86%  Provider notified

## 2016-08-04 NOTE — Progress Notes (Signed)
71 year old with hypersensitivity pneumonitis/ pulmonary fibrosis, on 4 L oxygen at baseline being treated for bronchitis/HCAP  He still has significant dyspnea and yellow sputum. Afebrile Bilateral scattered crackles, no rhonchi S1-S2 normal, no edema  Blood cultures showed 1/2 Strep viridans likely contaminant WBC count peaked at 27 and is now coming down to 18 K, this seems to be steroid-induced Recommend- continue treatment for HCAP with empiric cefepime, discontinue vancomycin, await sputum culture Continue prednisone 40 mg daily -baseline dose is 20 mg  Discharge goal-when he can walk to the bathroom without significant desaturation. He needed 6 L of oxygen simply on standing up and appears very weak  I have asked his son to check his oxygen concentrator at home to see how high this goes. We may have to keep him over the weekend to see if leukocytosis improves and oxygen requirements come down  Rigoberto Noel. MD

## 2016-08-05 DIAGNOSIS — E440 Moderate protein-calorie malnutrition: Secondary | ICD-10-CM | POA: Insufficient documentation

## 2016-08-05 LAB — CULTURE, BLOOD (ROUTINE X 2): Special Requests: ADEQUATE

## 2016-08-05 LAB — BRAIN NATRIURETIC PEPTIDE: B Natriuretic Peptide: 18 pg/mL (ref 0.0–100.0)

## 2016-08-05 MED ORDER — FUROSEMIDE 10 MG/ML IJ SOLN
40.0000 mg | Freq: Once | INTRAMUSCULAR | Status: AC
  Administered 2016-08-05: 40 mg via INTRAVENOUS
  Filled 2016-08-05: qty 4

## 2016-08-05 MED ORDER — ACETAMINOPHEN 325 MG PO TABS
650.0000 mg | ORAL_TABLET | Freq: Four times a day (QID) | ORAL | Status: DC | PRN
  Administered 2016-08-05: 650 mg via ORAL
  Filled 2016-08-05: qty 2

## 2016-08-05 NOTE — Care Management Note (Addendum)
Case Management Note  Patient Details  Name: [name] MRN: [id number] Date of Birth: 30-Aug-1945  Subjective/Objective:                 Assessment:       Home Environment: lives in a trailer with his wife but his extended family lives in the neighborhood according to [place].  There is a ramp entering the home.        Support System: His son and daughter, Lenell Antu are the primary support persons.       Level of functioning: needs some assistance with ADLs/IADLs       Home DME: O2, bedside commode, RW, wheelchair. He is currently using the O2 @ 6L/min when ambulating.        Home care services: (services arranged prior to discharge or new services after discharge) none at present time       Transportation: son - Mel Almond is the primary driver.         Food/Nutrition: (ability to afford, access, use of any community resources): no problems reported accessing food. Family will help with grocery shopping and meal preparation        Medications: (ability to afford, access, compliance, Pharmacy used): uses [facility] pharmacy and noted that pulmonary has assisted him with obtaining some of his pulmonary medications.          Identified Barriers: no insurance - has applied for [employer], community services are limited without insurance, he stated that he was referred for a lung transplant but because of his " situation" he is not eligible. Lack of income.         PCP Dr Wynetta Emery [facility]  Patient has a 10L concentrator at home, from [facility] for his oxygen.    Action/Plan:   Expected Discharge Date:                  Expected Discharge Plan:     In-House Referral:     Discharge planning Services     Post Acute Care Choice:    Choice offered to:     DME Arranged:    DME Agency:     HH Arranged:    HH Agency:     Status of Service:     If discussed at [employer] of Stay Meetings, dates discussed:    Additional Comments:  Carles Collet, RN 08/05/2016, 2:17 PM

## 2016-08-05 NOTE — Progress Notes (Signed)
Name: [name] MRN: [id number] DOB: 09-08-1945    ADMISSION DATE:  08/01/2016 CONSULTATION DATE:  7/10  REFERRING MD :  Dr. Wilson Singer  CHIEF COMPLAINT:  Dyspnea  BRIEF PATIENT SUMARY:  Pt is spanish speaking; therefore, this HPI is obtained via translation from grandchild (persmission granted by patient). 71 year old male with PMH as below, which is significant for IPF followed by Dr. Chase Caller (hypersensitivity panel positive for [name] - c/w [employer] [facility]), HTN, and GERD. He was last seen by Dr. Chase Caller 06/27/2016.  He is from [place] and worked in [place] for most of his life but most recently has been in [place] living with children and helping sons with Special educational needs teacher. He is chronically on 4L O2 along with daily prednisone.  He was also recently started on [name] and was being referred to [facility] for further evaluation by their transplant team.  He was brought to [facility] ED 7/10 with worsening SOB x 1 week.  He had associated cough productive of yellow sputum along with pleuritic chest pain.  Denies N/V/D, abd pain, myalgias, exposures to known sick contacts, recent travel.  CXR was c/w chronic IPF changes but no clear infiltrate; though history concerning for bronchitis vs PNA.  He was subsequently admitted for further management.  SUBJECTIVE:  Still very fatigued  VITAL SIGNS: Temp:  [97.7 F (36.5 C)-98 F (36.7 C)] 97.9 F (36.6 C) (07/14 0503) Pulse Rate:  [77-101] 81 (07/14 0503) Resp:  [16-17] 17 (07/14 0503) BP: (127-133)/(70-79) 128/70 (07/14 0503) SpO2:  [95 %-100 %] 99 % (07/14 0503) 4 liters  PHYSICAL EXAMINATION: General appearance:  71 Year old  Male cachectic  NAD,  conversant  Eyes: anicteric sclerae, moist conjunctivae; PERRL, EOMI bilaterally. Mouth:  membranes and no mucosal ulcerations; normal hard and soft palate Neck: Trachea midline; neck supple, no JVD Lungs/chest: dry velcro crackles posteriorly CV: RRR, no MRGs  Abdomen:  Soft, non-tender; no masses or HSM Extremities: No peripheral edema or extremity lymphadenopathy Skin: Normal temperature, turgor and texture; no rash, ulcers or subcutaneous nodules Psych: Appropriate affect, alert and oriented to person, place and time    Recent Labs Lab 08/02/16 0733 08/03/16 0358 08/04/16 0715  NA 136 138 139  K 4.2 3.8 4.8  CL 101 103 100*  CO2 _0 BUN _1 CREATININE 0.76 0.76 0.83  GLUCOSE 176* 191* 120*    Recent Labs Lab 08/02/16 0733 08/03/16 0358 08/04/16 0715  HGB 13.0 12.6* 13.8  HCT 40.2 37.9* 41.3  WBC 20.9* 27.1* 18.2*  PLT 292 310 298   No results found.  SIGNIFICANT EVENTS  7/10 > admit.  STUDIES:  CXR 7/10 > chronic scarring.  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  Has been on daily pred, 4L baseline O2, and recently started on [name].  Also recently referred to [facility] for transplant eval. AoC hypoxic respiratory failure - due to above. Concern for bronchitis vs HCAP. Plan: Complete 7 d abx Lasix x1 Cont pred at 40/d; baseline is 20/d. Likely d/c on 40 and reduce by 10 mg in one week Cont supportive care Can go home when can tolerated ambulation on 6 liters and not desaturate (need to ensure his oxygen can provide this at home)  Leukocytosis -  possibly steroid induced vs infection - BC show 1/2 strep viridans- likely contaminate  - afebrile WBC trending down  Plan: Day 4/7 cefepime Repeat BCX2   Hx HTN. Plan: Cont ccb  GERD. Plan: Continue PPI.  Erick Colace ACNP-BC [place] Pager # [phone] OR # [phone] if no answer

## 2016-08-06 LAB — MAGNESIUM: MAGNESIUM: 2.1 mg/dL (ref 1.7–2.4)

## 2016-08-06 LAB — BASIC METABOLIC PANEL
Anion gap: 7 (ref 5–15)
BUN: 15 mg/dL (ref 6–20)
CHLORIDE: 97 mmol/L — AB (ref 101–111)
CO2: 32 mmol/L (ref 22–32)
CREATININE: 0.89 mg/dL (ref 0.61–1.24)
Calcium: 8.5 mg/dL — ABNORMAL LOW (ref 8.9–10.3)
GFR calc Af Amer: >60 mL/min (ref >60)
Glucose, Bld: 163 mg/dL — ABNORMAL HIGH (ref 65–99)
Potassium: 3.6 mmol/L (ref 3.5–5.1)
SODIUM: 136 mmol/L (ref 135–145)

## 2016-08-06 LAB — EXPECTORATED SPUTUM ASSESSMENT W GRAM STAIN, RFLX TO RESP C

## 2016-08-06 LAB — EXPECTORATED SPUTUM ASSESSMENT W REFEX TO RESP CULTURE

## 2016-08-06 LAB — CULTURE, BLOOD (ROUTINE X 2)
Culture: NO GROWTH
Special Requests: ADEQUATE

## 2016-08-06 LAB — BRAIN NATRIURETIC PEPTIDE: B NATRIURETIC PEPTIDE 5: 12.8 pg/mL (ref 0.0–100.0)

## 2016-08-06 NOTE — Progress Notes (Signed)
Name: [name] MRN: [id number] DOB: 1945-06-03    ADMISSION DATE:  08/01/2016 CONSULTATION DATE:  7/10  REFERRING MD :  Dr. Wilson Singer  CHIEF COMPLAINT:  Dyspnea  BRIEF PATIENT SUMARY:  Pt is spanish speaking; therefore, this HPI is obtained via translation from grandchild (persmission granted by patient). 71 year old male with PMH as below, which is significant for IPF followed by Dr. Chase Caller (hypersensitivity panel positive for [name] - c/w [employer] [facility]), HTN, and GERD. He was last seen by Dr. Chase Caller 06/27/2016.  He is from [place] and worked in [place] for most of his life but most recently has been in [place] living with children and helping sons with Special educational needs teacher. He is chronically on 4L O2 along with daily prednisone.  He was also recently started on [name] and was being referred to [facility] for further evaluation by their transplant team.  He was brought to [facility] ED 7/10 with worsening SOB x 1 week.  He had associated cough productive of yellow sputum along with pleuritic chest pain.  Denies N/V/D, abd pain, myalgias, exposures to known sick contacts, recent travel.  CXR was c/w chronic IPF changes but no clear infiltrate; though history concerning for bronchitis vs PNA.  He was subsequently admitted for further management.  SUBJECTIVE:  Still does not feel well   VITAL SIGNS: Temp:  [97.8 F (36.6 C)-98.1 F (36.7 C)] 97.8 F (36.6 C) (07/15 1108) Pulse Rate:  [74-107] 94 (07/15 1108) Resp:  [17-20] 17 (07/15 0630) BP: (109-118)/(63-74) 109/68 (07/15 1108) SpO2:  [94 %-100 %] 100 % (07/15 1108) 4 liters  PHYSICAL EXAMINATION: General appearance:  71 year old  Male cachectic  NAD, conversant  Eyes: anicteric sclerae, moist conjunctivae; PERRL, EOMI bilaterally. Mouth:  membranes and no mucosal ulcerations; normal hard and soft palate Neck: Trachea midline; neck supple, no JVD Lungs/chest: velcro crackles posteriorly with mild  Increased  respiratory effort but no  intercostal retractions CV: RRR, no MRGs  Abdomen: Soft, non-tender; no masses or HSM Extremities: No peripheral edema or extremity lymphadenopathy Skin: Normal temperature, turgor and texture; no rash, ulcers  Psych: Appropriate affect, alert and oriented to person, place and time      Recent Labs Lab 08/03/16 0358 08/04/16 0715 08/06/16 0412  NA 138 139 136  K 3.8 4.8 3.6  CL 103 100* 97*  CO2 28 31 32  BUN _0 CREATININE 0.76 0.83 0.89  GLUCOSE 191* 120* 163*    Recent Labs Lab 08/02/16 0733 08/03/16 0358 08/04/16 0715  HGB 13.0 12.6* 13.8  HCT 40.2 37.9* 41.3  WBC 20.9* 27.1* 18.2*  PLT 292 310 298   No results found.  SIGNIFICANT EVENTS  7/10 > admit.  STUDIES:  CXR 7/10 > chronic scarring.  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  Has been on daily pred, 4L baseline O2, and recently started on [name].  Also recently referred to [facility] for transplant eval. AoC hypoxic respiratory failure - due to above. Concern for bronchitis vs HCAP. Plan: Complete 7 d abx Dc on 40 pred. Reduce by 78m/week to baseline 265mweek Verified that he has up to 10 liters O2 at home  Oxygen 4 liters rest, 6 liters w/ activity. Will order him a oximizer  Send  repeat sputum   Leukocytosis - possibly steroid induced vs infection - BC show 1/2 strep viridans- likely contaminate  - afebrile WBC trending down  Plan: Day 5/7 abx Repeated BC  Hx HTN. Plan: Cont CCB  GERD. Plan: Cont PPI   Erick Colace ACNP-BC [place] Pager # [phone] OR # [phone] if no answer

## 2016-08-07 ENCOUNTER — Telehealth: Payer: Self-pay

## 2016-08-07 NOTE — Progress Notes (Signed)
Name: [name] MRN: [id number] DOB: Jun 16, 1945    ADMISSION DATE:  08/01/2016 CONSULTATION DATE:  7/10  REFERRING MD :  Dr. Wilson Singer  CHIEF COMPLAINT:  Dyspnea  BRIEF PATIENT SUMARY:  Pt is spanish speaking; therefore, this HPI is obtained via translation from grandchild (persmission granted by patient). 71 year old male with PMH as below, which is significant for IPF followed by Dr. Chase Caller (hypersensitivity panel positive for [name] - c/w [employer] [facility]), HTN, and GERD. He was last seen by Dr. Chase Caller 06/27/2016.  He is from [place] and worked in [place] for most of his life but most recently has been in [place] living with children and helping sons with Special educational needs teacher. He is chronically on 4L O2 along with daily prednisone.  He was also recently started on [name] and was being referred to [facility] for further evaluation by their transplant team.  He was brought to [facility] ED 7/10 with worsening SOB x 1 week.  He had associated cough productive of yellow sputum along with pleuritic chest pain.  Denies N/V/D, abd pain, myalgias, exposures to known sick contacts, recent travel.  CXR was c/w chronic IPF changes but no clear infiltrate; though history concerning for bronchitis vs PNA.  He was subsequently admitted for further management.  SUBJECTIVE: As interpreted via grandson. Still not feeling any better. Cough production has improved slightly.   VITAL SIGNS: Temp:  [97.8 F (36.6 C)-98.3 F (36.8 C)] 98 F (36.7 C) (07/16 0624) Pulse Rate:  [79-106] 79 (07/16 0624) Resp:  [16-20] 20 (07/16 0624) BP: (109-123)/(68-74) 118/68 (07/16 0624) SpO2:  [97 %-100 %] 99 % (07/16 0624) 4 liters  PHYSICAL EXAMINATION:  General:  Elderly male in NAD eating breakfast in BED Neuro:  Alert, oriented, non-focal as translated through grandson.  HEENT:  [place]/AT, No JVD noted, PERRL Cardiovascular:  RRR, no MRG Lungs:  Coarse bilateral breath sounds, rales bilateral.    Abdomen:  Soft, non-distended, non-tender Musculoskeletal:  No acute deformity or ROM limitation Skin:  Intact, MMM    Recent Labs Lab 08/03/16 0358 08/04/16 0715 08/06/16 0412  NA 138 139 136  K 3.8 4.8 3.6  CL 103 100* 97*  CO2 28 31 32  BUN _0 CREATININE 0.76 0.83 0.89  GLUCOSE 191* 120* 163*    Recent Labs Lab 08/02/16 0733 08/03/16 0358 08/04/16 0715  HGB 13.0 12.6* 13.8  HCT 40.2 37.9* 41.3  WBC 20.9* 27.1* 18.2*  PLT 292 310 298   No results found.  SIGNIFICANT EVENTS  7/10 > admit.  STUDIES:  CXR 7/10 > chronic scarring.  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  Has been on daily pred, 4L baseline O2, and recently started on [name].  Also recently referred to [facility] for transplant eval. AoC hypoxic respiratory failure - due to above. Concern for bronchitis vs HCAP. Plan: Supplemental oxygen 4 liters rest, 6 liters w/ activity. Oximizer ordered 7/15.  Verified that he has up to 10 liters O2 at home  Continue steroids. Dc on 40 pred. Reduce by 47m/week to baseline 221mweek [name]  Leukocytosis - possibly steroid induced vs infection - BC show 1/2 strep viridans- likely contaminate  - afebrile WBC trending down  Plan: Day 6/7 Cefepime Repeat  blood and sputum cultures pending.   Hx HTN. Plan: Cont CCB  GERD. Plan: Cont PPI  Diet  Global: It is unfortunate he is not feeling better, however, the typical progression of pulmonary fibrosis is that with each flare and new baseline is set. While he may improve slightly, it is very possible that he may not. He is requiring increased O2, which may be his new baseline. His last day of ABX is 7/17 and we will plan for discharge at then, assuming he remains on his projected  course. Should have goals of care discussion prior to discharge as he is a poor candidate for aggressive resuscitative measures.  Georgann Housekeeper, AGACNP-BC [place] [facility] Pager [phone] or [phone]  08/07/2016 8:27 AM   Attending Note:  I have examined patient, reviewed labs, studies and notes. I have discussed the case with Jaclynn Guarneri, and I agree with the data and plans as amended above. 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 Admitted with bronchitis versus pneumonia (versus acute ILD flare) with associated hypoxemic respiratory failure. He has improved slowly, is not back to his usual baseline. Continues to have increased oxygen need. His antibiotics will end on 7/17. He has 1 of 4 cultures positive for strep viridans. This likely represents a skin contaminant, would defer echocardiogram at this time. Continue current steroids, plan to wean back to his baseline 20 mg daily as an outpatient. Suspect he will discharged home on 7/17 after his antibiotics are completed.  Baltazar Apo, MD, PhD 08/07/2016, 1:44 PM [facility] [phone] or if no answer [phone] [

## 2016-08-07 NOTE — Plan of Care (Signed)
Problem: Safety: Goal: Ability to remain free from injury will improve Outcome: Progressing Pt will be free from falls and injuries during this hospitalization.

## 2016-08-07 NOTE — Telephone Encounter (Signed)
Met with the patient's daughter, [name], when she came to the clinic today. Kellie Moor, [place] acted as Administrator, sports. [name] stated that her father is not doing well and has needed more oxygen. She noted that they have not mentioned a discharge date yet. She confirmed that she would be able to drive him to his appointment at [facility] on 08/16/16.

## 2016-08-07 NOTE — Progress Notes (Signed)
Laboratory, Micro called this RN and reported that the sputum culture was not adequate as it had a lot of saliva. They advised to get a new order from the MD. MD on call was called. New sputum order was put in. Will continue to monitor.

## 2016-08-08 MED ORDER — PREDNISONE 20 MG PO TABS: ORAL_TABLET | ORAL | 0 refills | Status: AC

## 2016-08-08 MED ORDER — PREDNISONE 20 MG PO TABS: ORAL_TABLET | ORAL | 0 refills | Status: DC

## 2016-08-08 MED FILL — ?PREDNISONE 20 MG TABLET: 20 | 49 days supply | Qty: 60 | Fill #0

## 2016-08-08 NOTE — Discharge Summary (Signed)
Physician Discharge Summary       Patient ID: [name] MRN: [id number] DOB/AGE: 1945-01-31 71 y.o.  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 He was last seen by Dr. Chase Caller 06/27/2016.  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 He is chronically on 4L O2 along with daily prednisone.  He was also recently started on [name] and was being referred to [facility] for further evaluation by their transplant team. He was brought to [facility] ED 7/10 with worsening SOB x 1 week.  He had associated cough productive of yellow sputum along with pleuritic chest pain.  Denies N/V/D, abd pain, myalgias, exposures to known sick contacts, recent travel. CXR was c/w chronic IPF changes but no clear infiltrate; though history concerning for bronchitis vs PNA.  He was subsequently admitted 7/10 for further management.  Hospital Course:  He was admitted to the pulmonary service and treated with IV steroids, empiric ABX for possible PNA in immunocompromised host, and was continued on his preadmission [name]. Despite these therapies he was slow to improve. After being narrowed on ABX and weaned to prednisone 64m daily he continued to require 5L via [place] with poor exercise tolerance. He and his family were expecting a return to baseline prior to discharge and seem to reluctantly understand that with pulmonary fibrosis there may not be a return to his former baseline. After 7 days of inpatient therapy including a  full course of CAP antibiotics and increased steroids he was deemed a candidate for discharge 7/17.   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 Likely with acute flare and acute on chronic hypoxemic respiratory failure.  - Supplemental oxygen 4 liters rest, 6 liters w/ activity.  - Oximizer ordered. - Will discharge on 456mday prednisone. Reduce by 1063meek to baseline 45m70mek - Continue [name]  Concern for bronchitis vs HCAP- BC show 1/2 strep viridans - likely contaminate  - Has completed 7 day antibiotic regimen.  - Will follow repeat cultures. No growth to date.   Hx HTN. - Continue diltiazem  GERD. - Continue omeprazole - Diet    Significant Hospital tests/ studies   STUDIES:  CXR 7/10 > chronic scarring.  Cultures:  7/10 BCx2 >> 1/2 viridans strep> 7/14 BCX2>>> Sputum 7/15 > >>  ABX:  7/10 levaquin >>7/10 7/10 vancomycin >>7/13 7/11 cefepime >>   Consults none  Discharge Exam: BP 116/68 (BP Location: Right Arm)   Pulse 74   Temp 97.8 F (36.6 C) (Oral)   Resp 18   Ht _0  (1.626 m)   Wt 54.9 kg (121 lb)   SpO2 99%   BMI 20.77 kg/m   General:  Elderly male resting comfortably in bed Neuro:  Alert, oriented, non-focal HEENT:  [place]/AT, No JVD noted, PERRL Cardiovascular:  RRR, no MRG Lungs:  Coarse bilateral breath sounds. Normal effort at rest.  Abdomen:  Soft, non-distended Musculoskeletal:  No acute deformity Skin:  Intact, MMM    Labs  at discharge Lab Results  Component Value Date   CREATININE 0.89 08/06/2016   BUN 15 08/06/2016   NA 136 08/06/2016   K 3.6 08/06/2016   CL 97 (L) 08/06/2016   CO2 32 08/06/2016   Lab Results  Component Value Date   WBC 18.2 (H) 08/04/2016   HGB 13.8 08/04/2016   HCT 41.3 08/04/2016   MCV 92.8 08/04/2016   PLT 298 08/04/2016   Lab Results  Component Value Date   ALT 26 08/01/2016   AST 24 08/01/2016   ALKPHOS 52 08/01/2016   BILITOT 0.7  08/01/2016   Lab Results  Component Value Date   INR 1.07 08/01/2016   INR 1.12 06/07/2016    Current radiology studies No results found.  Disposition:  01-Home or Self Care  Discharge Instructions    Call MD for:  difficulty breathing, headache or visual disturbances    Complete by:  As directed    Call MD for:  persistant dizziness or light-headedness    Complete by:  As directed    Diet - low sodium heart healthy    Complete by:  As directed    Discharge instructions    Complete by:  As directed    Keep oxygen set to 4L at rest, but may increase to 6L as needed with ambulation.   Increase activity slowly    Complete by:  As directed      Allergies as of 08/08/2016      Reactions   Cyclobenzaprine Swelling, Other (See Comments)   Tongue and lips swell      Medication List    TAKE these medications   acetaminophen 325 MG tablet Commonly known as:  TYLENOL Take 325 mg by mouth every 6 (six) hours as needed (for pain).   albuterol 108 (90 Base) MCG/ACT inhaler Commonly known as:  PROVENTIL HFA;VENTOLIN HFA Inhale 1-2 puffs into the lungs every 4 (four) hours as needed for wheezing or shortness of breath.   albuterol (2.5 MG/3ML) 0.083% nebulizer solution Commonly known as:  PROVENTIL Take 3 mLs (2.5 mg total) by nebulization every 4 (four) hours as needed for wheezing or shortness of breath.   calcium-vitamin D 500-200 MG-UNIT tablet Commonly known as:  OSCAL WITH D Take 1 tablet by mouth 2 (two) times daily.   diltiazem 120 MG 24 hr capsule Commonly known as:  CARDIZEM CD Take 1 capsule (120 mg total) by mouth daily.   [name] 267 MG Caps Generic drug:  Pirfenidone Take 534 mg by mouth 3 (three) times daily.   Glycopyrrolate-Formoterol 9-4.8 MCG/ACT Aero Commonly known as:  BEVESPI AEROSPHERE Inhale 2 puffs into the lungs 2 (two) times daily.   omeprazole 40 MG capsule Commonly known as:  PRILOSEC Take 1 capsule (40 mg total) by mouth daily.     predniSONE 20 MG tablet Commonly known as:  DELTASONE Take 2 tablets (40 mg) by mouth daily starting 7/18. Then take 1 and 1/2 tablets (68m) daily starting 7/25. Then take 1 tablet daily. What changed:  medication strength  additional instructions            Durable Medical Equipment        Start     Ordered   08/08/16 [phone] For home use only DME Bedside commode  Once    Question:  Patient needs a bedside commode to treat with the following condition  Answer:  Chronic respiratory failure with hypoxia ([place]   08/08/16 0942     Follow-up  Information    [facility]. Go on 08/16/2016.   Why:  at 2:00pm for an appointment in hte [facility] with Dr Lisette Grinder information: [place] [id number] [phone]       Melvenia Needles, NP Follow up on 08/14/2016.   Specialty:  Pulmonary Disease Why:  62 AM - [facility] Contact information: [address] [address] [phone]           Discharged Condition: fair  37 minutes of time have been dedicated to discharge assessment, planning and discharge instructions.   Signed: Georgann Housekeeper, AGACNP-BC [facility] Pager [phone] or [phone]  08/08/2016 10:09 AM

## 2016-08-08 NOTE — Care Management Note (Signed)
Case Management Note  Patient Details  Name: [name] MRN: [id number] Date of Birth: Dec 20, 1945  Subjective/Objective:        Admited  with Pulmonary Fibrosis.       Action/Plan: Post hospital f/u scheduled for  08/16/16 @ the [facility], refer to AVS. Plan is to d/c to home today with family. Son, Maricio to bring portable oxygen tank from home for transportation to home.  Expected Discharge Date:  08/08/16               Expected Discharge Plan:  Home/Self Care  In-House Referral:     Discharge planning Services  CM Consult, Follow-up appt scheduled, [place] Acute Care Choice:  Durable Medical Equipment, Resumption of Svcs/PTA Provider Choice offered to:  Patient  DME Arranged:    DME Agency:  [place] active with [facility] (oxygen).  HH Arranged:    [place] Agency:     Status of Service:  Completed, signed off  If discussed at [place] of Stay Meetings, dates discussed:    Additional Comments:  Sharin Mons, RN 08/08/2016, 2:22 PM

## 2016-08-08 NOTE — Progress Notes (Signed)
Pt and family given discharge instructions, prescriptions, and care notes. Pt verbalized understanding AEB no further questions or concerns at this time. IV was discontinued, no redness, pain, or swelling noted at this time. Telemetry discontinued and Centralized Telemetry was notified. Pt to leave the floor via wheelchair with staff in stable condition.

## 2016-08-09 ENCOUNTER — Telehealth: Payer: Self-pay

## 2016-08-09 NOTE — Telephone Encounter (Signed)
[facility] Post-discharge Follow-Up Phone Call:  Date of Discharge:  08/09/2015 Principal Discharge Diagnosis(es): pulmonary fibrosis, acute on chronic respiratory failure.  Post-discharge Communication: (Clearly document all attempts clearly and date contact made) call placed to patient Call Completed: Yes                    With Whom: patient Interpreter Needed: Yes   - Kellie Moor, [facility] TCC coordinator completed the call with the patient.           Language/Dialect: spanish     Please check all that apply:  X  Patient is knowledgeable of his/her condition(s) and/or treatment. ? Patient is caring for self at home.  X  Patient is receiving assist at home from family and/or caregiver. Family and/or caregiver is knowledgeable of patient's condition(s) and/or treatment. - supportive family. Lives with his wife but has multiple family members in the neighborhood.  ? Patient is receiving home health services. If so, name of agency.     Medication Reconciliation:  ? Medication list reviewed with patient. X  Patient obtained all discharge medications. If not, why? - he stated that he has all of his medications and is taking them as ordered. No questions about his medication regime and he said that he did not need to review his medication list. He is using his spirometer as instructed.    Activities of Daily Living:  ? Independent X  Needs assist (describe; ? home DME used) - family providing needed assistance. Has home O2 from [facility] that he has been using continuously. The orders are for 4L/ min and increase to 6 L/min with activity but he has been using at 5L/min which is what he was using at the hospital . ? [facility] (describe, ? home DME used)   Community resources in place for patient:  X  None  ? Home Health/Home DME ? Assisted Living ? Support Group                  Questions/Concerns discussed: confirmed his appointment for 08/16/16. His family will provide  transportation to the clinic.

## 2016-08-10 LAB — CULTURE, BLOOD (ROUTINE X 2)
Culture: NO GROWTH
Culture: NO GROWTH
SPECIAL REQUESTS: ADEQUATE
Special Requests: ADEQUATE

## 2016-08-14 ENCOUNTER — Other Ambulatory Visit (INDEPENDENT_AMBULATORY_CARE_PROVIDER_SITE_OTHER): Payer: Self-pay

## 2016-08-14 ENCOUNTER — Encounter: Payer: Self-pay | Admitting: Adult Health

## 2016-08-14 ENCOUNTER — Ambulatory Visit (INDEPENDENT_AMBULATORY_CARE_PROVIDER_SITE_OTHER): Payer: Self-pay | Admitting: Adult Health

## 2016-08-14 VITALS — BP 120/64 | HR 110 | Ht 64.0 in | Wt 120.2 lb

## 2016-08-14 DIAGNOSIS — J9611 Chronic respiratory failure with hypoxia: Secondary | ICD-10-CM

## 2016-08-14 DIAGNOSIS — J84112 Idiopathic pulmonary fibrosis: Secondary | ICD-10-CM

## 2016-08-14 DIAGNOSIS — I2723 Pulmonary hypertension due to lung diseases and hypoxia: Secondary | ICD-10-CM

## 2016-08-14 DIAGNOSIS — J849 Interstitial pulmonary disease, unspecified: Secondary | ICD-10-CM

## 2016-08-14 LAB — HEPATIC FUNCTION PANEL
ALT: 31 U/L (ref 0–53)
AST: 15 U/L (ref 0–37)
Albumin: 3.6 g/dL (ref 3.5–5.2)
Alkaline Phosphatase: 50 U/L (ref 39–117)
BILIRUBIN DIRECT: 0.1 mg/dL (ref 0.0–0.3)
BILIRUBIN TOTAL: 0.3 mg/dL (ref 0.2–1.2)
Total Protein: 6.9 g/dL (ref 6.0–8.3)

## 2016-08-14 MED ORDER — CLOTRIMAZOLE 10 MG MT TROC: 10.0000 mg | Freq: Every day | OROMUCOSAL | 0 refills | Status: AC

## 2016-08-14 MED FILL — CLOTRIMAZOLE 10MG TROCHE rx: 10 | 7 days supply | Qty: 35 | Fill #0

## 2016-08-14 NOTE — Assessment & Plan Note (Signed)
ILD -[facility]  Pt w/ recent flare -bronchitis -appears stable but high risk for decompensation due to high flow O2 needs and severe ILD changes.  He will need to restart steroids and begin slow taper to 15m daily.  Appears to be tolerating [name] , ,check LFT   Plan  Patient Instructions  Take Prednisone 450mdaily for 4 days then 3065maily for 4 days then hold at 28m60mily .  Continue on [name] 2 caps Three times a day  . Take with food.  Labs today  Continue on Oxygen 5l/m at rest and 6l/m with activity .  Mycelex five times a day for 1 week  follow up Dr. RamaChase Caller6 weeks and As needed   Please contact office for sooner follow up if symptoms do not improve or worsen or seek emergency care    Tome prednisona 40 mg al da durante 4 das y luego 30 mg al dFluvannaas y luegViacomtenga a 20 mg al da. SunTrustntinuar con [name] 2 cpsulas TresProgress Energymar con la comida. Laboratorios hoy Contine con Oxygen 5l / m en reposo y 6l / m con actividad. Mycelex cinco veces al da durante 1 semana seguir al Dr. RamaChase Caller6 semanas y segn sea necesario [place] con la oficina para un seguimiento ms temprano si los sntomas no mejoran o empeoran o buscan atencin de emerFreight forwarder

## 2016-08-14 NOTE — Assessment & Plan Note (Signed)
Cont on O2 . And current regimen .

## 2016-08-14 NOTE — Patient Instructions (Addendum)
Take Prednisone 61m daily for 4 days then 350mdaily for 4 days then hold at 2064maily .  Continue on [name] 2 caps Three times a day  . Take with food.  Labs today  Continue on Oxygen 5l/m at rest and 6l/m with activity .  Mycelex five times a day for 1 week  follow up Dr. RamChase Caller 6 weeks and As needed   Please contact office for sooner follow up if symptoms do not improve or worsen or seek emergency care    Tome prednisona 40 mg al da durante 4 das y luego 30 mg al [place] Oakdas y lueViacomstenga a 20 mg al da.SunTrustontinuar con [name] 2 cpsulas TreProgress Energyomar con la comida. Laboratorios hoy Contine con Oxygen 5l / m en reposo y 6l / m con actividad. Mycelex cinco veces al da durante 1 semana seguir al Dr. RamChase Caller 6 semanas y segn sea necesario [place] con la oficina para un seguimiento ms temprano si los sntomas no mejoran o empeoran o buscan atencin de emeFreight forwarder

## 2016-08-14 NOTE — Assessment & Plan Note (Signed)
Cont on o2 5l/m rest and 6lm w/ act

## 2016-08-14 NOTE — Progress Notes (Signed)
_0  ID: [name], male    DOB: 06-08-1945, 71 y.o.   MRN: [id number]  Chief Complaint  Patient presents with  . Follow-up    ILD     Referring provider: Ladell Pier, MD  HPI: 71 year old male former smoker with presumed pulmonary fibrosis seen for pulmonary consult 05/12/2016 Patient is Hispanic from [place] and does not speak English  TEST  04/2016 CT chest that was negative for PE but showed a small pneumomediastinum potentially due to rupture of a peripheral bleb. 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 ILD workup showed a negative ANA, anti-DNA antibody, rheumatoid factor, CCP, Sjogren's, scleroderma. C ANCA was mildly positive . 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 -c/w [facility] . ESR 95.   2-D echo April 2018  , EF 02-72%, grade 1 diastolic dyfuncton, PAP  56 mmhg  08/14/2016 Follow up : Post hospital follow up , [facility] Pulmonary Fibrosis -Interpretor present for exam  Patient returns for a post hospital follow-up.   Patient was admitted earlier this month or acute  on chronic  Respiratory failure.  He was treated with IV antibiotics  , and steroids. Prior to admission he was started on [name] . He remains on this at 2 tabs Three times a day  .  Hospital records reviewed. LFT were nml 2 weeks ago. Appetite is okay with no n/v/d.  He is on prednisone 26m daily , he is on tapering dose and to slowly taper to 220mdaily (on 08/23/16)  unfornuately he misunderstood and did not take prednisone but for 1 day. We discussed his correct dosing.  He remains on O2 at 5l/m rest and 6l/m with activity .  He is only slightly better since discharge, he gets sob with minimal activity . No flare of cough or wheezing .    Allergies  Allergen Reactions  . Cyclobenzaprine Swelling and Other (See Comments)    Tongue and lips swell    Immunization History  Administered Date(s) Administered  .  Influenza Split 01/24/2016  . Pneumococcal Conjugate-13 06/20/2016  . Pneumococcal Polysaccharide-23 11/05/2012    Past Medical History:  Diagnosis Date  . GERD (gastroesophageal reflux disease)   . Hyperglycemia   . Hypertension   . IPF (idiopathic pulmonary fibrosis) ([place]6/05/2016  . Pulmonary fibrosis ([facility]  . 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 Former Smoker  . Packs/day: 2.00  . Years: 40.00  . Quit date: 05/03/2001  Smokeless Tobacco  . Never Used   Counseling given: Not Answered   Outpatient Encounter Prescriptions as of 08/14/2016  Medication Sig  . acetaminophen (TYLENOL) 325 MG tablet Take 325 mg by mouth every 6 (six) hours as needed (for pain).  . Marland Kitchenlbuterol (PROVENTIL HFA;VENTOLIN HFA) 108 (90 Base) MCG/ACT inhaler Inhale 1-2 puffs into the lungs every 4 (four) hours as needed for wheezing or shortness of breath.  . Marland Kitchenlbuterol (PROVENTIL) (2.5 MG/3ML) 0.083% nebulizer solution Take 3 mLs (2.5 mg total) by nebulization every 4 (four) hours as needed for wheezing or shortness of breath.  . calcium-vitamin D (OSCAL WITH D) 500-200 MG-UNIT tablet Take 1 tablet by mouth 2 (two) times daily.  . Marland Kitcheniltiazem (CARDIZEM CD) 120 MG 24 hr capsule Take 1 capsule (120 mg total) by mouth daily.  . Glycopyrrolate-Formoterol (BEVESPI AEROSPHERE) 9-4.8 MCG/ACT AERO Inhale 2 puffs into the lungs 2 (two)  times daily.  Marland Kitchen omeprazole (PRILOSEC) 40 MG capsule Take 1 capsule (40 mg total) by mouth daily.  . Pirfenidone ([name]) 267 MG CAPS Take 534 mg by mouth 3 (three) times daily.  . predniSONE (DELTASONE) 20 MG tablet Take 2 tablets (40 mg) by mouth daily starting 7/18. Then take 1 and 1/2 tablets (61m) daily starting 7/25. Then take 1 tablet daily starting 8/1.  . clotrimazole (MYCELEX) 10 MG troche Take 1 tablet (10 mg total) by mouth 5 (five) times daily.   No facility-administered encounter  medications on file as of 08/14/2016.      Review of Systems  Constitutional:   No  weight loss, night sweats,  Fevers, chills +fatigue, or  lassitude.  HEENT:   No headaches,  Difficulty swallowing,  Tooth/dental problems, or  Sore throat,                No sneezing, itching, ear ache, nasal congestion, post nasal drip,   CV:  No chest pain,  Orthopnea, PND, swelling in lower extremities, anasarca, dizziness, palpitations, syncope.   GI  No heartburn, indigestion, abdominal pain, nausea, vomiting, diarrhea, change in bowel habits, loss of appetite, bloody stools.   Resp:   No wheezing.  No chest wall deformity  Skin: no rash or lesions.  GU: no dysuria, change in color of urine, no urgency or frequency.  No flank pain, no hematuria   MS:  No joint pain or swelling.  No decreased range of motion.  No back pain.    Physical Exam  BP 120/64 (BP Location: Left Arm, Cuff Size: Normal)   Pulse (!) 110   Ht _0  (1.626 m)   Wt 120 lb 3.2 oz (54.5 kg)   SpO2 97%   BMI 20.63 kg/m   GEN: A/Ox3; pleasant , NAD, thin frail elderly in wc on O2    HEENT:  [place]/AT,  EACs-clear, TMs-wnl, NOSE-clear, THROAT-clear, no lesions, no postnasal drip or exudate noted.   NECK:  Supple w/ fair ROM; no JVD; normal carotid impulses w/o bruits; no thyromegaly or nodules palpated; no lymphadenopathy.    RESP  BB crackles ,  no accessory muscle use, no dullness to percussion  CARD:  RRR, no m/r/g, no peripheral edema, pulses intact, no cyanosis or clubbing.  GI:   Soft & nt; nml bowel sounds; no organomegaly or masses detected.   Musco: Warm bil, no deformities or joint swelling noted.   Neuro: alert, no focal deficits noted.    Skin: Warm, no lesions or rashes    Lab Results:  CBC   BNP  ProBNP No results found for: PROBNP  Imaging: Dg Chest 2 View  Result Date: 07/15/2016 CLINICAL DATA:  Shortness of breath and chest pain EXAM: CHEST  2 VIEW COMPARISON:  Chest radiograph  06/07/2016 Chest CT 05/13/2016 FINDINGS: Diffuse reticular opacities, worse on the left, are unchanged compared to the prior radiograph. No pneumothorax or pleural effusion. No superimposed focal consolidation. Thoracolumbar compression deformity is unchanged. IMPRESSION: Unchanged appearance of advanced pulmonary fibrosis without superimposed acute abnormality. Electronically Signed   By: KUlyses JarredM.D.   On: 07/15/2016 17:16   Dg Chest Portable 1 View  Result Date: 08/01/2016 CLINICAL DATA:  Shortness of breath, productive cough and fever. History of pulmonary fibrosis. Interstitial lung disease. EXAM: PORTABLE CHEST 1 VIEW COMPARISON:  07/15/2016, 06/07/2016, 05/30/2016 and 05/03/2016 FINDINGS: The patient has severe chronic interstitial lung disease with multiple chronic areas of increased density in both lungs, and systole  unchanged since multiple prior studies. Heart size and pulmonary vascularity are normal. No acute bone abnormality. IMPRESSION: No acute abnormalities.  Severe chronic lung disease. Electronically Signed   By: Lorriane Shire M.D.   On: 08/01/2016 10:32     Assessment & Plan:   ILD (interstitial lung disease) ([place]) ILD -[facility]  Pt w/ recent flare -bronchitis -appears stable but high risk for decompensation due to high flow O2 needs and severe ILD changes.  He will need to restart steroids and begin slow taper to 33m daily.  Appears to be tolerating [name] , ,check LFT   Plan  Patient Instructions  Take Prednisone 427mdaily for 4 days then 3013maily for 4 days then hold at 77m70mily .  Continue on [name] 2 caps Three times a day  . Take with food.  Labs today  Continue on Oxygen 5l/m at rest and 6l/m with activity .  Mycelex five times a day for 1 week  follow up Dr. RamaChase Caller6 weeks and As needed   Please contact office for sooner follow up if symptoms do not improve or worsen or seek emergency care    Tome prednisona 40 mg al da durante 4 das y  luego 30 mg al dBlackshearas y luegViacomtenga a 20 mg al da. SunTrustntinuar con [name] 2 cpsulas TresProgress Energymar con la comida. Laboratorios hoy Contine con Oxygen 5l / m en reposo y 6l / m con actividad. Mycelex cinco veces al da durante 1 semana seguir al Dr. 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 And current regimen .   Chronic respiratory failure with hypoxia/ noct desats Cont on o2 5l/m rest and 6lm w/ act      TammRexene Edison 08/14/2016

## 2016-08-15 ENCOUNTER — Telehealth: Payer: Self-pay | Admitting: Internal Medicine

## 2016-08-15 ENCOUNTER — Telehealth: Payer: Self-pay | Admitting: Adult Health

## 2016-08-15 DIAGNOSIS — J84112 Idiopathic pulmonary fibrosis: Secondary | ICD-10-CM

## 2016-08-15 MED FILL — OMEPRAZOLE DR 40 MG CAPSULE: 40 | 30 days supply | Qty: 30 | Fill #1

## 2016-08-15 MED FILL — DILTIAZEM 24HR ER 120 MG CA: 120 | 30 days supply | Qty: 30 | Fill #1

## 2016-08-15 NOTE — Telephone Encounter (Signed)
LMTCB for [employer]

## 2016-08-15 NOTE — Telephone Encounter (Signed)
Left message for [name] to call back.

## 2016-08-15 NOTE — Telephone Encounter (Signed)
[name] returned call. Pt is requesting larger tanks because pt is now at a higher liter flow. Spoke with TP, she gave VO to proceed with order. Order placed, marked urgent because pt has several appts this week and will need the larger tanks to last him during appts. A staff message was also sent to [facility] to help expedite the order. [name] verbalized understanding and denied any further questions or concerns at this time.   Will keep this message open to be sure pt gets his tanks ASAP to be able to go to his other appts this week.

## 2016-08-16 ENCOUNTER — Ambulatory Visit: Payer: Self-pay

## 2016-08-16 ENCOUNTER — Ambulatory Visit: Payer: Self-pay | Attending: Family Medicine | Admitting: Family Medicine

## 2016-08-16 ENCOUNTER — Telehealth: Payer: Self-pay | Admitting: Internal Medicine

## 2016-08-16 ENCOUNTER — Encounter: Payer: Self-pay | Admitting: Family Medicine

## 2016-08-16 VITALS — BP 125/80 | HR 113 | Temp 97.6°F | Ht 64.0 in | Wt 120.4 lb

## 2016-08-16 DIAGNOSIS — Z7984 Long term (current) use of oral hypoglycemic drugs: Secondary | ICD-10-CM | POA: Insufficient documentation

## 2016-08-16 DIAGNOSIS — I10 Essential (primary) hypertension: Secondary | ICD-10-CM | POA: Insufficient documentation

## 2016-08-16 DIAGNOSIS — K219 Gastro-esophageal reflux disease without esophagitis: Secondary | ICD-10-CM | POA: Insufficient documentation

## 2016-08-16 DIAGNOSIS — E119 Type 2 diabetes mellitus without complications: Secondary | ICD-10-CM | POA: Insufficient documentation

## 2016-08-16 DIAGNOSIS — Z9981 Dependence on supplemental oxygen: Secondary | ICD-10-CM | POA: Insufficient documentation

## 2016-08-16 DIAGNOSIS — Z131 Encounter for screening for diabetes mellitus: Secondary | ICD-10-CM

## 2016-08-16 DIAGNOSIS — J849 Interstitial pulmonary disease, unspecified: Secondary | ICD-10-CM

## 2016-08-16 DIAGNOSIS — E099 Drug or chemical induced diabetes mellitus without complications: Secondary | ICD-10-CM | POA: Insufficient documentation

## 2016-08-16 DIAGNOSIS — Z888 Allergy status to other drugs, medicaments and biological substances status: Secondary | ICD-10-CM | POA: Insufficient documentation

## 2016-08-16 DIAGNOSIS — Z79899 Other long term (current) drug therapy: Secondary | ICD-10-CM | POA: Insufficient documentation

## 2016-08-16 DIAGNOSIS — T380X5A Adverse effect of glucocorticoids and synthetic analogues, initial encounter: Secondary | ICD-10-CM | POA: Insufficient documentation

## 2016-08-16 DIAGNOSIS — J84112 Idiopathic pulmonary fibrosis: Secondary | ICD-10-CM | POA: Insufficient documentation

## 2016-08-16 DIAGNOSIS — R000 Tachycardia, unspecified: Secondary | ICD-10-CM | POA: Insufficient documentation

## 2016-08-16 DIAGNOSIS — J670 Farmer's lung: Secondary | ICD-10-CM | POA: Insufficient documentation

## 2016-08-16 DIAGNOSIS — J9611 Chronic respiratory failure with hypoxia: Secondary | ICD-10-CM | POA: Insufficient documentation

## 2016-08-16 LAB — POCT GLYCOSYLATED HEMOGLOBIN (HGB A1C): Hemoglobin A1C: 6.8

## 2016-08-16 MED ORDER — METFORMIN HCL 500 MG PO TABS: 500.0000 mg | ORAL_TABLET | Freq: Two times a day (BID) | ORAL | 3 refills | Status: AC

## 2016-08-16 MED FILL — ?METFORMIN HCL 500MG TABLET: 500 | 30 days supply | Qty: 60 | Fill #0

## 2016-08-16 NOTE — Telephone Encounter (Signed)
Met with patient while he was in the office for his appointment with Dr. Jarold Song. I informed patient of the blue and green booklets for free food pantry and meals but patient and family members stated that they didn't need it. Patient and family member met with Diane and began the application for [employer]. Pt's daughter will bring in one more document to process the application. Scheduled patient a follow up appointment for 8/8 at 2pm with Dr. Jarold Song.

## 2016-08-16 NOTE — Progress Notes (Signed)
Transitionary care clinic  Hospitalization date: 08/01/16 through 08/08/16  PCP: Dr Wynetta Emery  Date of telephone encounter: 08/09/16 Subjective:  Patient ID: [name], male    DOB: 19-Sep-1945  Age: 71 y.o. MRN: [id number]  CC: Follow-up   HPI [name] is a 71 year old, Spanish-speaking male who is accompanied by his son and daughter to establish care at the transitional care clinic. 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   He was recently hospitalized for acute bronchitis flare, chest x-ray revealed chronic interstitial lung disease changes but no infiltrate. He was treated with IV steroids, antibiotics for presumptive pneumonia with slow improvement. Subsequently discharged on a prednisone taper  He was seen for follow-up visit by pulmonary 2 days ago. Referred to [facility] for consideration for lung transplant but according to family this has not been successful. He continues to be short of breath at baseline and has been compliant with all his medications.  A1c performed in the office is  6.8 which has trended up from 5.7, three months ago  Past Medical History:  Diagnosis Date  . GERD (gastroesophageal reflux disease)   . Hyperglycemia   . Hypertension   . IPF (idiopathic pulmonary fibrosis) ([place]) 06/27/2016  . Pulmonary fibrosis ([place])   . 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 EYE SURGERY    . HERNIA REPAIR    . TRANSTHORACIC ECHOCARDIOGRAM  05/15/2016   EF 60 -65%. GR 1 DD. Moderate PA pressures estimated at 55 mmHg    Allergies  Allergen Reactions  . Cyclobenzaprine Swelling and Other (See Comments)    Tongue and lips swell     Outpatient Medications Prior to  Visit  Medication Sig Dispense Refill  . acetaminophen (TYLENOL) 325 MG tablet Take 325 mg by mouth every 6 (six) hours as needed (for pain).    Marland Kitchen albuterol (PROVENTIL HFA;VENTOLIN HFA) 108 (90 Base) MCG/ACT inhaler Inhale 1-2 puffs into the lungs every 4 (four) hours as needed for wheezing or shortness of breath. 1 Inhaler 2  . albuterol (PROVENTIL) (2.5 MG/3ML) 0.083% nebulizer solution Take 3 mLs (2.5 mg total) by nebulization every 4 (four) hours as needed for wheezing or shortness of breath. 75 mL 0  . calcium-vitamin D (OSCAL WITH D) 500-200 MG-UNIT tablet Take 1 tablet by mouth 2 (two) times daily. 60 tablet 3  . clotrimazole (MYCELEX) 10 MG troche Take 1 tablet (10 mg total) by mouth 5 (five) times daily. 35 tablet 0  . diltiazem (CARDIZEM CD) 120 MG 24 hr capsule Take 1 capsule (120 mg total) by mouth daily. 30 capsule 6  . Glycopyrrolate-Formoterol (BEVESPI AEROSPHERE) 9-4.8 MCG/ACT AERO Inhale 2 puffs into the lungs 2 (two) times daily. 1 Inhaler 5  . omeprazole (PRILOSEC) 40 MG capsule Take 1 capsule (40 mg total) by mouth daily. 30 capsule 6  . Pirfenidone ([name]) 267 MG CAPS Take 534 mg by mouth 3 (three) times daily.    . predniSONE (DELTASONE) 20 MG tablet Take 2 tablets (40 mg) by mouth daily starting 7/18. Then take 1 and 1/2 tablets (62m) daily starting 7/25. Then take 1 tablet daily starting 8/1. 60 tablet 0   No facility-administered medications prior to visit.     ROS Review of Systems  Constitutional: Negative for activity  change and appetite change.  HENT: Negative for sinus pressure and sore throat.   Eyes: Negative for visual disturbance.  Respiratory: Positive for cough and shortness of breath. Negative for chest tightness.   Cardiovascular: Negative for chest pain and leg swelling.  Gastrointestinal: Negative for abdominal distention, abdominal pain, constipation and diarrhea.  Endocrine: Negative.   Genitourinary: Negative for dysuria.  Musculoskeletal:  Negative for joint swelling and myalgias.  Skin: Negative for rash.  Allergic/Immunologic: Negative.   Neurological: Negative for weakness, light-headedness and numbness.  Psychiatric/Behavioral: Negative for dysphoric mood and suicidal ideas.    Objective:  BP 125/80   Pulse (!) 113   Temp 97.6 F (36.4 C) (Oral)   Ht _0  (1.626 m)   Wt 120 lb 6.4 oz (54.6 kg)   SpO2 97%   BMI 20.67 kg/m   BP/Weight 08/16/2016 08/14/2016 1/44/4584  Systolic BP 835 075 732  Diastolic BP 80 64 72  Wt. (Lbs) 120.4 120.2 -  BMI 20.67 20.63 -      Physical Exam  Constitutional: He is oriented to person, place, and time. He appears well-developed and well-nourished.  HENT:  On 5 L of oxygen via nasal cannula  Cardiovascular: Normal heart sounds and intact distal pulses.  Tachycardia present.   No murmur heard. Pulmonary/Chest: Effort normal and breath sounds normal. He has no wheezes. He has no rales. He exhibits no tenderness.  Abdominal: Soft. Bowel sounds are normal. He exhibits no distension and no mass. There is no tenderness.  Musculoskeletal: Normal range of motion.  Neurological: He is alert and oriented to person, place, and time.  Skin: Skin is warm and dry.  Psychiatric: He has a normal mood and affect.     Assessment & Plan:   1. Interstitial lung disease Interstitial lung disease secondary to [facility] Continue prednisone-taking 20 mg twice daily for 1 week after which he will switch to 20 mg daily as per pulmonary instruction Continue [name], Bevespi, Proventil Keep appointment with pulmonary  2. Sinus tachycardia Stable Currently on Cardizem  3. Chronic respiratory failure with hypoxia/ noct desats Remains on 5 L of oxygen at rest  4. Type 2 diabetes mellitus without complication, without long-term current use of insulin ([place]) Newly diagnosed with A1c of 6.8 Steroid-induced Commence metformin Diabetic teaching regarding my plate method - POCT glycosylated  hemoglobin (Hb A1C)    Meds ordered this encounter  Medications  . metFORMIN (GLUCOPHAGE) 500 MG tablet    Sig: Take 1 tablet (500 mg total) by mouth 2 (two) times daily with a meal.    Dispense:  60 tablet    Refill:  3    Follow-up: Return in about 2 weeks (around 08/30/2016) for TCC - follow up of chronic medical conditions.   Arnoldo Morale MD

## 2016-08-16 NOTE — Telephone Encounter (Signed)
LMTCB

## 2016-08-17 NOTE — Telephone Encounter (Signed)
[name] with [facility] is returning phone call [phone] Ext: [phone]. Pulled order Tammy written for new liter flow increase.

## 2016-08-17 NOTE — Telephone Encounter (Signed)
lmom tcb x1 to [place], does he need [place] to change the order we placed on 08/15/16

## 2016-08-17 NOTE — Telephone Encounter (Signed)
Spoke with Corene Cornea, who saw previous order and they are taking care of the patient. He states he does not have any additional questions at this time. Nothing further is needed

## 2016-08-17 NOTE — Telephone Encounter (Signed)
[name] responded to the staff message on 7.24.18 indicating they would get the pt his tanks ASAP. Will sign off.

## 2016-08-21 ENCOUNTER — Emergency Department (HOSPITAL_COMMUNITY): Payer: Medicaid Other

## 2016-08-21 DIAGNOSIS — J84112 Idiopathic pulmonary fibrosis: Principal | ICD-10-CM | POA: Diagnosis present

## 2016-08-21 DIAGNOSIS — Z87891 Personal history of nicotine dependence: Secondary | ICD-10-CM

## 2016-08-21 DIAGNOSIS — I272 Pulmonary hypertension, unspecified: Secondary | ICD-10-CM | POA: Diagnosis present

## 2016-08-21 DIAGNOSIS — Y95 Nosocomial condition: Secondary | ICD-10-CM | POA: Diagnosis not present

## 2016-08-21 DIAGNOSIS — E119 Type 2 diabetes mellitus without complications: Secondary | ICD-10-CM | POA: Diagnosis present

## 2016-08-21 DIAGNOSIS — Z66 Do not resuscitate: Secondary | ICD-10-CM | POA: Diagnosis not present

## 2016-08-21 DIAGNOSIS — R64 Cachexia: Secondary | ICD-10-CM | POA: Diagnosis present

## 2016-08-21 DIAGNOSIS — J670 Farmer's lung: Secondary | ICD-10-CM | POA: Diagnosis present

## 2016-08-21 DIAGNOSIS — Z8249 Family history of ischemic heart disease and other diseases of the circulatory system: Secondary | ICD-10-CM

## 2016-08-21 DIAGNOSIS — B379 Candidiasis, unspecified: Secondary | ICD-10-CM | POA: Diagnosis present

## 2016-08-21 DIAGNOSIS — R000 Tachycardia, unspecified: Secondary | ICD-10-CM | POA: Diagnosis present

## 2016-08-21 DIAGNOSIS — J9621 Acute and chronic respiratory failure with hypoxia: Secondary | ICD-10-CM | POA: Diagnosis present

## 2016-08-21 DIAGNOSIS — F419 Anxiety disorder, unspecified: Secondary | ICD-10-CM | POA: Diagnosis present

## 2016-08-21 DIAGNOSIS — Z9981 Dependence on supplemental oxygen: Secondary | ICD-10-CM

## 2016-08-21 DIAGNOSIS — I10 Essential (primary) hypertension: Secondary | ICD-10-CM | POA: Diagnosis present

## 2016-08-21 DIAGNOSIS — K219 Gastro-esophageal reflux disease without esophagitis: Secondary | ICD-10-CM | POA: Diagnosis present

## 2016-08-21 DIAGNOSIS — Z6820 Body mass index (BMI) 20.0-20.9, adult: Secondary | ICD-10-CM

## 2016-08-21 DIAGNOSIS — J189 Pneumonia, unspecified organism: Secondary | ICD-10-CM | POA: Diagnosis not present

## 2016-08-21 DIAGNOSIS — E440 Moderate protein-calorie malnutrition: Secondary | ICD-10-CM | POA: Diagnosis present

## 2016-08-21 LAB — BASIC METABOLIC PANEL
Anion gap: 7 (ref 5–15)
BUN: 23 mg/dL — AB (ref 6–20)
CHLORIDE: 95 mmol/L — AB (ref 101–111)
CO2: 33 mmol/L — AB (ref 22–32)
Calcium: 8.9 mg/dL (ref 8.9–10.3)
Creatinine, Ser: 1.1 mg/dL (ref 0.61–1.24)
GFR calc Af Amer: >60 mL/min (ref >60)
GFR calc non Af Amer: >60 mL/min (ref >60)
Glucose, Bld: 159 mg/dL — ABNORMAL HIGH (ref 65–99)
POTASSIUM: 4.1 mmol/L (ref 3.5–5.1)
SODIUM: 135 mmol/L (ref 135–145)

## 2016-08-21 LAB — CBC
HEMATOCRIT: 44.7 % (ref 39.0–52.0)
Hemoglobin: 14.7 g/dL (ref 13.0–17.0)
MCH: 30.1 pg (ref 26.0–34.0)
MCHC: 32.9 g/dL (ref 30.0–36.0)
MCV: 91.4 fL (ref 78.0–100.0)
Platelets: 273 10*3/uL (ref 150–400)
RBC: 4.89 MIL/uL (ref 4.22–5.81)
RDW: 15.3 % (ref 11.5–15.5)
WBC: 16.8 10*3/uL — AB (ref 4.0–10.5)

## 2016-08-21 LAB — I-STAT TROPONIN, ED: Troponin i, poc: 0 ng/mL (ref 0.00–0.08)

## 2016-08-21 LAB — BRAIN NATRIURETIC PEPTIDE: B NATRIURETIC PEPTIDE 5: 30.2 pg/mL (ref 0.0–100.0)

## 2016-08-21 NOTE — ED Triage Notes (Signed)
Patient reports worsening SOB with chest tightness , productive cough and palpitations onset this week , denies fever or chills . He uses an oxygen 4 lpm/[place] at home .

## 2016-08-22 ENCOUNTER — Inpatient Hospital Stay (HOSPITAL_COMMUNITY)
Admission: EM | Admit: 2016-08-22 | Discharge: 2016-09-23 | DRG: 196 | Disposition: E | Payer: Medicaid Other | Attending: Internal Medicine | Admitting: Internal Medicine

## 2016-08-22 ENCOUNTER — Encounter (HOSPITAL_COMMUNITY): Payer: Self-pay | Admitting: Emergency Medicine

## 2016-08-22 DIAGNOSIS — Z8249 Family history of ischemic heart disease and other diseases of the circulatory system: Secondary | ICD-10-CM | POA: Diagnosis not present

## 2016-08-22 DIAGNOSIS — I272 Pulmonary hypertension, unspecified: Secondary | ICD-10-CM | POA: Diagnosis present

## 2016-08-22 DIAGNOSIS — J9621 Acute and chronic respiratory failure with hypoxia: Secondary | ICD-10-CM

## 2016-08-22 DIAGNOSIS — I10 Essential (primary) hypertension: Secondary | ICD-10-CM | POA: Diagnosis present

## 2016-08-22 DIAGNOSIS — J670 Farmer's lung: Secondary | ICD-10-CM | POA: Diagnosis present

## 2016-08-22 DIAGNOSIS — R0602 Shortness of breath: Secondary | ICD-10-CM

## 2016-08-22 DIAGNOSIS — R64 Cachexia: Secondary | ICD-10-CM | POA: Diagnosis present

## 2016-08-22 DIAGNOSIS — J84112 Idiopathic pulmonary fibrosis: Secondary | ICD-10-CM | POA: Diagnosis present

## 2016-08-22 DIAGNOSIS — F419 Anxiety disorder, unspecified: Secondary | ICD-10-CM | POA: Diagnosis present

## 2016-08-22 DIAGNOSIS — E119 Type 2 diabetes mellitus without complications: Secondary | ICD-10-CM

## 2016-08-22 DIAGNOSIS — J189 Pneumonia, unspecified organism: Secondary | ICD-10-CM | POA: Diagnosis not present

## 2016-08-22 DIAGNOSIS — B379 Candidiasis, unspecified: Secondary | ICD-10-CM | POA: Diagnosis present

## 2016-08-22 DIAGNOSIS — R000 Tachycardia, unspecified: Secondary | ICD-10-CM | POA: Diagnosis present

## 2016-08-22 DIAGNOSIS — E440 Moderate protein-calorie malnutrition: Secondary | ICD-10-CM | POA: Diagnosis present

## 2016-08-22 DIAGNOSIS — K219 Gastro-esophageal reflux disease without esophagitis: Secondary | ICD-10-CM | POA: Diagnosis present

## 2016-08-22 DIAGNOSIS — R509 Fever, unspecified: Secondary | ICD-10-CM

## 2016-08-22 DIAGNOSIS — Z6820 Body mass index (BMI) 20.0-20.9, adult: Secondary | ICD-10-CM | POA: Diagnosis not present

## 2016-08-22 DIAGNOSIS — Z87891 Personal history of nicotine dependence: Secondary | ICD-10-CM | POA: Diagnosis not present

## 2016-08-22 DIAGNOSIS — Z66 Do not resuscitate: Secondary | ICD-10-CM | POA: Diagnosis not present

## 2016-08-22 DIAGNOSIS — J9600 Acute respiratory failure, unspecified whether with hypoxia or hypercapnia: Secondary | ICD-10-CM | POA: Diagnosis present

## 2016-08-22 DIAGNOSIS — J849 Interstitial pulmonary disease, unspecified: Secondary | ICD-10-CM

## 2016-08-22 DIAGNOSIS — Y95 Nosocomial condition: Secondary | ICD-10-CM | POA: Diagnosis not present

## 2016-08-22 DIAGNOSIS — Z9981 Dependence on supplemental oxygen: Secondary | ICD-10-CM | POA: Diagnosis not present

## 2016-08-22 DIAGNOSIS — J9620 Acute and chronic respiratory failure, unspecified whether with hypoxia or hypercapnia: Secondary | ICD-10-CM | POA: Diagnosis present

## 2016-08-22 DIAGNOSIS — Z515 Encounter for palliative care: Secondary | ICD-10-CM

## 2016-08-22 LAB — RESPIRATORY PANEL BY PCR
ADENOVIRUS-RVPPCR: NOT DETECTED
Bordetella pertussis: NOT DETECTED
CHLAMYDOPHILA PNEUMONIAE-RVPPCR: NOT DETECTED
CORONAVIRUS NL63-RVPPCR: NOT DETECTED
Coronavirus 229E: NOT DETECTED
Coronavirus HKU1: NOT DETECTED
Coronavirus OC43: NOT DETECTED
INFLUENZA A-RVPPCR: NOT DETECTED
Influenza B: NOT DETECTED
METAPNEUMOVIRUS-RVPPCR: NOT DETECTED
Mycoplasma pneumoniae: NOT DETECTED
PARAINFLUENZA VIRUS 2-RVPPCR: NOT DETECTED
PARAINFLUENZA VIRUS 3-RVPPCR: NOT DETECTED
PARAINFLUENZA VIRUS 4-RVPPCR: NOT DETECTED
Parainfluenza Virus 1: NOT DETECTED
RHINOVIRUS / ENTEROVIRUS - RVPPCR: NOT DETECTED
Respiratory Syncytial Virus: NOT DETECTED

## 2016-08-22 LAB — CBC WITH DIFFERENTIAL/PLATELET
Basophils Absolute: 0 10*3/uL (ref 0.0–0.1)
Basophils Relative: 0 %
EOS ABS: 0.1 10*3/uL (ref 0.0–0.7)
EOS PCT: 0 %
HCT: 42 % (ref 39.0–52.0)
HEMOGLOBIN: 14.1 g/dL (ref 13.0–17.0)
LYMPHS ABS: 1.4 10*3/uL (ref 0.7–4.0)
Lymphocytes Relative: 8 %
MCH: 30.6 pg (ref 26.0–34.0)
MCHC: 33.6 g/dL (ref 30.0–36.0)
MCV: 91.1 fL (ref 78.0–100.0)
MONO ABS: 1.2 10*3/uL — AB (ref 0.1–1.0)
MONOS PCT: 7 %
Neutro Abs: 14.1 10*3/uL — ABNORMAL HIGH (ref 1.7–7.7)
Neutrophils Relative %: 85 %
Platelets: 238 10*3/uL (ref 150–400)
RBC: 4.61 MIL/uL (ref 4.22–5.81)
RDW: 15.3 % (ref 11.5–15.5)
WBC: 16.8 10*3/uL — ABNORMAL HIGH (ref 4.0–10.5)

## 2016-08-22 LAB — GLUCOSE, CAPILLARY
GLUCOSE-CAPILLARY: 138 mg/dL — AB (ref 65–99)
GLUCOSE-CAPILLARY: 135 mg/dL — AB (ref 65–99)
GLUCOSE-CAPILLARY: 345 mg/dL — AB (ref 65–99)
Glucose-Capillary: 99 mg/dL (ref 65–99)

## 2016-08-22 LAB — BASIC METABOLIC PANEL
Anion gap: 8 (ref 5–15)
BUN: 21 mg/dL — AB (ref 6–20)
CHLORIDE: 97 mmol/L — AB (ref 101–111)
CO2: 34 mmol/L — AB (ref 22–32)
CREATININE: 0.91 mg/dL (ref 0.61–1.24)
Calcium: 8.8 mg/dL — ABNORMAL LOW (ref 8.9–10.3)
GFR calc Af Amer: >60 mL/min (ref >60)
GFR calc non Af Amer: >60 mL/min (ref >60)
GLUCOSE: 134 mg/dL — AB (ref 65–99)
Potassium: 3.9 mmol/L (ref 3.5–5.1)
Sodium: 139 mmol/L (ref 135–145)

## 2016-08-22 LAB — SEDIMENTATION RATE: Sed Rate: 21 mm/hr — ABNORMAL HIGH (ref 0–16)

## 2016-08-22 LAB — C-REACTIVE PROTEIN: CRP: 2.1 mg/dL — ABNORMAL HIGH (ref <1.0)

## 2016-08-22 LAB — STREP PNEUMONIAE URINARY ANTIGEN: STREP PNEUMO URINARY ANTIGEN: NEGATIVE

## 2016-08-22 LAB — PROCALCITONIN: Procalcitonin: <0.1 ng/mL

## 2016-08-22 MED ORDER — IPRATROPIUM BROMIDE 0.02 % IN SOLN
0.5000 mg | Freq: Once | RESPIRATORY_TRACT | Status: AC
  Administered 2016-08-22: 0.5 mg via RESPIRATORY_TRACT
  Filled 2016-08-22: qty 2.5

## 2016-08-22 MED ORDER — DOXYCYCLINE HYCLATE 100 MG IV SOLR
100.0000 mg | Freq: Two times a day (BID) | INTRAVENOUS | Status: DC
  Administered 2016-08-22 – 2016-08-23 (×2): 100 mg via INTRAVENOUS
  Filled 2016-08-22 (×4): qty 100

## 2016-08-22 MED ORDER — PIRFENIDONE 267 MG PO TABS
534.0000 mg | ORAL_TABLET | Freq: Three times a day (TID) | ORAL | Status: DC
  Administered 2016-08-22 – 2016-09-02 (×29): 534 mg via ORAL
  Filled 2016-08-22 (×37): qty 2

## 2016-08-22 MED ORDER — ENOXAPARIN SODIUM 40 MG/0.4ML ~~LOC~~ SOLN
40.0000 mg | SUBCUTANEOUS | Status: DC
  Administered 2016-08-22 – 2016-09-02 (×12): 40 mg via SUBCUTANEOUS
  Filled 2016-08-22 (×9): qty 0.4

## 2016-08-22 MED ORDER — ONDANSETRON HCL 4 MG PO TABS: 4.0000 mg | ORAL_TABLET | Freq: Four times a day (QID) | ORAL | Status: DC | PRN

## 2016-08-22 MED ORDER — INSULIN ASPART 100 UNIT/ML ~~LOC~~ SOLN
0.0000 [IU] | Freq: Three times a day (TID) | SUBCUTANEOUS | Status: DC
  Administered 2016-08-22 – 2016-08-23 (×3): 1 [IU] via SUBCUTANEOUS
  Administered 2016-08-23 (×2): 2 [IU] via SUBCUTANEOUS
  Administered 2016-08-24: 5 [IU] via SUBCUTANEOUS
  Administered 2016-08-24 (×2): 2 [IU] via SUBCUTANEOUS
  Administered 2016-08-25 (×2): 3 [IU] via SUBCUTANEOUS
  Administered 2016-08-25 – 2016-08-26 (×2): 2 [IU] via SUBCUTANEOUS
  Administered 2016-08-26: 1 [IU] via SUBCUTANEOUS
  Administered 2016-08-26: 3 [IU] via SUBCUTANEOUS
  Administered 2016-08-27: 7 [IU] via SUBCUTANEOUS
  Administered 2016-08-27: 2 [IU] via SUBCUTANEOUS
  Administered 2016-08-27 – 2016-08-28 (×2): 3 [IU] via SUBCUTANEOUS
  Administered 2016-08-28: 5 [IU] via SUBCUTANEOUS
  Administered 2016-08-28: 7 [IU] via SUBCUTANEOUS
  Administered 2016-08-29: 3 [IU] via SUBCUTANEOUS
  Administered 2016-08-29: 5 [IU] via SUBCUTANEOUS
  Administered 2016-08-29: 3 [IU] via SUBCUTANEOUS
  Administered 2016-08-30: 7 [IU] via SUBCUTANEOUS
  Administered 2016-08-30: 5 [IU] via SUBCUTANEOUS
  Administered 2016-08-30: 3 [IU] via SUBCUTANEOUS
  Administered 2016-08-31: 1 [IU] via SUBCUTANEOUS
  Administered 2016-08-31: 2 [IU] via SUBCUTANEOUS
  Administered 2016-09-01: 5 [IU] via SUBCUTANEOUS
  Administered 2016-09-01: 3 [IU] via SUBCUTANEOUS
  Administered 2016-09-01: 2 [IU] via SUBCUTANEOUS
  Administered 2016-09-02: 5 [IU] via SUBCUTANEOUS
  Administered 2016-09-02: 2 [IU] via SUBCUTANEOUS
  Administered 2016-09-02: 3 [IU] via SUBCUTANEOUS
  Administered 2016-09-03: 5 [IU] via SUBCUTANEOUS
  Administered 2016-09-03 (×2): 2 [IU] via SUBCUTANEOUS
  Administered 2016-09-04: 3 [IU] via SUBCUTANEOUS
  Administered 2016-09-04 (×2): 2 [IU] via SUBCUTANEOUS

## 2016-08-22 MED ORDER — ALBUTEROL SULFATE (2.5 MG/3ML) 0.083% IN NEBU
5.0000 mg | INHALATION_SOLUTION | Freq: Once | RESPIRATORY_TRACT | Status: AC
  Administered 2016-08-22: 5 mg via RESPIRATORY_TRACT
  Filled 2016-08-22: qty 6

## 2016-08-22 MED ORDER — PANTOPRAZOLE SODIUM 40 MG PO TBEC
40.0000 mg | DELAYED_RELEASE_TABLET | Freq: Every day | ORAL | Status: DC
  Administered 2016-08-22 – 2016-08-30 (×9): 40 mg via ORAL
  Filled 2016-08-22 (×9): qty 1

## 2016-08-22 MED ORDER — ACETAMINOPHEN 325 MG PO TABS: 650.0000 mg | ORAL_TABLET | Freq: Four times a day (QID) | ORAL | Status: DC | PRN

## 2016-08-22 MED ORDER — METHYLPREDNISOLONE SODIUM SUCC 40 MG IJ SOLR
40.0000 mg | Freq: Two times a day (BID) | INTRAMUSCULAR | Status: DC
  Administered 2016-08-22 – 2016-08-24 (×4): 40 mg via INTRAVENOUS
  Filled 2016-08-22 (×4): qty 1

## 2016-08-22 MED ORDER — ONDANSETRON HCL 4 MG/2ML IJ SOLN: 4.0000 mg | Freq: Four times a day (QID) | INTRAMUSCULAR | Status: DC | PRN

## 2016-08-22 MED ORDER — ALBUTEROL SULFATE (2.5 MG/3ML) 0.083% IN NEBU
2.5000 mg | INHALATION_SOLUTION | RESPIRATORY_TRACT | Status: DC
  Administered 2016-08-22 – 2016-08-23 (×9): 2.5 mg via RESPIRATORY_TRACT
  Filled 2016-08-22 (×9): qty 3

## 2016-08-22 MED ORDER — PIRFENIDONE 267 MG PO CAPS
534.0000 mg | ORAL_CAPSULE | Freq: Three times a day (TID) | ORAL | Status: DC
  Filled 2016-08-22: qty 2

## 2016-08-22 MED ORDER — DILTIAZEM HCL ER COATED BEADS 120 MG PO CP24
120.0000 mg | ORAL_CAPSULE | Freq: Every day | ORAL | Status: DC
  Administered 2016-08-22 – 2016-08-30 (×9): 120 mg via ORAL
  Filled 2016-08-22 (×9): qty 1

## 2016-08-22 MED ORDER — CALCIUM CARBONATE-VITAMIN D 500-200 MG-UNIT PO TABS
1.0000 | ORAL_TABLET | Freq: Two times a day (BID) | ORAL | Status: DC
  Administered 2016-08-22 – 2016-09-01 (×19): 1 via ORAL
  Filled 2016-08-22 (×20): qty 1

## 2016-08-22 MED ORDER — ALBUTEROL SULFATE (2.5 MG/3ML) 0.083% IN NEBU
2.5000 mg | INHALATION_SOLUTION | RESPIRATORY_TRACT | Status: DC | PRN
  Administered 2016-08-24: 2.5 mg via RESPIRATORY_TRACT
  Filled 2016-08-22 (×2): qty 3

## 2016-08-22 MED ORDER — METHYLPREDNISOLONE SODIUM SUCC 40 MG IJ SOLR
40.0000 mg | Freq: Once | INTRAMUSCULAR | Status: DC
  Filled 2016-08-22: qty 1

## 2016-08-22 MED ORDER — BUDESONIDE 0.25 MG/2ML IN SUSP
0.2500 mg | Freq: Two times a day (BID) | RESPIRATORY_TRACT | Status: DC
  Administered 2016-08-22 – 2016-08-30 (×18): 0.25 mg via RESPIRATORY_TRACT
  Filled 2016-08-22 (×19): qty 2

## 2016-08-22 MED ORDER — ACETAMINOPHEN 650 MG RE SUPP: 650.0000 mg | Freq: Four times a day (QID) | RECTAL | Status: DC | PRN

## 2016-08-22 NOTE — Consult Note (Signed)
Name: [name] MRN: [id number] DOB: August 24, 1945    ADMISSION DATE:  08/20/2016 CONSULTATION DATE:  07/29/2016   REFERRING MD :  Dr. Hal Hope   CHIEF COMPLAINT:  Hypoxia   HISTORY OF PRESENT ILLNESS:   71 year old male former smoker with PMH of IPF (followed by Dr. 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 Since discharge from hospital on 7/17 has required 5L [place] and 6L with activity.   Recent admission 7/10-7/17 for bronchitis vs PNA, treated with IV steroids and empiric antibiotics.   SIGNIFICANT EVENTS  7/30 > Presents to ED   STUDIES:  ECHO 05/15/16 > EF 60-65, G1DD, PA 56  CXR 7/30 > Stable marked prominence of the interstitial markings with diffuse honeycombing, cardiac silhouette remains borderline enlarged. Bi-apical pleural thickening is unchanged. Lower thoracic and upper lumbar spine degenerative changes. L1 vertebral compression deformity.    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  has a past surgical history that includes Hernia repair; Eye surgery; and transthoracic echocardiogram (05/15/2016). Prior to Admission medications   Medication Sig Start Date End Date Taking? Authorizing Provider  acetaminophen (TYLENOL) 325 MG tablet Take 325 mg by mouth every 6 (six) hours as needed (for pain).   Yes [provider]  albuterol (PROVENTIL HFA;VENTOLIN HFA) 108 (90 Base) MCG/ACT inhaler Inhale  1-2 puffs into the lungs every 4 (four) hours as needed for wheezing or shortness of breath. 07/15/16  Yes Shary Decamp, PA-C  albuterol (PROVENTIL) (2.5 MG/3ML) 0.083% nebulizer solution Take 3 mLs (2.5 mg total) by nebulization every 4 (four) hours as needed for wheezing or shortness of breath. 07/15/16  Yes Shary Decamp, PA-C  calcium-vitamin D (OSCAL WITH D) 500-200 MG-UNIT tablet Take 1 tablet by mouth 2 (two) times daily. 06/20/16  Yes Ladell Pier, MD  clotrimazole (MYCELEX) 10 MG troche Take 1 tablet (10 mg total) by mouth 5 (five) times daily. 08/14/16  Yes Parrett, Tammy S, NP  diltiazem (CARDIZEM CD) 120 MG 24 hr capsule Take 1 capsule (120 mg total) by mouth daily. 07/13/16 10/11/16 Yes Leonie Man, MD  Glycopyrrolate-Formoterol (BEVESPI AEROSPHERE) 9-4.8 MCG/ACT AERO Inhale 2 puffs into the lungs 2 (two) times daily. 07/21/16  Yes Brand Males, MD  metFORMIN (GLUCOPHAGE) 500 MG tablet Take 1 tablet (500 mg total) by mouth 2 (two) times daily with a meal. 08/16/16  Yes Amao, Enobong, MD  omeprazole (PRILOSEC) 40 MG capsule Take 1 capsule (40 mg total) by mouth daily. 06/20/16 06/20/17 Yes Ladell Pier, MD  Pirfenidone ([name]) 267 MG CAPS Take 534 mg by mouth 3 (three) times daily.   Yes [provider]  predniSONE (DELTASONE) 20 MG tablet Take 2 tablets (40 mg) by mouth daily starting 7/18. Then take 1 and 1/2 tablets (18m) daily starting 7/25. Then take 1 tablet daily starting 8/1. Patient taking differently: Take 30 mg by mouth daily with breakfast.  08/08/16  Yes HCorey Harold NP   Allergies  Allergen Reactions  . Cyclobenzaprine Swelling and Other (See Comments)  Tongue and lips swell    FAMILY HISTORY:  family history includes Heart attack in his mother. SOCIAL HISTORY:  reports that he quit smoking about 15 years ago. He has a 80.00 pack-year smoking history. He has never used smokeless tobacco. He reports that he does not drink alcohol or use  drugs.  REVIEW OF SYSTEMS:   All negative; except for those that are bolded, which indicate positives.  Constitutional: weight loss, weight gain, night sweats, fevers, chills, fatigue, weakness.  HEENT: headaches, sore throat, sneezing, nasal congestion, post nasal drip, difficulty swallowing, tooth/dental problems, visual complaints, visual changes, ear aches. Neuro: difficulty with speech, weakness, numbness, ataxia. CV:  chest pain, orthopnea, PND, swelling in lower extremities, dizziness, palpitations, syncope.  Resp: cough, hemoptysis, dyspnea, wheezing. GI: heartburn, indigestion, abdominal pain, nausea, vomiting, diarrhea, constipation, change in bowel habits, loss of appetite, hematemesis, melena, hematochezia.  GU: dysuria, change in color of urine, urgency or frequency, flank pain, hematuria. MSK: joint pain or swelling, decreased range of motion. Psych: change in mood or affect, depression, anxiety, suicidal ideations, homicidal ideations. Skin: rash, itching, bruising.  SUBJECTIVE:   VITAL SIGNS: Temp:  [97.5 F (36.4 C)] 97.5 F (36.4 C) (07/30 2057) Pulse Rate:  [75-116] 75 (07/31 0600) Resp:  [22-41] 32 (07/31 0600) BP: (121-158)/(72-90) 127/77 (07/31 0600) SpO2:  [70 %-99 %] 95 % (07/31 0600)  PHYSICAL EXAMINATION: General:  Adult male, mild respiratory distress  Neuro:  Alert, oriented, follows commands  HEENT:  Normocephalic  Cardiovascular:  RRR, no MRG Lungs:  Diminished coarse breath sounds, no wheeze/crackles  Abdomen:  Non-tender, active bowel sounds  Musculoskeletal:  -edema  Skin:  Warm, dry, intact    Recent Labs Lab 08/21/16 2101  NA 135  K 4.1  CL 95*  CO2 33*  BUN 23*  CREATININE 1.10  GLUCOSE 159*    Recent Labs Lab 08/21/16 2101  HGB 14.7  HCT 44.7  WBC 16.8*  PLT 273   Dg Chest 2 View  Result Date: 08/21/2016 CLINICAL DATA:  Worsening shortness of breath with chest tightness, productive cough and palpitations. EXAM: CHEST  2  VIEW COMPARISON:  None. FINDINGS: Stable marked prominence of the interstitial markings with diffuse honeycombing. The cardiac silhouette remains borderline enlarged. Biapical pleural thickening is unchanged. Lower thoracic and upper lumbar spine degenerative changes. L1 vertebral compression deformity without significant change since 05/13/2016. No acute fractures. IMPRESSION: No acute abnormality. Stable marked chronic interstitial lung disease. Electronically Signed   By: Claudie Revering M.D.   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

## 2016-08-22 NOTE — Progress Notes (Signed)
PROGRESS NOTE   [name]  KDX:[id number]    DOB: Aug 18, 1945    DOA: 08/02/2016  PCP: Ladell Pier, MD   I have briefly reviewed patients previous medical records in [facility].  Brief Narrative:  71 year old Spanish-speaking male with PMH of pulmonary fibrosis/ILD (Dr. Chase Caller, Pulmonology), previous serum testing positive for [facility] and could be consistent with farmer[facility] given patient's history of exposure, being treated with [name], is undergoing lung transplant evaluation at [facility], pulmonary hypertension, DM 2, HTN, hospitalized 7/10-7/17 with acute on chronic hypoxic respiratory failure when he had productive cough, pleurisy and exposure to sick contacts. He had been treated with steroid taper and broad-spectrum antibiotics. At recent discharge, he was on oxygen 4 L/m at rest and 6 L/m with activity. He now presented with 2 days history of worsening dyspnea, cough productive of yellow sputum, chest pain only on coughing, no fevers or chills. Pulmonology consulted.   Assessment & Plan:   Principal Problem:   Acute on chronic respiratory failure (HCC) Active Problems:   HTN (hypertension)   [facility] (HCC)   IPF (idiopathic pulmonary fibrosis) (HCC)   Diabetes mellitus ([place])   Acute respiratory failure ([place])   1. Acute on chronic hypoxic respiratory failure: Secondary to ILD flare versus disease progression. Recently hospitalized when he was treated with steroid taper and broad-spectrum IV antibiotics. Pulmonology consultation appreciated: Maintain oxygen saturations >90, sputum culture, PCP, scheduled and when necessary albuterol, continue doxycycline. 2. 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 Continue home [name]. 3. Essential hypertension: Controlled. Continue diltiazem. 4. Type II DM: Monitor closely while on steroids.  SSI. 5. Leukocytosis: Likely related to ongoing steroid use.    DVT prophylaxis: Lovenox Code Status: Full Family Communication: Discussed with son at bedside. Disposition: DC home when medically improved.   Consultants:  Pulmonology   Procedures:  None  Antimicrobials:  Doxycycline    Subjective: Interviewed and examined this morning using a video interpreter. Patient reports no significant improvement since arrival. Ongoing productive cough with yellow sputum, dyspnea on minimal exertion and anterior chest pain only on coughing. Denies fever or chills. Denies any other complaints.   ROS: Denies.  Objective:  Vitals:   08/08/2016 0638 07/29/2016 0742 07/25/2016 1434 07/23/2016 1435  BP: 137/83  112/68   Pulse: 87  100 98  Resp: (!) 26  20   Temp: 97.7 F (36.5 C)  97.9 F (36.6 C)   TempSrc: Oral  Oral   SpO2: 92% 94% (!) 89% 97%  Weight: 55.1 kg (121 lb 6.4 oz)     Height: 5' 4.8" (1.646 m)       Examination:  General exam: Pleasant middle-aged male, moderately built and nourished, sitting up comfortably in bed. Respiratory system: Reduced breath sounds bilaterally with scattered bilateral coarse Velcro-like crackles. No increased work of breathing. Cardiovascular system: S1 & S2 heard, RRR. No JVD, murmurs, rubs, gallops or clicks. No pedal edema. Telemetry: Sinus rhythm.  Gastrointestinal system: Abdomen is nondistended, soft and nontender. No organomegaly or masses felt. Normal bowel sounds heard. Central nervous system: Alert and oriented. No focal neurological deficits. Extremities: Symmetric 5 x 5 power. Skin: No rashes, lesions or ulcers Psychiatry: Judgement and insight appear normal. Mood & affect appropriate.     Data Reviewed: I have personally reviewed following labs and imaging studies  CBC:  Recent Labs Lab 08/21/16 2101 07/24/2016 0637  WBC 16.8* 16.8*  NEUTROABS  --  14.1*  HGB 14.7 14.1  HCT 44.7 42.0  MCV 91.4 91.1  PLT 273 578   Basic  Metabolic Panel:  Recent Labs Lab 08/21/16 2101 08/20/2016 0637  NA 135 139  K 4.1 3.9  CL 95* 97*  CO2 33* 34*  GLUCOSE 159* 134*  BUN 23* 21*  CREATININE 1.10 0.91  CALCIUM 8.9 8.8*   CBG:  Recent Labs Lab 08/08/2016 0830 08/06/2016 1212  GLUCAP 99 138*    No results found for this or any previous visit (from the past 240 hour(s)).       Radiology Studies: Dg Chest 2 View  Result Date: 08/21/2016 CLINICAL DATA:  Worsening shortness of breath with chest tightness, productive cough and palpitations. EXAM: CHEST  2 VIEW COMPARISON:  None. FINDINGS: Stable marked prominence of the interstitial markings with diffuse honeycombing. The cardiac silhouette remains borderline enlarged. Biapical pleural thickening is unchanged. Lower thoracic and upper lumbar spine degenerative changes. L1 vertebral compression deformity without significant change since 05/13/2016. No acute fractures. IMPRESSION: No acute abnormality. Stable marked chronic interstitial lung disease. Electronically Signed   By: Claudie Revering M.D.   On: 08/21/2016 21:45        Scheduled Meds: . albuterol  2.5 mg Nebulization Q4H  . budesonide (PULMICORT) nebulizer solution  0.25 mg Nebulization BID  . calcium-vitamin D  1 tablet Oral BID  . diltiazem  120 mg Oral Daily  . enoxaparin (LOVENOX) injection  40 mg Subcutaneous Q24H  . insulin aspart  0-9 Units Subcutaneous TID WC  . methylPREDNISolone (SOLU-MEDROL) injection  40 mg Intravenous Q12H  . methylPREDNISolone (SOLU-MEDROL) injection  40 mg Intravenous Once  . pantoprazole  40 mg Oral Daily  . Pirfenidone  534 mg Oral TID   Continuous Infusions: . doxycycline (VIBRAMYCIN) IV       LOS: 0 days     Shar Paez, MD, FACP, FHM. [facility] Pager [phone]  If 7PM-7AM, please contact night-coverage [email or web address] Password [email or web address] 07/28/2016, 2:57 PM

## 2016-08-22 NOTE — ED Provider Notes (Signed)
[facility] DEPT Provider Note   CSN: [id number] Arrival date & time: 08/21/16  2044  By signing my name below, I, Ny'Kea Lewis, attest that this documentation has been prepared under the direction and in the presence of [place], Kam Rahimi, MD. Electronically Signed: Lise Auer, ED Scribe. 08/18/2016. 1:16 AM.  History   Chief Complaint Chief Complaint  Patient presents with  . Shortness of Breath    Chest Tightness   The history is provided by the patient and a relative. No language interpreter was used.  Shortness of Breath  This is a new problem. The problem occurs continuously.The current episode started more than 2 days ago. The problem has been gradually worsening. Associated symptoms include cough and wheezing. Pertinent negatives include no fever and no rash. It is unknown what precipitated the problem. He has tried nothing for the symptoms. The treatment provided no relief. He has had prior hospitalizations. He has had prior ED visits. Associated medical issues do not include recent surgery.   HPI HPI Comments: [name] is a 71 y.o. male with a PMHx of HTN, and pulmonary fibrosis, who presents to the Emergency Department complaining of gradually worsening, persistent, acute on chronic shortness of breath that worsened yesterday. Pt notes associated cough with yellow phlegm production, subjective tachycardia, wheezing, hot/cold/ tingling BLE sensation that began yesterday. He was seen in the ED on 7/10 and since discharge he has been gradually declining.  Per pt's granddaughter, yesterday he began to experience difficulty breathing while lying flat. He is currently oxygen dependent requiring 5L via [place]. She reports with  Standing he requires 6L. He is compliant with all his medications. No h/o PE/DVT or recent long travel. No recent sick contact. Denies nausea, fever, rash, or any acute associated symptoms noted.    Past Medical History:  Diagnosis Date  . GERD  (gastroesophageal reflux disease)   . Hyperglycemia   . Hypertension   . IPF (idiopathic pulmonary fibrosis) ([place]) 06/27/2016  . Pulmonary fibrosis ([place])   . 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) 07/13/2016   Patient Active Problem List   Diagnosis Date Noted  . Diabetes mellitus ([place]) 08/16/2016  . Malnutrition of moderate degree 08/05/2016  . Pulmonary fibrosis ([place]) 08/01/2016  . Acute on chronic respiratory failure ([place]) 08/01/2016  . Chronic respiratory failure with hypoxia/ noct desats 07/14/2016  . 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 IPF (idiopathic pulmonary fibrosis) ([place]) 06/27/2016  . 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 Acute respiratory distress 06/07/2016  . HCAP (healthcare-associated pneumonia) 06/07/2016  . Sepsis ([place]) 06/07/2016  . HTN (hypertension) 06/07/2016  . Anemia   . Hyperglycemia   . Chest pain on breathing   . Pneumomediastinum ([place])   . Protein-calorie malnutrition, severe 05/15/2016  . Pneumonitis   . Interstitial lung disease ([place]) 05/13/2016  . ILD (interstitial lung disease) ([place]) 05/12/2016  . Weight loss, abnormal 05/12/2016  . Chest pain 05/12/2016  . Sinus tachycardia 05/12/2016  . Dyspnea and respiratory abnormalities 05/12/2016   Past Surgical History:  Procedure Laterality Date  . EYE SURGERY    . HERNIA REPAIR    . TRANSTHORACIC ECHOCARDIOGRAM  05/15/2016   EF 60 -65%. GR 1 DD. Moderate PA pressures estimated at 55 mmHg    Home Medications    Prior to Admission medications   Medication Sig Start Date End Date Taking? Authorizing Provider  acetaminophen (TYLENOL) 325 MG tablet Take 325 mg by mouth every 6 (six) hours as needed (  for pain).    [provider]  albuterol (PROVENTIL HFA;VENTOLIN HFA) 108 (90 Base) MCG/ACT inhaler Inhale 1-2 puffs into the lungs every 4 (four) hours as needed for wheezing or shortness of breath. 07/15/16   Shary Decamp, PA-C  albuterol  (PROVENTIL) (2.5 MG/3ML) 0.083% nebulizer solution Take 3 mLs (2.5 mg total) by nebulization every 4 (four) hours as needed for wheezing or shortness of breath. 07/15/16   Shary Decamp, PA-C  calcium-vitamin D (OSCAL WITH D) 500-200 MG-UNIT tablet Take 1 tablet by mouth 2 (two) times daily. 06/20/16   Ladell Pier, MD  clotrimazole (MYCELEX) 10 MG troche Take 1 tablet (10 mg total) by mouth 5 (five) times daily. 08/14/16   Parrett, Fonnie Mu, NP  diltiazem (CARDIZEM CD) 120 MG 24 hr capsule Take 1 capsule (120 mg total) by mouth daily. 07/13/16 10/11/16  Leonie Man, MD  Glycopyrrolate-Formoterol (BEVESPI AEROSPHERE) 9-4.8 MCG/ACT AERO Inhale 2 puffs into the lungs 2 (two) times daily. 07/21/16   Brand Males, MD  metFORMIN (GLUCOPHAGE) 500 MG tablet Take 1 tablet (500 mg total) by mouth 2 (two) times daily with a meal. 08/16/16   Arnoldo Morale, MD  omeprazole (PRILOSEC) 40 MG capsule Take 1 capsule (40 mg total) by mouth daily. 06/20/16 06/20/17  Ladell Pier, MD  Pirfenidone ([name]) 267 MG CAPS Take 534 mg by mouth 3 (three) times daily.    [provider]  predniSONE (DELTASONE) 20 MG tablet Take 2 tablets (40 mg) by mouth daily starting 7/18. Then take 1 and 1/2 tablets (64m) daily starting 7/25. Then take 1 tablet daily starting 8/1. 08/08/16   HCorey Harold NP   Family History Family History  Problem Relation Age of Onset  . Heart attack Mother    Social History Social History  Substance Use Topics  . Smoking status: Former Smoker    Packs/day: 2.00    Years: 40.00    Quit date: 05/03/2001  . Smokeless tobacco: Never Used  . Alcohol use No   Allergies   Cyclobenzaprine  Review of Systems Review of Systems  Constitutional: Negative for fever.  Respiratory: Positive for cough, shortness of breath and wheezing.   Cardiovascular:       Subjective tachycardia.  Gastrointestinal: Negative for nausea.  Skin: Negative for rash.  Neurological: Negative for  weakness and numbness.  All other systems reviewed and are negative.   Physical Exam Updated Vital Signs BP (!) 150/89 (BP Location: Right Arm)   Pulse (!) 112   Temp (!) 97.5 F (36.4 C) (Oral)   Resp (!) 28   SpO2 95%   Physical Exam  Constitutional: He appears well-developed and well-nourished.  HENT:  Head: Normocephalic.  Mouth/Throat: Oropharynx is clear and moist. No oropharyngeal exudate.  Eyes: Pupils are equal, round, and reactive to light. Conjunctivae and EOM are normal. Right eye exhibits no discharge. Left eye exhibits no discharge. No scleral icterus.  Neck: Normal range of motion. Neck supple. No JVD present. No tracheal deviation present.  Trachea is midline. No stridor or carotid bruits.  Cardiovascular: Normal rate, regular rhythm, normal heart sounds and intact distal pulses.   No murmur heard. Pulmonary/Chest: Effort normal. No stridor. No respiratory distress. He has no wheezes. He has no rales.  Lungs diminished bilaterally.  Abdominal: Soft. He exhibits no distension. There is no tenderness. There is no rebound and no guarding.  Hyperactive bowel sounds.  Musculoskeletal: Normal range of motion. He exhibits no edema or tenderness.  All  compartments are soft. No palpable cords.   Lymphadenopathy:    He has no cervical adenopathy.  Neurological: He is alert. He has normal reflexes. He displays normal reflexes.  Skin: Skin is warm and dry. Capillary refill takes less than 2 seconds.  Psychiatric: He has a normal mood and affect. His behavior is normal.  Nursing note and vitals reviewed.   ED Treatments / Results  DIAGNOSTIC STUDIES: Oxygen Saturation is 95% on RA, adequate by my interpretation.   COORDINATION OF CARE: 12:58 AM-Discussed next steps with pt. Pt verbalized understanding and is agreeable with the plan.   Labs (all labs ordered are listed, but only abnormal results are displayed)  Results for orders placed or performed during the  hospital encounter of 60/45/40  Basic metabolic panel  Result Value Ref Range   Sodium 135 135 - 145 mmol/L   Potassium 4.1 3.5 - 5.1 mmol/L   Chloride 95 (L) 101 - 111 mmol/L   CO2 33 (H) 22 - 32 mmol/L   Glucose, Bld 159 (H) 65 - 99 mg/dL   BUN 23 (H) 6 - 20 mg/dL   Creatinine, Ser 1.10 0.61 - 1.24 mg/dL   Calcium 8.9 8.9 - 10.3 mg/dL   GFR calc non Af Amer >60 >60 mL/min   GFR calc Af Amer >60 >60 mL/min   Anion gap 7 5 - 15  CBC  Result Value Ref Range   WBC 16.8 (H) 4.0 - 10.5 K/uL   RBC 4.89 4.22 - 5.81 MIL/uL   Hemoglobin 14.7 13.0 - 17.0 g/dL   HCT 44.7 39.0 - 52.0 %   MCV 91.4 78.0 - 100.0 fL   MCH 30.1 26.0 - 34.0 pg   MCHC 32.9 30.0 - 36.0 g/dL   RDW 15.3 11.5 - 15.5 %   Platelets 273 150 - 400 K/uL  Brain natriuretic peptide  Result Value Ref Range   B Natriuretic Peptide 30.2 0.0 - 100.0 pg/mL  I-stat troponin, ED  Result Value Ref Range   Troponin i, poc 0.00 0.00 - 0.08 ng/mL   Comment 3           Dg Chest 2 View  Result Date: 08/21/2016 CLINICAL DATA:  Worsening shortness of breath with chest tightness, productive cough and palpitations. EXAM: CHEST  2 VIEW COMPARISON:  None. FINDINGS: Stable marked prominence of the interstitial markings with diffuse honeycombing. The cardiac silhouette remains borderline enlarged. Biapical pleural thickening is unchanged. Lower thoracic and upper lumbar spine degenerative changes. L1 vertebral compression deformity without significant change since 05/13/2016. No acute fractures. IMPRESSION: No acute abnormality. Stable marked chronic interstitial lung disease. Electronically Signed   By: Claudie Revering M.D.   On: 08/21/2016 21:45   Dg Chest Portable 1 View  Result Date: 08/01/2016 CLINICAL DATA:  Shortness of breath, productive cough and fever. History of pulmonary fibrosis. Interstitial lung disease. EXAM: PORTABLE CHEST 1 VIEW COMPARISON:  07/15/2016, 06/07/2016, 05/30/2016 and 05/03/2016 FINDINGS: The patient has severe  chronic interstitial lung disease with multiple chronic areas of increased density in both lungs, and systole unchanged since multiple prior studies. Heart size and pulmonary vascularity are normal. No acute bone abnormality. IMPRESSION: No acute abnormalities.  Severe chronic lung disease. Electronically Signed   By: Lorriane Shire M.D.   On: 08/01/2016 10:32    EKG ED ECG REPORT   Date: 07/26/2016  Rate: 117  Rhythm: sinus tachycardia  QRS Axis: normal  Intervals: normal  ST/T Wave abnormalities: normal  Conduction Disutrbances:none  Narrative  Interpretation: artifact  Old EKG Reviewed: none available  I have personally reviewed the EKG tracing and agree with the computerized printout as noted.  Radiology Dg Chest 2 View  Result Date: 08/21/2016 CLINICAL DATA:  Worsening shortness of breath with chest tightness, productive cough and palpitations. EXAM: CHEST  2 VIEW COMPARISON:  None. FINDINGS: Stable marked prominence of the interstitial markings with diffuse honeycombing. The cardiac silhouette remains borderline enlarged. Biapical pleural thickening is unchanged. Lower thoracic and upper lumbar spine degenerative changes. L1 vertebral compression deformity without significant change since 05/13/2016. No acute fractures. IMPRESSION: No acute abnormality. Stable marked chronic interstitial lung disease. Electronically Signed   By: Claudie Revering M.D.   On: 08/21/2016 21:45    Procedures Procedures (including critical care time)  Medications Ordered in ED  Medications  albuterol (PROVENTIL) (2.5 MG/3ML) 0.083% nebulizer solution 5 mg (5 mg Nebulization Given 07/24/2016 0131)  ipratropium (ATROVENT) nebulizer solution 0.5 mg (0.5 mg Nebulization Given 07/30/2016 0131)       Final Clinical Impressions(s) / ED Diagnoses  SOB: will admit to medicine for further work up and cycling of cardiac enzymes.    I personally performed the services described in this documentation, which was  scribed in my presence. The recorded information has been reviewed and is accurate.      Blayne Garlick, MD 08/04/2016 [phone]

## 2016-08-22 NOTE — H&P (Signed)
History and Physical    [name] VFI:[id number] DOB: 08-Jan-1946 DOA: 08/07/2016  PCP: Ladell Pier, MD  Patient coming from: Home.  Patent attorney used.  Chief Complaint: Shortness of breath.  HPI: [name] is a 71 y.o. male with history of interstitial lung disease/Farmer's lung, pulmonary hypertension, diabetes mellitus type 2, hypertension presents to the ER because of worsening shortness of breath. Patient states over the last 2 days patient has been having increasing shortness of breath even at rest with productive cough. Denies any chest pain. Denies any fever or chills. Patient has been recently admitted to the hospital 2 weeks ago. Last week patient had followed up with pulmonary clinic and was on tapering dose of steroids.   ED Course: In the ER chest x-ray does not show anything acute. Patient was hypoxic requiring 6 L oxygen to maintain sats. Patient is being admitted for acute on chronic respiratory failure.   Review of Systems: As per HPI, rest all negative.   Past Medical History:  Diagnosis Date  . GERD (gastroesophageal reflux disease)   . Hyperglycemia   . Hypertension   . IPF (idiopathic pulmonary fibrosis) ([place]) 06/27/2016  . Pulmonary fibrosis ([place])   . 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 EYE SURGERY    . HERNIA REPAIR    . TRANSTHORACIC ECHOCARDIOGRAM  05/15/2016   EF 60 -65%. GR 1 DD. Moderate PA pressures estimated at 55 mmHg     reports that he quit smoking about 15 years ago. He has a 80.00 pack-year smoking history. He has never used smokeless tobacco. He reports that he does not drink alcohol or use drugs.  Allergies  Allergen Reactions  . Cyclobenzaprine Swelling and Other (See Comments)    Tongue and lips swell    Family History  Problem Relation Age of Onset  . Heart attack Mother     Prior to Admission  medications   Medication Sig Start Date End Date Taking? Authorizing Provider  acetaminophen (TYLENOL) 325 MG tablet Take 325 mg by mouth every 6 (six) hours as needed (for pain).   Yes [provider]  albuterol (PROVENTIL HFA;VENTOLIN HFA) 108 (90 Base) MCG/ACT inhaler Inhale 1-2 puffs into the lungs every 4 (four) hours as needed for wheezing or shortness of breath. 07/15/16  Yes Shary Decamp, PA-C  albuterol (PROVENTIL) (2.5 MG/3ML) 0.083% nebulizer solution Take 3 mLs (2.5 mg total) by nebulization every 4 (four) hours as needed for wheezing or shortness of breath. 07/15/16  Yes Shary Decamp, PA-C  calcium-vitamin D (OSCAL WITH D) 500-200 MG-UNIT tablet Take 1 tablet by mouth 2 (two) times daily. 06/20/16  Yes Ladell Pier, MD  clotrimazole (MYCELEX) 10 MG troche Take 1 tablet (10 mg total) by mouth 5 (five) times daily. 08/14/16  Yes Parrett, Tammy S, NP  diltiazem (CARDIZEM CD) 120 MG 24 hr capsule Take 1 capsule (120 mg total) by mouth daily. 07/13/16 10/11/16 Yes Leonie Man, MD  Glycopyrrolate-Formoterol (BEVESPI AEROSPHERE) 9-4.8 MCG/ACT AERO Inhale 2 puffs into the lungs 2 (two) times daily. 07/21/16  Yes Brand Males, MD  metFORMIN (GLUCOPHAGE) 500 MG tablet Take 1 tablet (500 mg total) by mouth 2 (two) times daily with a meal. 08/16/16  Yes Amao, Enobong, MD  omeprazole (PRILOSEC) 40 MG capsule Take 1 capsule (40 mg total) by mouth daily. 06/20/16 06/20/17 Yes Ladell Pier, MD  Pirfenidone ([name]) [phone]  MG CAPS Take 534 mg by mouth 3 (three) times daily.   Yes [provider]  predniSONE (DELTASONE) 20 MG tablet Take 2 tablets (40 mg) by mouth daily starting 7/18. Then take 1 and 1/2 tablets (44m) daily starting 7/25. Then take 1 tablet daily starting 8/1. Patient taking differently: Take 30 mg by mouth daily with breakfast.  08/08/16  Yes HCorey Harold NP    Physical Exam: Vitals:   07/23/2016 0430 08/21/2016 0500 07/31/2016 0530 07/24/2016 0600  BP: 133/76   133/72 127/77  Pulse: 92 96 80 75  Resp: (!) 33 (!) 32 (!) 30 (!) 32  Temp:      TempSrc:      SpO2: 93% 95% 96% 95%      Constitutional: Moderately built and appears malnourished. Vitals:   08/14/2016 0430 08/21/2016 0500 07/29/2016 0530 07/27/2016 0600  BP: 133/76  133/72 127/77  Pulse: 92 96 80 75  Resp: (!) 33 (!) 32 (!) 30 (!) 32  Temp:      TempSrc:      SpO2: 93% 95% 96% 95%   Eyes: Anicteric no pallor. ENMT: No discharge from the ears eyes nose or mouth. Neck: No JVD appreciated no mass felt. Respiratory: Chest appears tight. Decreased at exchange. Cardiovascular: S1-S2 heard no murmurs appreciated. Abdomen: Soft nontender bowel sounds present. Musculoskeletal: No edema. No joint effusion. Skin: No rash or skin appears warm. Neurologic: Alert awake oriented to time place and person. Moves all extremities. Psychiatric: Appears normal. Normal affect.   Labs on Admission: I have personally reviewed following labs and imaging studies  CBC:  Recent Labs Lab 08/21/16 2101  WBC 16.8*  HGB 14.7  HCT 44.7  MCV 91.4  PLT 2768  Basic Metabolic Panel:  Recent Labs Lab 08/21/16 2101  NA 135  K 4.1  CL 95*  CO2 33*  GLUCOSE 159*  BUN 23*  CREATININE 1.10  CALCIUM 8.9   GFR: Estimated Creatinine Clearance: 48.9 mL/min (by C-G formula based on SCr of 1.1 mg/dL). Liver Function Tests: No results for input(s): AST, ALT, ALKPHOS, BILITOT, PROT, ALBUMIN in the last 168 hours. No results for input(s): LIPASE, AMYLASE in the last 168 hours. No results for input(s): AMMONIA in the last 168 hours. Coagulation Profile: No results for input(s): INR, PROTIME in the last 168 hours. Cardiac Enzymes: No results for input(s): CKTOTAL, CKMB, CKMBINDEX, TROPONINI in the last 168 hours. BNP (last 3 results) No results for input(s): PROBNP in the last 8760 hours. HbA1C: No results for input(s): HGBA1C in the last 72 hours. CBG: No results for input(s): GLUCAP in the last 168  hours. Lipid Profile: No results for input(s): CHOL, HDL, LDLCALC, TRIG, CHOLHDL, LDLDIRECT in the last 72 hours. Thyroid Function Tests: No results for input(s): TSH, T4TOTAL, FREET4, T3FREE, THYROIDAB in the last 72 hours. Anemia Panel: No results for input(s): VITAMINB12, FOLATE, FERRITIN, TIBC, IRON, RETICCTPCT in the last 72 hours. Urine analysis:    Component Value Date/Time   COLORURINE YELLOW 08/01/2016 1131   APPEARANCEUR CLOUDY (A) 08/01/2016 1131   LABSPEC 1.014 08/01/2016 1131   PHURINE 8.0 08/01/2016 1131   GLUCOSEU NEGATIVE 08/01/2016 1131   HGBUR NEGATIVE 08/01/2016 [place]07/10/2016 1131   KETONESUR NEGATIVE 08/01/2016 1131   PROTEINUR NEGATIVE 08/01/2016 1131   NITRITE NEGATIVE 08/01/2016 1131   LEUKOCYTESUR NEGATIVE 08/01/2016 1131   Sepsis Labs: _0 (procalcitonin:4,lacticidven:4) )No results found for this or any previous visit (from the past 240 hour(s)).  Radiological Exams on Admission: Dg Chest 2 View  Result Date: 08/21/2016 CLINICAL DATA:  Worsening shortness of breath with chest tightness, productive cough and palpitations. EXAM: CHEST  2 VIEW COMPARISON:  None. FINDINGS: Stable marked prominence of the interstitial markings with diffuse honeycombing. The cardiac silhouette remains borderline enlarged. Biapical pleural thickening is unchanged. Lower thoracic and upper lumbar spine degenerative changes. L1 vertebral compression deformity without significant change since 05/13/2016. No acute fractures. IMPRESSION: No acute abnormality. Stable marked chronic interstitial lung disease. Electronically Signed   By: Claudie Revering M.D.   On: 08/21/2016 21:45    EKG: Independently reviewed. Sinus tachycardia. (Computer reads it as atrial flutter).  Assessment/Plan Principal Problem:   Acute on chronic respiratory failure (HCC) Active Problems:   HTN (hypertension)   Farmer's lung (HCC)   IPF (idiopathic pulmonary fibrosis) (HCC)    Diabetes mellitus ([place]   Acute respiratory failure ([place])    1. Acute on chronic respiratory failure - likely from exacerbation of his known interstitial lung disease/Farmer's lung with pulmonary hypertension. I have discussed with pulmonologist Dr. Chase Caller who is patient's primary pulmonologist. Patient will be placed on IV steroids and doxycycline and nebulizer treatment and continued his [name]. Check respiratory viral panel procalcitonin levels. Palliative care consult as requested by pulmonologist. 2. Hypertension on Cardizem. 3. Diabetes mellitus type 2 - closely follow CBGs since patient's steroid dose has been increased.  I have discussed with pulmonologist. I have extensively reviewed patient's old charts and labs.   DVT prophylaxis: Lovenox. Code Status: Full code.  Family Communication: Patient's son.  Disposition Plan: Home.  Consults called: Pulmonologist.  Admission status: Inpatient.    Rise Patience MD [facility] Pager [phone].  If 7PM-7AM, please contact night-coverage [email or web address] Password [email or web address]  08/18/2016, 6:11 AM

## 2016-08-22 NOTE — ED Notes (Signed)
ED Provider at bedside.

## 2016-08-23 ENCOUNTER — Encounter (HOSPITAL_COMMUNITY): Payer: Self-pay | Admitting: *Deleted

## 2016-08-23 ENCOUNTER — Inpatient Hospital Stay (HOSPITAL_COMMUNITY): Payer: Medicaid Other

## 2016-08-23 DIAGNOSIS — J84112 Idiopathic pulmonary fibrosis: Principal | ICD-10-CM

## 2016-08-23 LAB — CBC
HEMATOCRIT: 43 % (ref 39.0–52.0)
Hemoglobin: 14.4 g/dL (ref 13.0–17.0)
MCH: 30.3 pg (ref 26.0–34.0)
MCHC: 33.5 g/dL (ref 30.0–36.0)
MCV: 90.5 fL (ref 78.0–100.0)
Platelets: 228 10*3/uL (ref 150–400)
RBC: 4.75 MIL/uL (ref 4.22–5.81)
RDW: 15.4 % (ref 11.5–15.5)
WBC: 15.6 10*3/uL — AB (ref 4.0–10.5)

## 2016-08-23 LAB — GLUCOSE, CAPILLARY
GLUCOSE-CAPILLARY: 201 mg/dL — AB (ref 65–99)
Glucose-Capillary: 150 mg/dL — ABNORMAL HIGH (ref 65–99)
Glucose-Capillary: 186 mg/dL — ABNORMAL HIGH (ref 65–99)
Glucose-Capillary: 167 mg/dL — ABNORMAL HIGH (ref 65–99)

## 2016-08-23 LAB — LEGIONELLA PNEUMOPHILA SEROGP 1 UR AG: L. pneumophila Serogp 1 Ur Ag: NEGATIVE

## 2016-08-23 MED ORDER — DOXYCYCLINE HYCLATE 100 MG IV SOLR
100.0000 mg | INTRAVENOUS | Status: DC
  Administered 2016-08-23 – 2016-08-24 (×2): 100 mg via INTRAVENOUS
  Filled 2016-08-23 (×3): qty 100

## 2016-08-23 MED ORDER — GLUCERNA 1.2 CAL PO LIQD
237.0000 mL | Freq: Two times a day (BID) | ORAL | Status: DC
  Administered 2016-08-23: 237 mL via ORAL
  Filled 2016-08-23 (×4): qty 237

## 2016-08-23 MED ORDER — ENSURE ENLIVE PO LIQD: 237.0000 mL | Freq: Two times a day (BID) | ORAL | Status: DC

## 2016-08-23 MED ORDER — GLUCERNA SHAKE PO LIQD
237.0000 mL | Freq: Three times a day (TID) | ORAL | Status: DC
  Administered 2016-08-23 – 2016-08-29 (×11): 237 mL via ORAL
  Filled 2016-08-23 (×2): qty 237

## 2016-08-23 NOTE — Progress Notes (Signed)
   Name: [name] MRN: [id number] DOB: 09/25/1945    ADMISSION DATE:  08/21/2016 CONSULTATION DATE:  07/26/2016   REFERRING MD :  Dr. Hal Hope   CHIEF COMPLAINT:  Hypoxia   HISTORY OF PRESENT ILLNESS:   71 year old male former smoker with PMH of IPF (followed by Dr. 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 Since discharge from hospital on 7/17 has required 5L [place] and 6L with activity.   Recent admission 7/10-7/17 for bronchitis vs PNA, treated with IV steroids and empiric antibiotics.   SIGNIFICANT EVENTS  7/30 > Presents to ED   STUDIES:  ECHO 05/15/16 > EF 60-65, G1DD, PA 56  CXR 7/30 > Stable marked prominence of the interstitial markings with diffuse honeycombing, cardiac silhouette remains borderline enlarged. Bi-apical pleural thickening is unchanged. Lower thoracic and upper lumbar spine degenerative changes. L1 vertebral compression deformity.    SUBJECTIVE:  Feeling some better.  Family member at bedside translating, also feels he looks near baseline.   VITAL SIGNS: Temp:  [97.9 F (36.6 C)-98.3 F (36.8 C)] 98 F (36.7 C) (08/01 0556) Pulse Rate:  [95-111] 95 (08/01 0556) Resp:  [18-20] 18 (08/01 0556) BP: (112-124)/(68-78) 124/73 (08/01 0556) SpO2:  [89 %-97 %] 94 % (08/01 0847)  PHYSICAL EXAMINATION: General:  Adult male, NAD eating breakfast  Neuro:  Alert, oriented, follows commands  HEENT:  Normocephalic  Cardiovascular:  RRR, no MRG Lungs:  resps even, tachypneic on [place], no distress, crackles  Abdomen:  Non-tender, active bowel sounds  Musculoskeletal:  -edema  Skin:  Warm, dry, intact    Recent Labs Lab  08/21/16 2101 08/16/2016 0637  NA 135 139  K 4.1 3.9  CL 95* 97*  CO2 33* 34*  BUN 23* 21*  CREATININE 1.10 0.91  GLUCOSE 159* 134*    Recent Labs Lab 08/21/16 2101 07/24/2016 0637  HGB 14.7 14.1  HCT 44.7 42.0  WBC 16.8* 16.8*  PLT 273 238   Dg Chest 2 View  Result Date: 08/21/2016 CLINICAL DATA:  Worsening shortness of breath with chest tightness, productive cough and palpitations. EXAM: CHEST  2 VIEW COMPARISON:  None. FINDINGS: Stable marked prominence of the interstitial markings with diffuse honeycombing. The cardiac silhouette remains borderline enlarged. Biapical pleural thickening is unchanged. Lower thoracic and upper lumbar spine degenerative changes. L1 vertebral compression deformity without significant change since 05/13/2016. No acute fractures. IMPRESSION: No acute abnormality. Stable marked chronic interstitial lung disease. Electronically Signed   By: Claudie Revering M.D.   On: 08/21/2016 21:45    ASSESSMENT / PLAN:  Acute on Chronic Hypoxic Respiratory Failure r/t of acute flare ILD (HP) vs disease progression - Improving.  Doubt infectious etiology.  ESR, CRP elevated.  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

## 2016-08-23 NOTE — Progress Notes (Signed)
Called spoke with patient's granddaughter [name] and advised of lab results/recs as stated by TP.  [name] voiced her understanding.  Pt is back in the hospital, admitted yesterday for dyspnea.  Will inform TP and MR.

## 2016-08-23 NOTE — Progress Notes (Signed)
PROGRESS NOTE   [name]  OIZ:[id number]    DOB: 1945/05/15    DOA: 08/21/2016  PCP: Ladell Pier, MD   I have briefly reviewed patients previous medical records in [facility].  Brief Narrative:  71 year old Spanish-speaking male with PMH of pulmonary fibrosis/ILD (Dr. Chase Caller, Pulmonology), previous serum testing positive for [facility] and could be consistent with farmer's lung given patient's history of exposure, being treated with [name], is undergoing lung transplant evaluation at [facility], pulmonary hypertension, DM 2, HTN, hospitalized 7/10-7/17 with acute on chronic hypoxic respiratory failure when he had productive cough, pleurisy and exposure to sick contacts. He had been treated with steroid taper and broad-spectrum antibiotics. At recent discharge, he was on oxygen 4 L/m at rest and 6 L/m with activity. He now presented with 2 days history of worsening dyspnea, cough productive of yellow sputum, chest pain only on coughing, no fevers or chills. Pulmonology consulted.   Assessment & Plan:   Principal Problem:   Acute on chronic respiratory failure (HCC) Active Problems:   HTN (hypertension)   Farmer's lung (HCC)   IPF (idiopathic pulmonary fibrosis) (HCC)   Diabetes mellitus ([place])   Acute respiratory failure ([place])   1. Acute on chronic hypoxic respiratory failure: Secondary to ILD flare versus disease progression, Improving slowly with IV steroids and with doxycycline we are going to wean off the steroids tomorrow. 2. 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 Continue home [name]. 3. Essential hypertension: Controlled. Continue diltiazem. 4. Type II DM: Monitor closely while on steroids. SSI. 5. Leukocytosis: Likely related to ongoing steroid use.    DVT prophylaxis: Lovenox Code Status: Full Family Communication: Discussed with son at  bedside. Disposition: DC home when medically improved.   Consultants:  Pulmonology   Procedures:  None  Antimicrobials:  Doxycycline    Subjective: Patient was interviewed and examined in the presents of interpreter , he reports subjective improvement in his symptoms, shortness of breath is noted by inspection with tachypnea and exertion can worsen shortness of breath.  ROS: Denies.  Objective:  Vitals:   07/24/2016 2337 08/23/16 0556 08/23/16 0847 08/23/16 1156  BP:  124/73    Pulse:  95    Resp:  18    Temp:  98 F (36.7 C)    TempSrc:  Oral    SpO2: 94% 96% 94% 98%  Weight:      Height:        Examination:  General exam: Pleasant middle-aged male, moderately built and nourished, sitting up comfortably in bed. Respiratory system: Severe decrease and air and try bilaterally with scattered wheezing. Cardiovascular system: S1 & S2 heard, RRR. No JVD, murmurs, rubs, gallops or clicks. No pedal edema. Telemetry: Sinus rhythm.  Gastrointestinal system: Abdomen is nondistended, soft and nontender. No organomegaly or masses felt. Normal bowel sounds heard. Central nervous system: Alert and oriented. No focal neurological deficits. Extremities: Symmetric 5 x 5 power. Skin: No rashes, lesions or ulcers Psychiatry: Judgement and insight appear normal. Mood & affect appropriate.     Data Reviewed: I have personally reviewed following labs and imaging studies  CBC:  Recent Labs Lab 08/21/16 2101 08/14/2016 0637 08/23/16 0934  WBC 16.8* 16.8* 15.6*  NEUTROABS  --  14.1*  --   HGB 14.7 14.1 14.4  HCT 44.7 42.0 43.0  MCV 91.4 91.1 90.5  PLT 273 238 338   Basic Metabolic Panel:  Recent  Labs Lab 08/21/16 2101 08/05/2016 0637  NA 135 139  K 4.1 3.9  CL 95* 97*  CO2 33* 34*  GLUCOSE 159* 134*  BUN 23* 21*  CREATININE 1.10 0.91  CALCIUM 8.9 8.8*   CBG:  Recent Labs Lab 08/14/2016 1212 08/09/2016 1648 08/15/2016 2158 08/23/16 0808 08/23/16 1156  GLUCAP 138* 135*  345* 150* 186*    Recent Results (from the past 240 hour(s))  Respiratory Panel by PCR     Status: None   Collection Time: 08/03/2016 12:50 PM  Result Value Ref Range Status   Adenovirus NOT DETECTED NOT DETECTED Final   Coronavirus 229E NOT DETECTED NOT DETECTED Final   Coronavirus HKU1 NOT DETECTED NOT DETECTED Final   Coronavirus NL63 NOT DETECTED NOT DETECTED Final   Coronavirus OC43 NOT DETECTED NOT DETECTED Final   Metapneumovirus NOT DETECTED NOT DETECTED Final   Rhinovirus / Enterovirus NOT DETECTED NOT DETECTED Final   Influenza A NOT DETECTED NOT DETECTED Final   Influenza B NOT DETECTED NOT DETECTED Final   Parainfluenza Virus 1 NOT DETECTED NOT DETECTED Final   Parainfluenza Virus 2 NOT DETECTED NOT DETECTED Final   Parainfluenza Virus 3 NOT DETECTED NOT DETECTED Final   Parainfluenza Virus 4 NOT DETECTED NOT DETECTED Final   Respiratory Syncytial Virus NOT DETECTED NOT DETECTED Final   Bordetella pertussis NOT DETECTED NOT DETECTED Final   Chlamydophila pneumoniae NOT DETECTED NOT DETECTED Final   Mycoplasma pneumoniae NOT DETECTED NOT DETECTED Final         Radiology Studies: Dg Chest 2 View  Result Date: 08/21/2016 CLINICAL DATA:  Worsening shortness of breath with chest tightness, productive cough and palpitations. EXAM: CHEST  2 VIEW COMPARISON:  None. FINDINGS: Stable marked prominence of the interstitial markings with diffuse honeycombing. The cardiac silhouette remains borderline enlarged. Biapical pleural thickening is unchanged. Lower thoracic and upper lumbar spine degenerative changes. L1 vertebral compression deformity without significant change since 05/13/2016. No acute fractures. IMPRESSION: No acute abnormality. Stable marked chronic interstitial lung disease. Electronically Signed   By: Claudie Revering M.D.   On: 08/21/2016 21:45        Scheduled Meds: . albuterol  2.5 mg Nebulization Q4H  . budesonide (PULMICORT) nebulizer solution  0.25 mg  Nebulization BID  . calcium-vitamin D  1 tablet Oral BID  . diltiazem  120 mg Oral Daily  . enoxaparin (LOVENOX) injection  40 mg Subcutaneous Q24H  . insulin aspart  0-9 Units Subcutaneous TID WC  . methylPREDNISolone (SOLU-MEDROL) injection  40 mg Intravenous Q12H  . methylPREDNISolone (SOLU-MEDROL) injection  40 mg Intravenous Once  . pantoprazole  40 mg Oral Daily  . Pirfenidone  534 mg Oral TID   Continuous Infusions: . doxycycline (VIBRAMYCIN) IV Stopped (08/23/16 4332)     LOS: 1 day     Waldron Session, MD, FACP, [employer]. [facility] Pager [phone]  If 7PM-7AM, please contact night-coverage [email or web address] Password [email or web address] 08/23/2016, 1:50 PM

## 2016-08-23 NOTE — Progress Notes (Signed)
Initial Nutrition Assessment  DOCUMENTATION CODES:   Non-severe (moderate) malnutrition in context of chronic illness  INTERVENTION:   -Glucerna Shake po TID, each supplement provides 220 kcal and 10 grams of protein  NUTRITION DIAGNOSIS:   Malnutrition (Mild) related to chronic illness (ILD) as evidenced by mild depletion of body fat, mild depletion of muscle mass.  GOAL:   Patient will meet greater than or equal to 90% of their needs  MONITOR:   PO intake, Supplement acceptance, Labs, Weight trends, Skin, I & O's  REASON FOR ASSESSMENT:   Malnutrition Screening Tool    ASSESSMENT:   Geno Sydnor is a 71 y.o. male with history of interstitial lung disease/Farmer's lung, pulmonary hypertension, diabetes mellitus type 2, hypertension presents to the ER because of worsening shortness of breath. Patient states over the last 2 days patient has been having increasing shortness of breath even at rest with productive cough. Denies any chest pain. Denies any fever or chills. Patient has been recently admitted to the hospital 2 weeks ago. Last week patient had followed up with pulmonary clinic and was on tapering dose of steroids.   Pt familiar to this RD due to multiple prior admissions.   Pt sleeping soundly at time of visit; no family at bedside to provide further hx.   Pt's appetite continues to improve. Pt typically consumes 3 meals and one snack daily. He reports he continues to consume 2 Ensure supplements daily.   Reviewed wt hx, which reveals wt gain trend, which is positive due to hx of malnutrition.   Nutrition-Focused physical exam completed. Findings are mild to moderate fat depletion, mild to moderate muscle depletion, and no edema.  Medications reviewed and include solu-medrol. Although pt typically consumes Ensure, will trial Glucerna due to solu-medrol and high CBGS.   Labs reviewed: CBGS: 135-345.   Diet Order:  Diet heart healthy/carb modified Room  service appropriate? Yes; Fluid consistency: Thin  Skin:  Reviewed, no issues  Last BM:  08/21/16  Height:   Ht Readings from Last 1 Encounters:  08/20/2016 5' 4.8" (1.646 m)    Weight:   Wt Readings from Last 1 Encounters:  08/21/2016 121 lb 6.4 oz (55.1 kg)    Ideal Body Weight:  61.4 kg  BMI:  Body mass index is 20.33 kg/m.  Estimated Nutritional Needs:   Kcal:  1700-1900  Protein:  95-110 grams  Fluid:  1.7-1.9 L  EDUCATION NEEDS:   No education needs identified at this time  Annaleise Burger A. Jimmye Norman, RD, LDN, CDE Pager: [phone] After hours Pager: [phone]

## 2016-08-23 NOTE — Progress Notes (Signed)
Patient with Hx. of Diabetes had an order for Ensure BID. MD made aware and per his verbal order Ensure was changed with Glucerna. Will continue to monitor.

## 2016-08-23 DEATH — deceased

## 2016-08-24 DIAGNOSIS — Z515 Encounter for palliative care: Secondary | ICD-10-CM

## 2016-08-24 LAB — BLOOD GAS, ARTERIAL
ACID-BASE EXCESS: 5.3 mmol/L — AB (ref 0.0–2.0)
BICARBONATE: 29.1 mmol/L — AB (ref 20.0–28.0)
Drawn by: 406621
O2 CONTENT: 5 L/min
O2 Saturation: 89.4 %
PCO2 ART: 40.7 mmHg (ref 32.0–48.0)
PH ART: 7.468 — AB (ref 7.350–7.450)
PO2 ART: 55.5 mmHg — AB (ref 83.0–108.0)
Patient temperature: 98.6

## 2016-08-24 LAB — COMPREHENSIVE METABOLIC PANEL
ALT: 27 U/L (ref 17–63)
AST: 23 U/L (ref 15–41)
Albumin: 2.6 g/dL — ABNORMAL LOW (ref 3.5–5.0)
Alkaline Phosphatase: 42 U/L (ref 38–126)
Anion gap: 8 (ref 5–15)
BILIRUBIN TOTAL: 0.4 mg/dL (ref 0.3–1.2)
BUN: 18 mg/dL (ref 6–20)
CO2: 33 mmol/L — ABNORMAL HIGH (ref 22–32)
CREATININE: 1.01 mg/dL (ref 0.61–1.24)
Calcium: 9.1 mg/dL (ref 8.9–10.3)
Chloride: 99 mmol/L — ABNORMAL LOW (ref 101–111)
Glucose, Bld: 180 mg/dL — ABNORMAL HIGH (ref 65–99)
Potassium: 5.1 mmol/L (ref 3.5–5.1)
Sodium: 140 mmol/L (ref 135–145)
TOTAL PROTEIN: 5.9 g/dL — AB (ref 6.5–8.1)

## 2016-08-24 LAB — CBC
HEMATOCRIT: 40.5 % (ref 39.0–52.0)
Hemoglobin: 13.6 g/dL (ref 13.0–17.0)
MCH: 30.4 pg (ref 26.0–34.0)
MCHC: 33.6 g/dL (ref 30.0–36.0)
MCV: 90.6 fL (ref 78.0–100.0)
Platelets: 238 10*3/uL (ref 150–400)
RBC: 4.47 MIL/uL (ref 4.22–5.81)
RDW: 15.4 % (ref 11.5–15.5)
WBC: 15.8 10*3/uL — AB (ref 4.0–10.5)

## 2016-08-24 LAB — GLUCOSE, CAPILLARY
GLUCOSE-CAPILLARY: 153 mg/dL — AB (ref 65–99)
GLUCOSE-CAPILLARY: 255 mg/dL — AB (ref 65–99)
Glucose-Capillary: 166 mg/dL — ABNORMAL HIGH (ref 65–99)
Glucose-Capillary: 150 mg/dL — ABNORMAL HIGH (ref 65–99)

## 2016-08-24 LAB — MRSA PCR SCREENING: MRSA by PCR: NEGATIVE

## 2016-08-24 LAB — PROCALCITONIN: Procalcitonin: <0.1 ng/mL

## 2016-08-24 MED ORDER — METHYLPREDNISOLONE SODIUM SUCC 125 MG IJ SOLR
80.0000 mg | Freq: Four times a day (QID) | INTRAMUSCULAR | Status: DC
  Administered 2016-08-24 – 2016-08-27 (×12): 80 mg via INTRAVENOUS
  Filled 2016-08-24 (×12): qty 2

## 2016-08-24 MED ORDER — ALBUTEROL SULFATE (2.5 MG/3ML) 0.083% IN NEBU
2.5000 mg | INHALATION_SOLUTION | Freq: Four times a day (QID) | RESPIRATORY_TRACT | Status: DC
  Administered 2016-08-24 (×3): 2.5 mg via RESPIRATORY_TRACT
  Filled 2016-08-24 (×3): qty 3

## 2016-08-24 MED ORDER — IPRATROPIUM BROMIDE 0.02 % IN SOLN: 0.5000 mg | Freq: Four times a day (QID) | RESPIRATORY_TRACT | Status: DC | PRN

## 2016-08-24 MED ORDER — LEVALBUTEROL HCL 0.63 MG/3ML IN NEBU: 0.6300 mg | INHALATION_SOLUTION | Freq: Four times a day (QID) | RESPIRATORY_TRACT | Status: DC | PRN

## 2016-08-24 MED ORDER — DEXTROSE 5 % IV SOLN
1.0000 g | Freq: Two times a day (BID) | INTRAVENOUS | Status: DC
  Filled 2016-08-24: qty 1

## 2016-08-24 MED ORDER — METHYLPREDNISOLONE SODIUM SUCC 125 MG IJ SOLR: 125.0000 mg | Freq: Four times a day (QID) | INTRAMUSCULAR | Status: DC

## 2016-08-24 MED ORDER — SODIUM CHLORIDE 0.9 % IV SOLN
250.0000 mg | Freq: Four times a day (QID) | INTRAVENOUS | Status: DC
  Filled 2016-08-24 (×2): qty 2

## 2016-08-24 MED ORDER — LEVALBUTEROL HCL 0.63 MG/3ML IN NEBU
0.6300 mg | INHALATION_SOLUTION | Freq: Three times a day (TID) | RESPIRATORY_TRACT | Status: DC
  Administered 2016-08-24 – 2016-08-28 (×11): 0.63 mg via RESPIRATORY_TRACT
  Filled 2016-08-24 (×11): qty 3

## 2016-08-24 MED ORDER — METHYLPREDNISOLONE SODIUM SUCC 125 MG IJ SOLR
INTRAMUSCULAR | Status: AC
  Administered 2016-08-24: 125 mg
  Filled 2016-08-24: qty 2

## 2016-08-24 NOTE — Progress Notes (Signed)
Name: Ajai Harville MRN: [id number] DOB: [birth year]    ADMISSION DATE:  08/21/2016 CONSULTATION DATE:  08/01/2016   REFERRING MD :  Dr. Hal Hope   CHIEF COMPLAINT:  Hypoxia   HISTORY OF PRESENT ILLNESS:   71 year old male former smoker with PMH of IPF (followed by Dr. 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 Since discharge from hospital on 7/17 has required 5L [place] and 6L with activity.   Recent admission 7/10-7/17 for bronchitis vs PNA, treated with IV steroids and empiric antibiotics.   SIGNIFICANT EVENTS  7/30 > Presents to ED   STUDIES:  ECHO 05/15/16 > EF 60-65, G1DD, PA 56  CXR 7/30 > Stable marked prominence of the interstitial markings with diffuse honeycombing, cardiac silhouette remains borderline enlarged. Bi-apical pleural thickening is unchanged. Lower thoracic and upper lumbar spine degenerative changes. L1 vertebral compression deformity.  HRCT 8/1>>> 1. Progressive pulmonary fibrosis, as detailed above, considered a definite usual interstitial pneumonia (UIP) pattern. 2. There is also moderate centrilobular and paraseptal emphysema. 3. Aortic atherosclerosis.  SUBJECTIVE:  C/o SOB.  Feels no better than yesterday.  Some cough.   VITAL SIGNS: Temp:  [98 F (36.7 C)-98.1 F (36.7 C)] 98 F (36.7 C) (08/02 0615) Pulse Rate:  [87-122] 87 (08/02 0615) Resp:  [17-22] 17 (08/02 0615) BP: (124-148)/(71-83) 132/71 (08/02 0615) SpO2:  [89 %-98 %] 98 % (08/02 0853) Weight:  [54.6 kg (120 lb 5.9 oz)] 54.6 kg (120 lb 5.9 oz) (08/02 0615)  PHYSICAL EXAMINATION: General:  Adult male, NAD, needs to use restroom, waiting for nurse  Neuro:   Alert, oriented, follows commands  HEENT:  Normocephalic  Cardiovascular:  RRR, no MRG Lungs:  resps even, shallow, tachypneic on , no distress, crackles  Abdomen:  Non-tender, active bowel sounds  Musculoskeletal:  -edema  Skin:  Warm, dry, intact    Recent Labs Lab 08/21/16 2101 08/21/2016 0637 08/24/16 0404  NA 135 139 140  K 4.1 3.9 5.1  CL 95* 97* 99*  CO2 33* 34* 33*  BUN 23* 21* 18  CREATININE 1.10 0.91 1.01  GLUCOSE 159* 134* 180*    Recent Labs Lab 07/26/2016 0637 08/23/16 0934 08/24/16 0404  HGB 14.1 14.4 13.6  HCT 42.0 43.0 40.5  WBC 16.8* 15.6* 15.8*  PLT 238 228 238   Ct Chest High Resolution  Result Date: 08/23/2016 CLINICAL DATA:  71 year old male with shortness of breath. Evaluate for interstitial lung disease. EXAM: CT CHEST WITHOUT CONTRAST TECHNIQUE: Multidetector CT imaging of the chest was performed following the standard protocol without intravenous contrast. High resolution imaging of the lungs, as well as inspiratory and expiratory imaging, was performed. COMPARISON:  Chest CT 05/13/2016. FINDINGS: Comment: Study is limited by considerable patient respiratory motion. Cardiovascular: Heart size is borderline enlarged. There is no significant pericardial fluid, thickening or pericardial calcification. Aortic atherosclerosis. No definite coronary artery calcifications are identified. Mediastinum/Nodes: Several borderline enlarged and mildly enlarged mediastinal and hilar lymph nodes are noted measuring up to 1 cm in short axis in the subcarinal nodal station. Esophagus is unremarkable in appearance. No axillary lymphadenopathy. Lungs/Pleura: High-resolution images demonstrate diffuse septal thickening, subpleural reticulation, extensive cylindrical and varicose bronchiectasis, rather diffuse thickening of the peribronchovascular  interstitium and scattered areas of parenchymal banding. These findings have a definitive craniocaudal gradient, and have significantly  worsened compared to prior study from 05/13/2016. Inspiratory and expiratory images are considered nondiagnostic due to patient motion and respiration. There is also a background of moderate centrilobular and paraseptal emphysema. No acute consolidative airspace disease. No pleural effusions. Upper Abdomen: Unremarkable. Musculoskeletal: There are no aggressive appearing lytic or blastic lesions noted in the visualized portions of the skeleton. IMPRESSION: 1. Progressive pulmonary fibrosis, as detailed above, considered a definite usual interstitial pneumonia (UIP) pattern. 2. There is also moderate centrilobular and paraseptal emphysema. 3. Aortic atherosclerosis. Aortic Atherosclerosis (ICD10-I70.0) and Emphysema (ICD10-J43.9). Electronically Signed   By: Vinnie Langton M.D.   On: 08/23/2016 16:31    ASSESSMENT / PLAN:  Acute on Chronic Hypoxic Respiratory Failure r/t of acute flare ILD (HP) vs disease progression - minimal improvement since yesterday.  Doubt infectious etiology.  ESR, CRP elevated. Significant progression of his IPF which high res CT confirms.  Not transplant candidate.  H/O Chronic Cough  IPF secondary to farmers lung hypersensitivity pneumonitis - on daily prednisone  Plan  - will consult palliative care  -good candidate for home with hospice  -Maintain oxygenation >90 -follow Sputum Culture  -Pulmonary Hygiene  -Schedule Albuterol and PRN  -Continue Doxycycline  -continue solumedrol with slow taper  -continue home [place], NP 08/24/2016  11:47 AM Pager: [phone] or [phone]

## 2016-08-24 NOTE — Progress Notes (Signed)
Per Rn patient with sudden onset of SOB.  Upon my arrival patient breathing easier.  Still with crackles through out. Placed on 5L HFNC,  ABG done. Orders received for solumedrol.  MD at bedside speaking to the patient via video translator.  Plan transfer to SDU.

## 2016-08-24 NOTE — Progress Notes (Signed)
Interpreter [name] for [employer]

## 2016-08-24 NOTE — Progress Notes (Signed)
PROGRESS NOTE   [name]  XHB:[id number]    DOB: [birth year]    DOA: 08/06/2016  PCP: Ladell Pier, MD   I have briefly reviewed patients previous medical records in [facility].  Brief Narrative:  71 year old Spanish-speaking male with PMH of pulmonary fibrosis/ILD (Dr. Chase Caller, Pulmonology), previous serum testing positive for [facility] and could be consistent with farmer's lung given patient's history of exposure, being treated with [name], is undergoing lung transplant evaluation at [facility], pulmonary hypertension, DM 2, HTN, hospitalized 7/10-7/17 with acute on chronic hypoxic respiratory failure when he had productive cough, pleurisy and exposure to sick contacts. He had been treated with steroid taper and broad-spectrum antibiotics. At recent discharge, he was on oxygen 4 L/m at rest and 6 L/m with activity. He now presented with 2 days history of worsening dyspnea, cough productive of yellow sputum, chest pain only on coughing, no fevers or chills. Pulmonology consulted.   Assessment & Plan:   Principal Problem:   Acute on chronic respiratory failure (HCC) Active Problems:   HTN (hypertension)   Farmer's lung (HCC)   IPF (idiopathic pulmonary fibrosis) (HCC)   Diabetes mellitus ([place])   Acute respiratory failure ([place])   1. Acute on chronic hypoxic respiratory failure: Secondary to ILD flare versus disease progression, Improving slowly with IV steroids and with doxycycline we are going to wean off the steroids tomorrow. 2. 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 Continue home [name]. 3. Essential hypertension: Controlled. Continue diltiazem. 4. Type II DM: Monitor closely while on steroids. SSI. 5. Leukocytosis: Likely related to ongoing steroid use.    DVT prophylaxis: Lovenox Code Status: Full Family Communication: Discussed with son at  bedside. Disposition: DC home when medically improved.   Consultants:  Pulmonology   Procedures:  None  Antimicrobials:  Doxycycline    Subjective: Patient was interviewed and examined in the presents of interpreter , he reports subjective improvement in his symptoms, shortness of breath is noted by inspection with tachypnea and exertion can worsen shortness of breath.  ROS: Denies.  Objective:  Vitals:   08/24/16 0157 08/24/16 0615 08/24/16 0853 08/24/16 1147  BP:  132/71    Pulse:  87    Resp:  17    Temp:  98 F (36.7 C)    TempSrc:  Oral    SpO2: 96% 98% 98% 94%  Weight:  54.6 kg (120 lb 5.9 oz)    Height:        Examination:  General exam: Pleasant middle-aged male, moderately built and nourished, sitting up comfortably in bed. Respiratory system: Severe decrease and air and try bilaterally with scattered wheezing. Cardiovascular system: S1 & S2 heard, RRR. No JVD, murmurs, rubs, gallops or clicks. No pedal edema. Telemetry: Sinus rhythm.  Gastrointestinal system: Abdomen is nondistended, soft and nontender. No organomegaly or masses felt. Normal bowel sounds heard. Central nervous system: Alert and oriented. No focal neurological deficits. Extremities: Symmetric 5 x 5 power. Skin: No rashes, lesions or ulcers Psychiatry: Judgement and insight appear normal. Mood & affect appropriate.     Data Reviewed: I have personally reviewed following labs and imaging studies  CBC:  Recent Labs Lab 08/21/16 2101 08/21/2016 0637 08/23/16 0934 08/24/16 0404  WBC 16.8* 16.8* 15.6* 15.8*  NEUTROABS  --  14.1*  --   --   HGB 14.7 14.1 14.4 13.6  HCT 44.7 42.0 43.0 40.5  MCV 91.4 91.1  90.5 90.6  PLT 273 238 228 812   Basic Metabolic Panel:  Recent Labs Lab 08/21/16 2101 07/31/2016 0637 08/24/16 0404  NA 135 139 140  K 4.1 3.9 5.1  CL 95* 97* 99*  CO2 33* 34* 33*  GLUCOSE 159* 134* 180*  BUN 23* 21* 18  CREATININE 1.10 0.91 1.01  CALCIUM 8.9 8.8* 9.1    CBG:  Recent Labs Lab 08/23/16 0808 08/23/16 1156 08/23/16 1718 08/23/16 2158 08/24/16 0825  GLUCAP 150* 186* 167* 201* 153*    Recent Results (from the past 240 hour(s))  Respiratory Panel by PCR     Status: None   Collection Time: 07/23/2016 12:50 PM  Result Value Ref Range Status   Adenovirus NOT DETECTED NOT DETECTED Final   Coronavirus 229E NOT DETECTED NOT DETECTED Final   Coronavirus HKU1 NOT DETECTED NOT DETECTED Final   Coronavirus NL63 NOT DETECTED NOT DETECTED Final   Coronavirus OC43 NOT DETECTED NOT DETECTED Final   Metapneumovirus NOT DETECTED NOT DETECTED Final   Rhinovirus / Enterovirus NOT DETECTED NOT DETECTED Final   Influenza A NOT DETECTED NOT DETECTED Final   Influenza B NOT DETECTED NOT DETECTED Final   Parainfluenza Virus 1 NOT DETECTED NOT DETECTED Final   Parainfluenza Virus 2 NOT DETECTED NOT DETECTED Final   Parainfluenza Virus 3 NOT DETECTED NOT DETECTED Final   Parainfluenza Virus 4 NOT DETECTED NOT DETECTED Final   Respiratory Syncytial Virus NOT DETECTED NOT DETECTED Final   Bordetella pertussis NOT DETECTED NOT DETECTED Final   Chlamydophila pneumoniae NOT DETECTED NOT DETECTED Final   Mycoplasma pneumoniae NOT DETECTED NOT DETECTED Final         Radiology Studies: Ct Chest High Resolution  Result Date: 08/23/2016 CLINICAL DATA:  71 year old male with shortness of breath. Evaluate for interstitial lung disease. EXAM: CT CHEST WITHOUT CONTRAST TECHNIQUE: Multidetector CT imaging of the chest was performed following the standard protocol without intravenous contrast. High resolution imaging of the lungs, as well as inspiratory and expiratory imaging, was performed. COMPARISON:  Chest CT 05/13/2016. FINDINGS: Comment: Study is limited by considerable patient respiratory motion. Cardiovascular: Heart size is borderline enlarged. There is no significant pericardial fluid, thickening or pericardial calcification. Aortic atherosclerosis. No  definite coronary artery calcifications are identified. Mediastinum/Nodes: Several borderline enlarged and mildly enlarged mediastinal and hilar lymph nodes are noted measuring up to 1 cm in short axis in the subcarinal nodal station. Esophagus is unremarkable in appearance. No axillary lymphadenopathy. Lungs/Pleura: High-resolution images demonstrate diffuse septal thickening, subpleural reticulation, extensive cylindrical and varicose bronchiectasis, rather diffuse thickening of the peribronchovascular interstitium and scattered areas of parenchymal banding. These findings have a definitive craniocaudal gradient, and have significantly worsened compared to prior study from 05/13/2016. Inspiratory and expiratory images are considered nondiagnostic due to patient motion and respiration. There is also a background of moderate centrilobular and paraseptal emphysema. No acute consolidative airspace disease. No pleural effusions. Upper Abdomen: Unremarkable. Musculoskeletal: There are no aggressive appearing lytic or blastic lesions noted in the visualized portions of the skeleton. IMPRESSION: 1. Progressive pulmonary fibrosis, as detailed above, considered a definite usual interstitial pneumonia (UIP) pattern. 2. There is also moderate centrilobular and paraseptal emphysema. 3. Aortic atherosclerosis. Aortic Atherosclerosis (ICD10-I70.0) and Emphysema (ICD10-J43.9). Electronically Signed   By: Vinnie Langton M.D.   On: 08/23/2016 16:31        Scheduled Meds: . albuterol  2.5 mg Nebulization Q6H  . budesonide (PULMICORT) nebulizer solution  0.25 mg Nebulization BID  . calcium-vitamin D  1 tablet Oral BID  . diltiazem  120 mg Oral Daily  . enoxaparin (LOVENOX) injection  40 mg Subcutaneous Q24H  . feeding supplement (GLUCERNA SHAKE)  237 mL Oral TID BM  . insulin aspart  0-9 Units Subcutaneous TID WC  . methylPREDNISolone (SOLU-MEDROL) injection  125 mg Intravenous Q6H  . methylPREDNISolone  (SOLU-MEDROL) injection  40 mg Intravenous Once  . pantoprazole  40 mg Oral Daily  . Pirfenidone  534 mg Oral TID   Continuous Infusions: . doxycycline (VIBRAMYCIN) IV Stopped (08/24/16 1127)     LOS: 2 days     Waldron Session, MD, FACP, [facility]. [facility] Pager [phone]  If 7PM-7AM, please contact night-coverage [email or web address] Password [email or web address] 08/24/2016, 11:52 AM

## 2016-08-24 NOTE — Progress Notes (Signed)
Patient had sudden onset of SOB, C/O tightness in his chest, RR 32, HR 152, B/P 153/74, Patient unable to lay in bed had to sit upright. Lung sounds increased crackles noted, oxygen sats 89%, increased O2 flow to 6 LPM. MD & raqpid response notified, arrived, MD ordered Solumedrol 17m now, solumedrol given. MD ordered patient transferred to stepdown unit, also ordered foley catheter placement. Patient refused foley catheter, said he would use the urinal. Waiting for notification on a room on stepdown.

## 2016-08-24 NOTE — Progress Notes (Signed)
PROGRESS NOTE   [name]  [id number]:[id number]    DOB: 18-Jun-1945    DOA: 08/21/2016  PCP: Ladell Pier, MD   I have briefly reviewed patients previous medical records in [facility].  Brief Narrative:  71 year old Spanish-speaking male with PMH of pulmonary fibrosis/ILD (Dr. Chase Caller, Pulmonology), previous serum testing positive for [facility] and could be consistent with farmer's lung given patient's history of exposure, being treated with [name], is undergoing lung transplant evaluation at [facility], pulmonary hypertension, DM 2, HTN, hospitalized 7/10-7/17 with acute on chronic hypoxic respiratory failure when he had productive cough, pleurisy and exposure to sick contacts. He had been treated with steroid taper and broad-spectrum antibiotics. At recent discharge, he was on oxygen 4 L/m at rest and 6 L/m with activity. He now presented with 2 days history of worsening dyspnea, cough productive of yellow sputum, chest pain only on coughing, no fevers or chills. Pulmonology consulted.   Assessment & Plan:   Principal Problem:   Acute on chronic respiratory failure (HCC) Active Problems:   HTN (hypertension)   Farmer's lung (HCC)   IPF (idiopathic pulmonary fibrosis) (HCC)   Diabetes mellitus ([place])   Acute respiratory failure ([place])   1. Acute on chronic hypoxic respiratory failure: Secondary to ILD  Progression,CT confirmed ,  worsening today , will increase the steroids to 250 MG IV Q6H , Nubs q6hprn , add Cefipim . 2. 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 3. Essential hypertension: Controlled. Continue diltiazem. 4. Type II DM: Monitor closely while on steroids. SSI. 5. Leukocytosis: Likely related to ongoing steroid use.    DVT prophylaxis: Lovenox Code Status: Full Family Communication: Discussed with  son at bedside. Disposition: DC home when medically improved.   Consultants:  Pulmonology   Procedures:  None  Antimicrobials:  Doxycycline    Subjective: The patient is complaining of severe shortness of breath today with minimal exertion, severe shortness of breath at rest, he went to severe respiratory distress with tachycardia and tachypnea at some point, I ordered ABG and increase the steroid dose and sent to the stepdown.    Objective:  Vitals:   08/24/16 0157 08/24/16 0615 08/24/16 0853 08/24/16 1147  BP:  132/71    Pulse:  87    Resp:  17    Temp:  98 F (36.7 C)    TempSrc:  Oral    SpO2: 96% 98% 98% 94%  Weight:  54.6 kg (120 lb 5.9 oz)    Height:        Examination:  General exam: Pleasant middle-aged male, moderately built and nourished, sitting up comfortably in bed. Respiratory system: Severe decrease and air and try bilaterally with scattered wheezing and crackles. Cardiovascular system: S1 & S2 heard, RRR. No JVD, murmurs, rubs, gallops or clicks. No pedal edema. Telemetry: Sinus rhythm.  Gastrointestinal system: Abdomen is nondistended, soft and nontender. No organomegaly or masses felt. Normal bowel sounds heard. Central nervous system: Alert and oriented. No focal neurological deficits. Extremities: Symmetric 5 x 5 power. Skin: No rashes, lesions or ulcers Psychiatry: Judgement and insight appear normal. Mood & affect appropriate.     Data Reviewed: I have personally reviewed following labs and imaging studies  CBC:  Recent Labs Lab 08/21/16 2101 08/19/2016 0637 08/23/16 0934 08/24/16 0404  WBC 16.8* 16.8* 15.6* 15.8*  NEUTROABS  --  14.1*  --   --  HGB 14.7 14.1 14.4 13.6  HCT 44.7 42.0 43.0 40.5  MCV 91.4 91.1 90.5 90.6  PLT 273 238 228 712   Basic Metabolic Panel:  Recent Labs Lab 08/21/16 2101 08/14/2016 0637 08/24/16 0404  NA 135 139 140  K 4.1 3.9 5.1  CL 95* 97* 99*  CO2 33* 34* 33*  GLUCOSE 159* 134* 180*  BUN 23* 21*  18  CREATININE 1.10 0.91 1.01  CALCIUM 8.9 8.8* 9.1   CBG:  Recent Labs Lab 08/23/16 0808 08/23/16 1156 08/23/16 1718 08/23/16 2158 08/24/16 0825  GLUCAP 150* 186* 167* 201* 153*    Recent Results (from the past 240 hour(s))  Respiratory Panel by PCR     Status: None   Collection Time: 07/31/2016 12:50 PM  Result Value Ref Range Status   Adenovirus NOT DETECTED NOT DETECTED Final   Coronavirus 229E NOT DETECTED NOT DETECTED Final   Coronavirus HKU1 NOT DETECTED NOT DETECTED Final   Coronavirus NL63 NOT DETECTED NOT DETECTED Final   Coronavirus OC43 NOT DETECTED NOT DETECTED Final   Metapneumovirus NOT DETECTED NOT DETECTED Final   Rhinovirus / Enterovirus NOT DETECTED NOT DETECTED Final   Influenza A NOT DETECTED NOT DETECTED Final   Influenza B NOT DETECTED NOT DETECTED Final   Parainfluenza Virus 1 NOT DETECTED NOT DETECTED Final   Parainfluenza Virus 2 NOT DETECTED NOT DETECTED Final   Parainfluenza Virus 3 NOT DETECTED NOT DETECTED Final   Parainfluenza Virus 4 NOT DETECTED NOT DETECTED Final   Respiratory Syncytial Virus NOT DETECTED NOT DETECTED Final   Bordetella pertussis NOT DETECTED NOT DETECTED Final   Chlamydophila pneumoniae NOT DETECTED NOT DETECTED Final   Mycoplasma pneumoniae NOT DETECTED NOT DETECTED Final         Radiology Studies: Ct Chest High Resolution  Result Date: 08/23/2016 CLINICAL DATA:  71 year old male with shortness of breath. Evaluate for interstitial lung disease. EXAM: CT CHEST WITHOUT CONTRAST TECHNIQUE: Multidetector CT imaging of the chest was performed following the standard protocol without intravenous contrast. High resolution imaging of the lungs, as well as inspiratory and expiratory imaging, was performed. COMPARISON:  Chest CT 05/13/2016. FINDINGS: Comment: Study is limited by considerable patient respiratory motion. Cardiovascular: Heart size is borderline enlarged. There is no significant pericardial fluid, thickening or  pericardial calcification. Aortic atherosclerosis. No definite coronary artery calcifications are identified. Mediastinum/Nodes: Several borderline enlarged and mildly enlarged mediastinal and hilar lymph nodes are noted measuring up to 1 cm in short axis in the subcarinal nodal station. Esophagus is unremarkable in appearance. No axillary lymphadenopathy. Lungs/Pleura: High-resolution images demonstrate diffuse septal thickening, subpleural reticulation, extensive cylindrical and varicose bronchiectasis, rather diffuse thickening of the peribronchovascular interstitium and scattered areas of parenchymal banding. These findings have a definitive craniocaudal gradient, and have significantly worsened compared to prior study from 05/13/2016. Inspiratory and expiratory images are considered nondiagnostic due to patient motion and respiration. There is also a background of moderate centrilobular and paraseptal emphysema. No acute consolidative airspace disease. No pleural effusions. Upper Abdomen: Unremarkable. Musculoskeletal: There are no aggressive appearing lytic or blastic lesions noted in the visualized portions of the skeleton. IMPRESSION: 1. Progressive pulmonary fibrosis, as detailed above, considered a definite usual interstitial pneumonia (UIP) pattern. 2. There is also moderate centrilobular and paraseptal emphysema. 3. Aortic atherosclerosis. Aortic Atherosclerosis (ICD10-I70.0) and Emphysema (ICD10-J43.9). Electronically Signed   By: Vinnie Langton M.D.   On: 08/23/2016 16:31        Scheduled Meds: . albuterol  2.5 mg Nebulization Q6H  .  budesonide (PULMICORT) nebulizer solution  0.25 mg Nebulization BID  . calcium-vitamin D  1 tablet Oral BID  . diltiazem  120 mg Oral Daily  . enoxaparin (LOVENOX) injection  40 mg Subcutaneous Q24H  . feeding supplement (GLUCERNA SHAKE)  237 mL Oral TID BM  . insulin aspart  0-9 Units Subcutaneous TID WC  . methylPREDNISolone (SOLU-MEDROL) injection  125  mg Intravenous Q6H  . methylPREDNISolone (SOLU-MEDROL) injection  40 mg Intravenous Once  . pantoprazole  40 mg Oral Daily  . Pirfenidone  534 mg Oral TID   Continuous Infusions: . doxycycline (VIBRAMYCIN) IV Stopped (08/24/16 1127)     LOS: 2 days     Waldron Session, MD, FACP, [facility]. [facility] Pager [phone]  If 7PM-7AM, please contact night-coverage [email or web address] Password [email or web address] 08/24/2016, 12:19 PM

## 2016-08-24 NOTE — Consult Note (Signed)
Consultation Note Date: 08/24/2016   Patient Name: [name]  DOB: May 05, 1945  MRN: [id number]  Age / Sex: 71 y.o., male  PCP: Ladell Pier, MD Referring Physician: Waldron Session, MD  Reason for Consultation: Establishing goals of care, Interfamily conflict and Psychosocial/spiritual support  HPI/Patient Profile: 71 y.o. 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 He is followed by Dr. Chase Caller. Pt had been referred to [facility] for transplant consideration, however evaluation is on hold until he has obtained adequate insurance (per 08/10/16 note in [place]). He was here 7/10-7/17 for acute on chronic respiratory failure r/t CAP. He did complete a full course of abx and increased steroids. He now presents again with acute on chronic respiratory failure d/t IPF flare. He was admitted on 08/04/2016. High resolution CT confirmed disease progression, with Pulmonology feeling he likely a rapid progressor. Pulm recommended Hospice as they do not feel he is a transplant candidate and he is declining. Palliative consulted to assist in clarifying GOC.   Clinical Assessment and Goals of Care: I met with the patient, his son, and a granddaughter at this bedside. Spanish interpreter was present and provided the translation for our conversation. After introducing myself I shared that my team was consulted to help him and his family understand his disease, and discuss options for trajectories of care. He was very appreciative of our involvement. He feels like he has a decent grasp on his lung disease, but is concerned that other family members need more clarification. He would like a chance for everyone to hear the same information, have the opportunity to ask questions, and come up with a plan for the next steps.   In our interaction he did  share that he understood his lungs were "collapsed and gone." He couldn't clarify what he meant by that, but did seem to grasp the significance of his underlying disease, and that it is not going to improve. That said, he also expressed the plan to think positively and look towards a healthier future. His granddaughter in the room followed that sentiment with a question about seeking a second opinion from [facility]. In review of the chart, it seem evaluation at [facility] did not proceed because he lacked insurance. The granddaughter relates that other family members have received financial assistance from [facility] before, and is hopeful maybe they would at least evaluate him as they work through the [employer] application.   Primary Decision Maker PATIENT   SUMMARY OF RECOMMENDATIONS    Need larger family meeting, as requested by the patient. Meeting scheduled for 8/3 at 1500, I confirmed a Patent attorney would be present. Pt to contact family who need to be in attendance.   Code Status/Advance Care Planning:  Full code; I did not address in this first meeting. He indicated he relies on family for consensus decision making, so I would start this conversation, as appropriate, at the family meeting.   Psycho-social/Spiritual:   Desire for further Chaplaincy support:no  Additional Recommendations: TBD  Prognosis:   < 6 months in the setting of rapidly progressive pulmonary fibrosis.  Discharge Planning: To Be Determined      Primary Diagnoses: Present on Admission: . IPF (idiopathic pulmonary fibrosis) ([place]) . Farmer's lung ([place]) . Acute on chronic respiratory failure ([place]) . HTN (hypertension) . Acute respiratory failure ([place])   I have reviewed the medical record, interviewed the patient  and family, and examined the patient. The following aspects are pertinent.  Past Medical History:  Diagnosis Date  . GERD (gastroesophageal reflux disease)   . Hyperglycemia     . Hypertension   . IPF (idiopathic pulmonary fibrosis) ([place]) 06/27/2016  . Pulmonary fibrosis ([place])   . Pulmonary hypertension due to interstitial lung disease ([place]) 07/13/2016   Social History   Social History  . Marital status: Married    Spouse name: N/A  . Number of children: N/A  . Years of education: N/A   Social History Main Topics  . Smoking status: Former Smoker    Packs/day: 2.00    Years: 40.00    Quit date: 05/03/2001  . Smokeless tobacco: Never Used  . Alcohol use No  . Drug use: No  . Sexual activity: Not Asked   Other Topics Concern  . None   Social History Narrative   Lives with others.   Unemployed      [facility] (07/26/2016):   No recent travel. No recent bird or mold exposure. No pets.    Family History  Problem Relation Age of Onset  . Heart attack Mother    Scheduled Meds: . albuterol  2.5 mg Nebulization Q6H  . budesonide (PULMICORT) nebulizer solution  0.25 mg Nebulization BID  . calcium-vitamin D  1 tablet Oral BID  . diltiazem  120 mg Oral Daily  . enoxaparin (LOVENOX) injection  40 mg Subcutaneous Q24H  . feeding supplement (GLUCERNA SHAKE)  237 mL Oral TID BM  . insulin aspart  0-9 Units Subcutaneous TID WC  . methylPREDNISolone (SOLU-MEDROL) injection  80 mg Intravenous Q6H  . pantoprazole  40 mg Oral Daily  . Pirfenidone  534 mg Oral TID   Continuous Infusions: PRN Meds:.acetaminophen **OR** acetaminophen, albuterol, ondansetron **OR** ondansetron (ZOFRAN) IV Allergies  Allergen Reactions  . Cyclobenzaprine Swelling and Other (See Comments)    Tongue and lips swell   Review of Systems  Constitutional: Positive for activity change, appetite change and fatigue.  HENT: Negative for congestion, facial swelling, hearing loss, sinus pressure, sneezing, sore throat and trouble swallowing.   Eyes: Negative for visual disturbance.  Respiratory: Positive for cough, chest tightness and shortness of breath.   Cardiovascular: Negative  for chest pain, palpitations and leg swelling.  Gastrointestinal: Negative for abdominal distention, abdominal pain and nausea.  Genitourinary: Negative for difficulty urinating.  Musculoskeletal: Negative for back pain.  Skin: Positive for pallor.  Neurological: Positive for weakness. Negative for dizziness and speech difficulty.  Psychiatric/Behavioral: Negative for agitation, confusion and sleep disturbance. The patient is not nervous/anxious.    Physical Exam  Constitutional: He is oriented to person, place, and time. He has a sickly appearance. Nasal cannula in place.  Frail and cachectic appearing man   HENT:  Head: Normocephalic and atraumatic.  Mouth/Throat: Oropharynx is clear and moist and mucous membranes are normal. Abnormal dentition. No oropharyngeal exudate.  Eyes: EOM are normal.  Neck: Normal range of motion. Neck supple.  Cardiovascular: Regular rhythm.  Tachycardia present.   Pulmonary/Chest:  Tachypnea. Incraesed WOB at rest, though able to hold a conversation. Poor air movement in lungs. Dry cough.   Abdominal: Soft. Bowel sounds are normal.  Musculoskeletal: Normal range of motion. He exhibits no edema.  Neurological: He is alert and oriented to person, place, and time.  Skin: Skin is warm and dry. There is pallor.  Psychiatric: He has a normal mood and affect. His behavior is normal. Judgment and thought content normal.  Vital Signs: BP 132/76 (BP Location: Left Arm)   Pulse (!) 111   Temp 97.9 F (36.6 C) (Oral)   Resp 18   Ht 5' 4.8" (1.646 m)   Wt 54.6 kg (120 lb 5.9 oz)   SpO2 96%   BMI 20.15 kg/m  Pain Assessment: No/denies pain   Pain Score: 0-No pain  SpO2: SpO2: 96 % O2 Device:SpO2: 96 % O2 Flow Rate: .O2 Flow Rate (L/min): 5 L/min  IO: Intake/output summary:  Intake/Output Summary (Last 24 hours) at 08/24/16 1418 Last data filed at 08/24/16 0701  Gross per 24 hour  Intake              517 ml  Output              826 ml  Net              -309 ml    LBM: Last BM Date: 08/23/16 Baseline Weight: Weight: 55.1 kg (121 lb 6.4 oz) Most recent weight: Weight: 54.6 kg (120 lb 5.9 oz)     Palliative Assessment/Data:    Time Total: 50 minutes Greater than 50%  of this time was spent counseling and coordinating care related to the above assessment and plan.  Signed by: Charlynn Court, NP Palliative Medicine Team Pager # [phone] (M-F 7a-5p) Team Phone # [phone] (Nights/Weekends)

## 2016-08-25 DIAGNOSIS — J670 Farmer's lung: Secondary | ICD-10-CM

## 2016-08-25 DIAGNOSIS — Z515 Encounter for palliative care: Secondary | ICD-10-CM

## 2016-08-25 LAB — CBC
HEMATOCRIT: 41.2 % (ref 39.0–52.0)
HEMOGLOBIN: 13.9 g/dL (ref 13.0–17.0)
MCH: 30.5 pg (ref 26.0–34.0)
MCHC: 33.7 g/dL (ref 30.0–36.0)
MCV: 90.4 fL (ref 78.0–100.0)
Platelets: 221 10*3/uL (ref 150–400)
RBC: 4.56 MIL/uL (ref 4.22–5.81)
RDW: 15.7 % — ABNORMAL HIGH (ref 11.5–15.5)
WBC: 16.1 10*3/uL — ABNORMAL HIGH (ref 4.0–10.5)

## 2016-08-25 LAB — COMPREHENSIVE METABOLIC PANEL
ALBUMIN: 2.5 g/dL — AB (ref 3.5–5.0)
ALK PHOS: 40 U/L (ref 38–126)
ALT: 24 U/L (ref 17–63)
AST: 22 U/L (ref 15–41)
Anion gap: 8 (ref 5–15)
BILIRUBIN TOTAL: 0.4 mg/dL (ref 0.3–1.2)
BUN: 22 mg/dL — AB (ref 6–20)
CALCIUM: 8.3 mg/dL — AB (ref 8.9–10.3)
CO2: 30 mmol/L (ref 22–32)
CREATININE: 0.89 mg/dL (ref 0.61–1.24)
Chloride: 99 mmol/L — ABNORMAL LOW (ref 101–111)
GFR calc Af Amer: >60 mL/min (ref >60)
GFR calc non Af Amer: >60 mL/min (ref >60)
GLUCOSE: 207 mg/dL — AB (ref 65–99)
Potassium: 4.5 mmol/L (ref 3.5–5.1)
Sodium: 137 mmol/L (ref 135–145)
TOTAL PROTEIN: 5.6 g/dL — AB (ref 6.5–8.1)

## 2016-08-25 LAB — TROPONIN I
Troponin I: 0.05 ng/mL (ref <0.03)
Troponin I: <0.03 ng/mL (ref <0.03)

## 2016-08-25 LAB — GLUCOSE, CAPILLARY
Glucose-Capillary: 166 mg/dL — ABNORMAL HIGH (ref 65–99)
Glucose-Capillary: 216 mg/dL — ABNORMAL HIGH (ref 65–99)
Glucose-Capillary: 201 mg/dL — ABNORMAL HIGH (ref 65–99)
Glucose-Capillary: 245 mg/dL — ABNORMAL HIGH (ref 65–99)

## 2016-08-25 MED ORDER — ALPRAZOLAM 0.25 MG PO TABS
0.2500 mg | ORAL_TABLET | Freq: Two times a day (BID) | ORAL | Status: DC | PRN
  Administered 2016-08-25 – 2016-08-27 (×3): 0.25 mg via ORAL
  Filled 2016-08-25 (×3): qty 1

## 2016-08-25 NOTE — Progress Notes (Signed)
Name: [name] MRN: [id number] DOB: Aug 02, 1945    ADMISSION DATE:  08/01/2016 CONSULTATION DATE:  07/29/2016   REFERRING MD :  Dr. Hal Hope   CHIEF COMPLAINT:  Hypoxia   HISTORY OF PRESENT ILLNESS:   71 year old male former smoker with PMH of IPF (followed by Dr. 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 Since discharge from hospital on 7/17 has required 5L [place] and 6L with activity.   Recent admission 7/10-7/17 for bronchitis vs PNA, treated with IV steroids and empiric antibiotics.   SIGNIFICANT EVENTS  7/30 > Presents to ED   STUDIES:  ECHO 05/15/16 > EF 60-65, G1DD, PA 56  CXR 7/30 > Stable marked prominence of the interstitial markings with diffuse honeycombing, cardiac silhouette remains borderline enlarged. Bi-apical pleural thickening is unchanged. Lower thoracic and upper lumbar spine degenerative changes. L1 vertebral compression deformity.  HRCT 8/1>>> 1. Progressive pulmonary fibrosis, as detailed above, considered a definite usual interstitial pneumonia (UIP) pattern. 2. There is also moderate centrilobular and paraseptal emphysema. 3. Aortic atherosclerosis.  SUBJECTIVE:  Pt. States he feels no better , about the same. Sats are 96% on 6 L [place], Supine in bed. Tachy per monitor  VITAL SIGNS: Temp:  [97.4 F (36.3 C)-98.4 F (36.9 C)] 98.4 F (36.9 C) (08/03 0822) Pulse Rate:  [97-114] 98 (08/03 0850) Resp:  [18-28] 25 (08/03 0850) BP: (124-158)/(71-89) 129/77 (08/03 0822) SpO2:  [91 %-100 %] 98 % (08/03 0850) Weight:  [116 lb 14.4 oz (53 kg)] 116 lb 14.4 oz (53 kg) (08/03 0353)  PHYSICAL EXAMINATION: General:  Adult male,  NAD, needs to use restroom, waiting for nurse  Neuro:  Alert, oriented, follows commands  HEENT:  Normocephalic  Cardiovascular:  RRR, no MRG Lungs:  resps even, shallow, tachypneic on [place], no distress, crackles  Abdomen:  Non-tender, active bowel sounds  Musculoskeletal:  -edema  Skin:  Warm, dry, intact    Recent Labs Lab 08/03/2016 0637 08/24/16 0404 08/25/16 0225  NA 139 140 137  K 3.9 5.1 4.5  CL 97* 99* 99*  CO2 34* 33* 30  BUN 21* 18 22*  CREATININE 0.91 1.01 0.89  GLUCOSE 134* 180* 207*    Recent Labs Lab 08/23/16 0934 08/24/16 0404 08/25/16 0225  HGB 14.4 13.6 13.9  HCT 43.0 40.5 41.2  WBC 15.6* 15.8* 16.1*  PLT 228 238 221   Ct Chest High Resolution  Result Date: 08/23/2016 CLINICAL DATA:  71 year old male with shortness of breath. Evaluate for interstitial lung disease. EXAM: CT CHEST WITHOUT CONTRAST TECHNIQUE: Multidetector CT imaging of the chest was performed following the standard protocol without intravenous contrast. High resolution imaging of the lungs, as well as inspiratory and expiratory imaging, was performed. COMPARISON:  Chest CT 05/13/2016. FINDINGS: Comment: Study is limited by considerable patient respiratory motion. Cardiovascular: Heart size is borderline enlarged. There is no significant pericardial fluid, thickening or pericardial calcification. Aortic atherosclerosis. No definite coronary artery calcifications are identified. Mediastinum/Nodes: Several borderline enlarged and mildly enlarged mediastinal and hilar lymph nodes are noted measuring up to 1 cm in short axis in the subcarinal nodal station. Esophagus is unremarkable in appearance. No axillary lymphadenopathy. Lungs/Pleura: High-resolution images demonstrate diffuse septal thickening, subpleural reticulation,  extensive cylindrical and varicose bronchiectasis, rather diffuse thickening of the peribronchovascular interstitium and scattered areas of parenchymal banding. These findings have a  definitive craniocaudal gradient, and have significantly worsened compared to prior study from 05/13/2016. Inspiratory and expiratory images are considered nondiagnostic due to patient motion and respiration. There is also a background of moderate centrilobular and paraseptal emphysema. No acute consolidative airspace disease. No pleural effusions. Upper Abdomen: Unremarkable. Musculoskeletal: There are no aggressive appearing lytic or blastic lesions noted in the visualized portions of the skeleton. IMPRESSION: 1. Progressive pulmonary fibrosis, as detailed above, considered a definite usual interstitial pneumonia (UIP) pattern. 2. There is also moderate centrilobular and paraseptal emphysema. 3. Aortic atherosclerosis. Aortic Atherosclerosis (ICD10-I70.0) and Emphysema (ICD10-J43.9). Electronically Signed   By: Vinnie Langton M.D.   On: 08/23/2016 16:31    ASSESSMENT / PLAN:  Acute on Chronic Hypoxic Respiratory Failure r/t of acute flare ILD (HP) vs disease progression - minimal improvement since yesterday.  Doubt infectious etiology.  ESR, CRP elevated. Significant progression of his IPF which high res CT confirms.  Not transplant candidate.  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 Chase Caller.( Scheduled)    Magdalen Spatz, AGACNP-BC [facility] 08/25/2016  11:24  AM Pager:  [phone]

## 2016-08-25 NOTE — Progress Notes (Signed)
PROGRESS NOTE   [name]  YWV:[id number]    DOB: March 22, 1945    DOA: 08/21/2016  PCP: Ladell Pier, MD   I have briefly reviewed patients previous medical records in [facility].  Brief Narrative:  71 year old Spanish-speaking male with PMH of pulmonary fibrosis/ILD (Dr. Chase Caller, Pulmonology), previous serum testing positive for [facility] and could be consistent with farmer's lung given patient's history of exposure, being treated with [name], is undergoing lung transplant evaluation at [facility], pulmonary hypertension, DM 2, HTN, hospitalized 7/10-7/17 with acute on chronic hypoxic respiratory failure when he had productive cough, pleurisy and exposure to sick contacts. He had been treated with steroid taper and broad-spectrum antibiotics. At recent discharge, he was on oxygen 4 L/m at rest and 6 L/m with activity. He now presented with 2 days history of worsening dyspnea, cough productive of yellow sputum, chest pain only on coughing, no fevers or chills. Pulmonology consulted.   Assessment & Plan:   Principal Problem:   Acute on chronic respiratory failure (HCC) Active Problems:   HTN (hypertension)   Farmer's lung (HCC)   IPF (idiopathic pulmonary fibrosis) (HCC)   Diabetes mellitus ([place])   Acute respiratory failure ([place])   Palliative care by specialist   1. Acute on chronic hypoxic respiratory failure: Secondary to ILD  Progression to very advanced stage , not candidate for Lung transplant,CT confirmed , still with Not much improvement ,steroids 80 mg IV Q8H , Nubs q6hprn ,Abx stopped per pulmonary , prognosis is poor , palliative approach . 2. 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 3. Essential hypertension: Controlled. Continue diltiazem. 4. Type II DM: Monitor closely while on steroids.  SSI. 5. Leukocytosis: Likely related to ongoing steroid use.  6. Anxiety : start low dose xanax .   DVT prophylaxis: Lovenox Code Status: Full Family Communication: Discussed with son at bedside. Disposition: DC home when medically improved.   Consultants:  Pulmonology   Procedures:  None  Antimicrobials:  Doxycycline    Subjective: Still with severe shortness of breath with minimal exertion , tachypneic, tachycardic , feels anxious about any movement as it causes him severe shortness of breath, had a long discussion with the family yesterday about his poor prognosis.    Objective:  Vitals:   08/25/16 0411 08/25/16 0758 08/25/16 0822 08/25/16 0850  BP:   129/77   Pulse:    98  Resp: (!) 24   (!) 25  Temp:   98.4 F (36.9 C)   TempSrc:   Oral   SpO2: 98% 100% 91% 98%  Weight:      Height:        Examination:  General exam: Pleasant middle-aged male, moderately built and nourished, sitting up comfortably in bed. Respiratory system: Severe decrease and air and try bilaterally with scattered wheezing and crackles . Cardiovascular system: S1 & S2 heard, RRR. No JVD, murmurs, rubs, gallops or clicks. No pedal edema. Telemetry: Sinus rhythm.  Gastrointestinal system: Abdomen is nondistended, soft and nontender. No organomegaly or masses felt. Normal bowel sounds heard. Central nervous system: Alert and oriented. No focal neurological deficits. Extremities: Symmetric 5 x 5 power. Skin: No rashes, lesions or ulcers Psychiatry: Judgement and insight appear normal. Mood & affect appropriate.     Data Reviewed: I have personally reviewed following labs and imaging studies  CBC:  Recent Labs Lab 08/21/16 2101 07/29/2016 0637 08/23/16  5784 08/24/16 0404 08/25/16 0225  WBC 16.8* 16.8* 15.6* 15.8* 16.1*  NEUTROABS  --  14.1*  --   --   --   HGB 14.7 14.1 14.4 13.6 13.9  HCT 44.7 42.0 43.0 40.5 41.2  MCV 91.4 91.1 90.5 90.6 90.4  PLT 273 238 228 238 696   Basic  Metabolic Panel:  Recent Labs Lab 08/21/16 2101 07/30/2016 0637 08/24/16 0404 08/25/16 0225  NA 135 139 140 137  K 4.1 3.9 5.1 4.5  CL 95* 97* 99* 99*  CO2 33* 34* 33* 30  GLUCOSE 159* 134* 180* 207*  BUN 23* 21* 18 22*  CREATININE 1.10 0.91 1.01 0.89  CALCIUM 8.9 8.8* 9.1 8.3*   CBG:  Recent Labs Lab 08/24/16 0825 08/24/16 1227 08/24/16 1700 08/24/16 2116 08/25/16 0734  GLUCAP 153* 166* 255* 150* 166*    Recent Results (from the past 240 hour(s))  Respiratory Panel by PCR     Status: None   Collection Time: 08/05/2016 12:50 PM  Result Value Ref Range Status   Adenovirus NOT DETECTED NOT DETECTED Final   Coronavirus 229E NOT DETECTED NOT DETECTED Final   Coronavirus HKU1 NOT DETECTED NOT DETECTED Final   Coronavirus NL63 NOT DETECTED NOT DETECTED Final   Coronavirus OC43 NOT DETECTED NOT DETECTED Final   Metapneumovirus NOT DETECTED NOT DETECTED Final   Rhinovirus / Enterovirus NOT DETECTED NOT DETECTED Final   Influenza A NOT DETECTED NOT DETECTED Final   Influenza B NOT DETECTED NOT DETECTED Final   Parainfluenza Virus 1 NOT DETECTED NOT DETECTED Final   Parainfluenza Virus 2 NOT DETECTED NOT DETECTED Final   Parainfluenza Virus 3 NOT DETECTED NOT DETECTED Final   Parainfluenza Virus 4 NOT DETECTED NOT DETECTED Final   Respiratory Syncytial Virus NOT DETECTED NOT DETECTED Final   Bordetella pertussis NOT DETECTED NOT DETECTED Final   Chlamydophila pneumoniae NOT DETECTED NOT DETECTED Final   Mycoplasma pneumoniae NOT DETECTED NOT DETECTED Final  MRSA PCR Screening     Status: None   Collection Time: 08/24/16  2:13 PM  Result Value Ref Range Status   MRSA by PCR NEGATIVE NEGATIVE Final    Comment:        The GeneXpert MRSA Assay (FDA approved for NASAL specimens only), is one component of a comprehensive MRSA colonization surveillance program. It is not intended to diagnose MRSA infection nor to guide or monitor treatment for MRSA infections.           Radiology Studies: Ct Chest High Resolution  Result Date: 08/23/2016 CLINICAL DATA:  71 year old male with shortness of breath. Evaluate for interstitial lung disease. EXAM: CT CHEST WITHOUT CONTRAST TECHNIQUE: Multidetector CT imaging of the chest was performed following the standard protocol without intravenous contrast. High resolution imaging of the lungs, as well as inspiratory and expiratory imaging, was performed. COMPARISON:  Chest CT 05/13/2016. FINDINGS: Comment: Study is limited by considerable patient respiratory motion. Cardiovascular: Heart size is borderline enlarged. There is no significant pericardial fluid, thickening or pericardial calcification. Aortic atherosclerosis. No definite coronary artery calcifications are identified. Mediastinum/Nodes: Several borderline enlarged and mildly enlarged mediastinal and hilar lymph nodes are noted measuring up to 1 cm in short axis in the subcarinal nodal station. Esophagus is unremarkable in appearance. No axillary lymphadenopathy. Lungs/Pleura: High-resolution images demonstrate diffuse septal thickening, subpleural reticulation, extensive cylindrical and varicose bronchiectasis, rather diffuse thickening of the peribronchovascular interstitium and scattered areas of parenchymal banding. These findings have a definitive craniocaudal gradient, and have significantly worsened compared  to prior study from 05/13/2016. Inspiratory and expiratory images are considered nondiagnostic due to patient motion and respiration. There is also a background of moderate centrilobular and paraseptal emphysema. No acute consolidative airspace disease. No pleural effusions. Upper Abdomen: Unremarkable. Musculoskeletal: There are no aggressive appearing lytic or blastic lesions noted in the visualized portions of the skeleton. IMPRESSION: 1. Progressive pulmonary fibrosis, as detailed above, considered a definite usual interstitial pneumonia (UIP) pattern. 2.  There is also moderate centrilobular and paraseptal emphysema. 3. Aortic atherosclerosis. Aortic Atherosclerosis (ICD10-I70.0) and Emphysema (ICD10-J43.9). Electronically Signed   By: Vinnie Langton M.D.   On: 08/23/2016 16:31        Scheduled Meds: . budesonide (PULMICORT) nebulizer solution  0.25 mg Nebulization BID  . calcium-vitamin D  1 tablet Oral BID  . diltiazem  120 mg Oral Daily  . enoxaparin (LOVENOX) injection  40 mg Subcutaneous Q24H  . feeding supplement (GLUCERNA SHAKE)  237 mL Oral TID BM  . insulin aspart  0-9 Units Subcutaneous TID WC  . levalbuterol  0.63 mg Nebulization TID  . methylPREDNISolone (SOLU-MEDROL) injection  80 mg Intravenous Q6H  . pantoprazole  40 mg Oral Daily  . Pirfenidone  534 mg Oral TID   Continuous Infusions:    LOS: 3 days     Waldron Session, MD, FACP, [facility]. [facility] Pager [phone]  If 7PM-7AM, please contact night-coverage [email or web address] Password [email or web address] 08/25/2016, 10:03 AM

## 2016-08-25 NOTE — Progress Notes (Signed)
Critical Troponin called to RN, [facility] covering page. Order received for repeat troponin at 8am.

## 2016-08-25 NOTE — Plan of Care (Signed)
Problem: Education: Goal: Knowledge of [facility] Education information/materials will improve Outcome: Progressing Plan of care able to be discussed with family and patient with translation.   Problem: Physical Regulation: Goal: Ability to maintain clinical measurements within normal limits will improve Outcome: Progressing Patient continues to be tachycardiac/tachypneic. Heart rate ranging from 90-100's and RR from 14-30's. Bedrest maintained other than standing weight. With exertion patient's HR and RR elevate with noted desaturation.

## 2016-08-25 NOTE — Progress Notes (Signed)
Daily Progress Note   Patient Name: [name]       Date: 08/25/2016 DOB: 27-Jan-1945  Age: 71 y.o. MRN#: [id number] Attending Physician: Waldron Session, MD Primary Care Physician: Ladell Pier, MD Admit Date: 08/19/2016  Reason for Consultation/Follow-up: Establishing goals of care and Psychosocial/spiritual support  Subjective: Family meeting scheduled today for 3 PM to share information regarding patient's current clinical status, disease trajectory. Multiple family members present both 3 sons and 2 daughters, 2 granddaughters as well as interpreter. Family has multiple questions including transplant eligibility, as well as transfer to [facility] in [facility]  Length of Stay: 3  Current Medications: Scheduled Meds:  . budesonide (PULMICORT) nebulizer solution  0.25 mg Nebulization BID  . calcium-vitamin D  1 tablet Oral BID  . diltiazem  120 mg Oral Daily  . enoxaparin (LOVENOX) injection  40 mg Subcutaneous Q24H  . feeding supplement (GLUCERNA SHAKE)  237 mL Oral TID BM  . insulin aspart  0-9 Units Subcutaneous TID WC  . levalbuterol  0.63 mg Nebulization TID  . methylPREDNISolone (SOLU-MEDROL) injection  80 mg Intravenous Q6H  . pantoprazole  40 mg Oral Daily  . Pirfenidone  534 mg Oral TID    Continuous Infusions:   PRN Meds: acetaminophen **OR** acetaminophen, ALPRAZolam, ipratropium, levalbuterol, ondansetron **OR** ondansetron (ZOFRAN) IV  Physical Exam  Constitutional: He is oriented to person, place, and time.  Cachectic, frail older man; short of breath at rest  HENT:  Head: Normocephalic and atraumatic.  Neck: Normal range of motion.  Cardiovascular:  Tachycardic  Pulmonary/Chest:  Increased work of breathing at rest    Musculoskeletal: Normal range of motion.  Neurological: He is alert and oriented to person, place, and time.  Skin: Skin is warm and dry.  Psychiatric:  Anxious  Nursing note and vitals reviewed.           Vital Signs: BP 127/74 (BP Location: Left Arm)   Pulse (!) 102   Temp 98.1 F (36.7 C) (Oral)   Resp (!) 25   Ht _0  (1.626 m)   Wt 53 kg (116 lb 14.4 oz)   SpO2 93%   BMI 20.07 kg/m  SpO2: SpO2: 93 % O2 Device: O2 Device: Nasal Cannula, High Flow Nasal Cannula O2 Flow Rate: O2 Flow Rate (L/min): 6  L/min  Intake/output summary:  Intake/Output Summary (Last 24 hours) at 08/25/16 1619 Last data filed at 08/25/16 0850  Gross per 24 hour  Intake              620 ml  Output             1050 ml  Net             -430 ml   LBM: Last BM Date: 08/24/16 Baseline Weight: Weight: 55.1 kg (121 lb 6.4 oz) Most recent weight: Weight: 53 kg (116 lb 14.4 oz)       Palliative Assessment/Data:      Patient Active Problem List   Diagnosis Date Noted  . Palliative care by specialist   . Acute respiratory failure ([place]) 08/06/2016  . Diabetes mellitus ([place]) 08/16/2016  . Malnutrition of moderate degree 08/05/2016  . Pulmonary fibrosis ([place]) 08/01/2016  . Acute on chronic respiratory failure ([place]) 08/01/2016  . Chronic respiratory failure with hypoxia/ noct desats 07/14/2016  . 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 IPF (idiopathic pulmonary fibrosis) ([place]) 06/27/2016  . Farmer's lung ([place]) 06/08/2016  . Acute respiratory distress 06/07/2016  . HCAP (healthcare-associated pneumonia) 06/07/2016  . Sepsis ([place]) 06/07/2016  . HTN (hypertension) 06/07/2016  . Anemia   . Hyperglycemia   . Chest pain on breathing   . Pneumomediastinum ([place])   . Protein-calorie malnutrition, severe 05/15/2016  . Pneumonitis   . Interstitial lung disease ([place]) 05/13/2016  . ILD (interstitial lung disease) ([place]) 05/12/2016  . Weight loss, abnormal 05/12/2016   . Chest pain 05/12/2016  . Sinus tachycardia 05/12/2016  . Dyspnea and respiratory abnormalities 05/12/2016    Palliative Care Assessment & Plan   Patient Profile: 71 year old male former smoker with PMH of IPF (followed by Dr. 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 Since discharge from hospital on 7/17 has required 5L [place] and 6L with activity.   Recent admission 7/10-7/17 for bronchitis vs PNA, treated with IV steroids and empiric antibiotics.   Assessment: Met with patient and multiple family members as noted above. Despite previous conversations regarding his worsening pulmonary fibrosis, patient and family are struggling with this news. They are asking again regarding transplant eligibility. I explained that patient's disease has progressed rapidly based on CT findings of this admission as compared to just April 2018, and that his pulmonologist did not feel that he would be eligible for transplant based on this clinical decline as well as insurance issues. I did page attending, Dr. Burnis Medin, who  joined [place] at the bedside. They are requesting that he be transferred to [facility] in [facility] for a second opinion.  Patient's 2 granddaughters who speak English did meet me outside of the room to ask about prognosis. I did share that it was my opinion he met hospice criteria for a prognosis of less than 6 months. They have requested that I not share this information with the patient or his children  Recommendations/Plan:  Dr. Burnis Medin agreed to try to facilitate a transfer. I also attempted to explain to family that all we can do is try, that we  need to have an accepting physician  at [facility]  Unfortunately I was not able to address goals of care concerns such as CODE STATUS, hospice services, as patient and family are requesting a second opinion before proceeding with palliative/comfort approach. As noted above, his 2 granddaughters did speak to me outside of the room and ask about prognosis. I explained to them that in my opinion,  he would meet hospice criteria for in-home care. I attempted to explain how according to Medicare research, "if things were continued to go as they are now, I would not be surprised if he had a prognosis of less than 6 months"; one granddaughter stated she would not be sur[rised, and actually thought he might have less time  Goals of Care and Additional Recommendations:  Limitations on Scope of Treatment: Full Scope Treatment  Code Status:    Code Status Orders        Start     Ordered   07/26/2016 0609  Full code  Continuous     08/03/2016 0610    Code Status History    Date Active Date Inactive Code Status Order ID Comments User Context   08/01/2016 11:44 AM 08/08/2016  7:29 PM Full Code [id number]  Corey Harold, NP ED   05/13/2016  8:53 PM 05/17/2016  5:33 PM Full Code [id number]  Oswald Hillock, MD Inpatient       Prognosis:   < 6 months in the setting of progressive interstitial lung disease  Discharge Planning:  To Be Determined  Care plan was discussed with Dr. Burnis Medin  Thank you for allowing the Palliative Medicine Team to assist in the care of this patient.   Time In: 1500 Time Out: 1615 Total Time 75 min Prolonged Time Billed  yes       Greater than 50%  of this time was spent counseling and coordinating care related to the above assessment and plan.  Dory Horn, NP  Please contact Palliative Medicine Team phone at [phone] for questions and concerns.

## 2016-08-26 LAB — GLUCOSE, CAPILLARY
GLUCOSE-CAPILLARY: 245 mg/dL — AB (ref 65–99)
GLUCOSE-CAPILLARY: 170 mg/dL — AB (ref 65–99)
Glucose-Capillary: 144 mg/dL — ABNORMAL HIGH (ref 65–99)
Glucose-Capillary: 273 mg/dL — ABNORMAL HIGH (ref 65–99)

## 2016-08-26 LAB — PROCALCITONIN: Procalcitonin: <0.1 ng/mL

## 2016-08-26 MED ORDER — LEVALBUTEROL HCL 0.63 MG/3ML IN NEBU: 0.6300 mg | INHALATION_SOLUTION | Freq: Four times a day (QID) | RESPIRATORY_TRACT | Status: DC | PRN

## 2016-08-26 MED ORDER — IPRATROPIUM BROMIDE 0.02 % IN SOLN
0.5000 mg | Freq: Four times a day (QID) | RESPIRATORY_TRACT | Status: DC | PRN
  Administered 2016-08-31: 0.5 mg via RESPIRATORY_TRACT
  Filled 2016-08-26 (×2): qty 2.5

## 2016-08-26 NOTE — Progress Notes (Signed)
PROGRESS NOTE   [name]  JXB:[id number]    DOB: [birth year]    DOA: 08/17/2016  PCP: Ladell Pier, MD   I have briefly reviewed patients previous medical records in [facility].  Brief Narrative:  71 year old Spanish-speaking male with PMH of pulmonary fibrosis/ILD (Dr. Chase Caller, Pulmonology), previous serum testing positive for [facility] and could be consistent with farmer's lung given patient's history of exposure, being treated with [name], is undergoing lung transplant evaluation at [facility], pulmonary hypertension, DM 2, HTN, hospitalized 7/10-7/17 with acute on chronic hypoxic respiratory failure when he had productive cough, pleurisy and exposure to sick contacts. He had been treated with steroid taper and broad-spectrum antibiotics. At recent discharge, he was on oxygen 4 L/m at rest and 6 L/m with activity. He now presented with 2 days history of worsening dyspnea, cough productive of yellow sputum, chest pain only on coughing, no fevers or chills. Pulmonology consulted.   Assessment & Plan:   Principal Problem:   Acute on chronic respiratory failure (HCC) Active Problems:   HTN (hypertension)   Farmer's lung (HCC)   IPF (idiopathic pulmonary fibrosis) (HCC)   Diabetes mellitus ([place])   Acute respiratory failure ([place])   Palliative care by specialist   1. Acute on chronic hypoxic respiratory failure: No improvement , Secondary to ILD  Progression to very advanced stage , not candidate for Lung transplant,CT confirmed , still with Not much improvement ,steroids 80 mg IV Q6H , Nubs q6hprn ,Abx stopped per pulmonary , prognosis is poor , palliative approach family refused and wanted second input , I called [facility] who accepted the transfer but no beds available , tp the patient to tele floor . 2. 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 3. Essential hypertension: Controlled. Continue diltiazem. 4. Type II DM: Monitor closely while on steroids. SSI. 5. Leukocytosis: Likely related to ongoing steroid use.  6. Anxiety : start low dose xanax .   DVT prophylaxis: Lovenox Code Status: Full Family Communication: Discussed with son at bedside. Disposition: DC home when medically improved.   Consultants:  Pulmonology   Procedures:  None  Antimicrobials:  Doxycycline    Subjective: No change in his situation , Still with severe shortness of breath with minimal exertion , tachypneic, tachycardic , feels anxious about any movement as it causes him severe shortness of breath, I had another family meeting yesterday in the presents of the palliative care team about his poor prognosis family still struggling with denial of his advanced disease, they wanted me to transfer him to [facility] for second opinion.   Objective:  Vitals:   08/26/16 0048 08/26/16 0511 08/26/16 0835 08/26/16 0900  BP: 133/77 128/77  132/69  Pulse: 88 80    Resp: (!) 31 (!) 27    Temp: 98 F (36.7 C) 98 F (36.7 C)  98.1 F (36.7 C)  TempSrc:    Oral  SpO2: 97% 97% 97% 97%  Weight:  53.5 kg (118 lb)    Height:        Examination:  General exam: NAD. Respiratory system: Severe decrease and air and try bilaterally with scattered wheezing and crackles . Cardiovascular system: S1 & S2 heard, RRR. No JVD, murmurs, rubs, gallops or clicks. No pedal edema. Telemetry: Sinus rhythm.  Gastrointestinal system: Abdomen is nondistended, soft and nontender. No organomegaly or masses felt. Normal bowel  sounds heard. Central nervous system: Alert and oriented. No focal neurological deficits. Extremities: Symmetric 5 x 5 power. Skin: No rashes, lesions or ulcers Psychiatry: Judgement and insight appear normal. Mood & affect appropriate.     Data Reviewed: I have personally reviewed following labs and  imaging studies  CBC:  Recent Labs Lab 08/21/16 2101 07/25/2016 0637 08/23/16 0934 08/24/16 0404 08/25/16 0225  WBC 16.8* 16.8* 15.6* 15.8* 16.1*  NEUTROABS  --  14.1*  --   --   --   HGB 14.7 14.1 14.4 13.6 13.9  HCT 44.7 42.0 43.0 40.5 41.2  MCV 91.4 91.1 90.5 90.6 90.4  PLT 273 238 228 238 147   Basic Metabolic Panel:  Recent Labs Lab 08/21/16 2101 08/19/2016 0637 08/24/16 0404 08/25/16 0225  NA 135 139 140 137  K 4.1 3.9 5.1 4.5  CL 95* 97* 99* 99*  CO2 33* 34* 33* 30  GLUCOSE 159* 134* 180* 207*  BUN 23* 21* 18 22*  CREATININE 1.10 0.91 1.01 0.89  CALCIUM 8.9 8.8* 9.1 8.3*   CBG:  Recent Labs Lab 08/25/16 0734 08/25/16 1124 08/25/16 1608 08/25/16 2044 08/26/16 0838  GLUCAP 166* 216* 201* 245* 245*    Recent Results (from the past 240 hour(s))  Respiratory Panel by PCR     Status: None   Collection Time: 08/21/2016 12:50 PM  Result Value Ref Range Status   Adenovirus NOT DETECTED NOT DETECTED Final   Coronavirus 229E NOT DETECTED NOT DETECTED Final   Coronavirus HKU1 NOT DETECTED NOT DETECTED Final   Coronavirus NL63 NOT DETECTED NOT DETECTED Final   Coronavirus OC43 NOT DETECTED NOT DETECTED Final   Metapneumovirus NOT DETECTED NOT DETECTED Final   Rhinovirus / Enterovirus NOT DETECTED NOT DETECTED Final   Influenza A NOT DETECTED NOT DETECTED Final   Influenza B NOT DETECTED NOT DETECTED Final   Parainfluenza Virus 1 NOT DETECTED NOT DETECTED Final   Parainfluenza Virus 2 NOT DETECTED NOT DETECTED Final   Parainfluenza Virus 3 NOT DETECTED NOT DETECTED Final   Parainfluenza Virus 4 NOT DETECTED NOT DETECTED Final   Respiratory Syncytial Virus NOT DETECTED NOT DETECTED Final   Bordetella pertussis NOT DETECTED NOT DETECTED Final   Chlamydophila pneumoniae NOT DETECTED NOT DETECTED Final   Mycoplasma pneumoniae NOT DETECTED NOT DETECTED Final  MRSA PCR Screening     Status: None   Collection Time: 08/24/16  2:13 PM  Result Value Ref Range Status    MRSA by PCR NEGATIVE NEGATIVE Final    Comment:        The GeneXpert MRSA Assay (FDA approved for NASAL specimens only), is one component of a comprehensive MRSA colonization surveillance program. It is not intended to diagnose MRSA infection nor to guide or monitor treatment for MRSA infections.          Radiology Studies: No results found.      Scheduled Meds: . budesonide (PULMICORT) nebulizer solution  0.25 mg Nebulization BID  . calcium-vitamin D  1 tablet Oral BID  . diltiazem  120 mg Oral Daily  . enoxaparin (LOVENOX) injection  40 mg Subcutaneous Q24H  . feeding supplement (GLUCERNA SHAKE)  237 mL Oral TID BM  . insulin aspart  0-9 Units Subcutaneous TID WC  . levalbuterol  0.63 mg Nebulization TID  . methylPREDNISolone (SOLU-MEDROL) injection  80 mg Intravenous Q6H  . pantoprazole  40 mg Oral Daily  . Pirfenidone  534 mg Oral TID   Continuous Infusions:    LOS: 4  days     Waldron Session, MD, FACP, [facility]. [facility] Pager [phone]  If 7PM-7AM, please contact night-coverage [email or web address] Password [email or web address] 08/26/2016, 10:25 AM

## 2016-08-27 ENCOUNTER — Inpatient Hospital Stay (HOSPITAL_COMMUNITY): Payer: Medicaid Other

## 2016-08-27 LAB — CBC
HEMATOCRIT: 39.4 % (ref 39.0–52.0)
HEMOGLOBIN: 13 g/dL (ref 13.0–17.0)
MCH: 29.7 pg (ref 26.0–34.0)
MCHC: 33 g/dL (ref 30.0–36.0)
MCV: 90.2 fL (ref 78.0–100.0)
Platelets: 230 10*3/uL (ref 150–400)
RBC: 4.37 MIL/uL (ref 4.22–5.81)
RDW: 14.8 % (ref 11.5–15.5)
WBC: 16.6 10*3/uL — AB (ref 4.0–10.5)

## 2016-08-27 LAB — COMPREHENSIVE METABOLIC PANEL
ALBUMIN: 2.4 g/dL — AB (ref 3.5–5.0)
ALT: 28 U/L (ref 17–63)
AST: 20 U/L (ref 15–41)
Alkaline Phosphatase: 43 U/L (ref 38–126)
Anion gap: 7 (ref 5–15)
BUN: 24 mg/dL — AB (ref 6–20)
CHLORIDE: 97 mmol/L — AB (ref 101–111)
CO2: 32 mmol/L (ref 22–32)
Calcium: 8.3 mg/dL — ABNORMAL LOW (ref 8.9–10.3)
Creatinine, Ser: 0.92 mg/dL (ref 0.61–1.24)
GFR calc Af Amer: >60 mL/min (ref >60)
Glucose, Bld: 223 mg/dL — ABNORMAL HIGH (ref 65–99)
POTASSIUM: 4.1 mmol/L (ref 3.5–5.1)
Sodium: 136 mmol/L (ref 135–145)
Total Bilirubin: 0.6 mg/dL (ref 0.3–1.2)
Total Protein: 5.1 g/dL — ABNORMAL LOW (ref 6.5–8.1)

## 2016-08-27 LAB — BRAIN NATRIURETIC PEPTIDE: B Natriuretic Peptide: 32.5 pg/mL (ref 0.0–100.0)

## 2016-08-27 LAB — GLUCOSE, CAPILLARY
GLUCOSE-CAPILLARY: 191 mg/dL — AB (ref 65–99)
GLUCOSE-CAPILLARY: 322 mg/dL — AB (ref 65–99)
GLUCOSE-CAPILLARY: 216 mg/dL — AB (ref 65–99)
GLUCOSE-CAPILLARY: 281 mg/dL — AB (ref 65–99)

## 2016-08-27 MED ORDER — FUROSEMIDE 10 MG/ML IJ SOLN
20.0000 mg | Freq: Once | INTRAMUSCULAR | Status: AC
  Administered 2016-08-27: 20 mg via INTRAVENOUS
  Filled 2016-08-27: qty 2

## 2016-08-27 MED ORDER — METHYLPREDNISOLONE SODIUM SUCC 125 MG IJ SOLR
125.0000 mg | Freq: Four times a day (QID) | INTRAMUSCULAR | Status: DC
  Administered 2016-08-27 – 2016-08-30 (×12): 125 mg via INTRAVENOUS
  Filled 2016-08-27 (×13): qty 2

## 2016-08-27 MED ORDER — ALPRAZOLAM 0.25 MG PO TABS
0.2500 mg | ORAL_TABLET | Freq: Three times a day (TID) | ORAL | Status: DC | PRN
  Administered 2016-08-27 – 2016-08-31 (×9): 0.25 mg via ORAL
  Filled 2016-08-27 (×9): qty 1

## 2016-08-27 NOTE — Progress Notes (Signed)
PROGRESS NOTE   [name]  GYJ:[id number]    DOB: 10/15/1945    DOA: 08/19/2016  PCP: Ladell Pier, MD   I have briefly reviewed patients previous medical records in [facility].  Brief Narrative:  71 year old Spanish-speaking male with PMH of pulmonary fibrosis/ILD (Dr. Chase Caller, Pulmonology), previous serum testing positive for [facility] and could be consistent with farmer's lung given patient's history of exposure, being treated with [name], is undergoing lung transplant evaluation at [facility], pulmonary hypertension, DM 2, HTN, hospitalized 7/10-7/17 with acute on chronic hypoxic respiratory failure when he had productive cough, pleurisy and exposure to sick contacts. He had been treated with steroid taper and broad-spectrum antibiotics. At recent discharge, he was on oxygen 4 L/m at rest and 6 L/m with activity. He now presented with 2 days history of worsening dyspnea, cough productive of yellow sputum, chest pain only on coughing, no fevers or chills. Pulmonology consulted.   Assessment & Plan:   Principal Problem:   Acute on chronic respiratory failure (HCC) Active Problems:   HTN (hypertension)   Farmer's lung (HCC)   IPF (idiopathic pulmonary fibrosis) (HCC)   Diabetes mellitus ([place])   Acute respiratory failure ([place])   Palliative care by specialist   1. Acute on chronic hypoxic respiratory failure: No improvement , Secondary to ILD  Progression to very advanced stage , not candidate for Lung transplant,CT confirmed , still with Not much improvement ,steroids 125 mg IV Q6H , Nubs q6hprn ,Abx stopped per pulmonary , prognosis is poor , palliative approach family refused and wanted second input , I called [facility] who accepted the transfer but no beds available, pending transfer, will change the o2 to mask as he is mouth Breather . 2. 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 3. Essential hypertension: Controlled. Continue diltiazem. 4. Type II DM: Monitor closely while on steroids. SSI. 5. Leukocytosis: Likely related to ongoing steroid use.  6. Anxiety : start low dose xanax .   DVT prophylaxis: Lovenox Code Status: Full Family Communication: Discussed with son at bedside. Disposition: DC home when medically improved.   Consultants:  Pulmonology   Procedures:  None  Antimicrobials:  Doxycycline    Subjective: Same as before with sever SOB with minimal exertion , denies any fevers or chills , nausea or vomiting .  Objective:  Vitals:   08/27/16 0512 08/27/16 0804 08/27/16 0859 08/27/16 1201  BP: 130/70 134/73  126/68  Pulse: 87 97  99  Resp: (!) 26 18  (!) 33  Temp: 98.5 F (36.9 C) 97.6 F (36.4 C)  98 F (36.7 C)  TempSrc:  Oral  Oral  SpO2: 97% 91% 92% 92%  Weight: 53.7 kg (118 lb 4.8 oz)     Height:        Examination:  General exam: NAD. Respiratory system: Severe decrease and air and try bilaterally with scattered wheezing and crackles . Cardiovascular system: S1 & S2 heard, RRR. No JVD, murmurs, rubs, gallops or clicks. No pedal edema. Telemetry: Sinus rhythm.  Gastrointestinal system: Abdomen is nondistended, soft and nontender. No organomegaly or masses felt. Normal bowel sounds heard. Central nervous system: Alert and oriented. No focal neurological deficits. Extremities: Symmetric 5 x 5 power. Skin: No rashes, lesions or ulcers Psychiatry: Judgement and insight appear normal. Mood & affect appropriate.     Data Reviewed: I have personally  reviewed following labs and imaging studies  CBC:  Recent Labs Lab 08/05/2016 0637 08/23/16 0934 08/24/16 0404 08/25/16 0225 08/27/16 0149  WBC 16.8* 15.6* 15.8* 16.1* 16.6*  NEUTROABS 14.1*  --   --   --   --   HGB 14.1 14.4 13.6 13.9 13.0  HCT 42.0 43.0 40.5 41.2 39.4  MCV 91.1  90.5 90.6 90.4 90.2  PLT 238 228 238 221 737   Basic Metabolic Panel:  Recent Labs Lab 08/21/16 2101 07/31/2016 0637 08/24/16 0404 08/25/16 0225 08/27/16 0149  NA 135 139 140 137 136  K 4.1 3.9 5.1 4.5 4.1  CL 95* 97* 99* 99* 97*  CO2 33* 34* 33* 30 32  GLUCOSE 159* 134* 180* 207* 223*  BUN 23* 21* 18 22* 24*  CREATININE 1.10 0.91 1.01 0.89 0.92  CALCIUM 8.9 8.8* 9.1 8.3* 8.3*   CBG:  Recent Labs Lab 08/26/16 1132 08/26/16 1632 08/26/16 2256 08/27/16 0802 08/27/16 1200  GLUCAP 170* 144* 273* 191* 322*    Recent Results (from the past 240 hour(s))  Respiratory Panel by PCR     Status: None   Collection Time: 08/21/2016 12:50 PM  Result Value Ref Range Status   Adenovirus NOT DETECTED NOT DETECTED Final   Coronavirus 229E NOT DETECTED NOT DETECTED Final   Coronavirus HKU1 NOT DETECTED NOT DETECTED Final   Coronavirus NL63 NOT DETECTED NOT DETECTED Final   Coronavirus OC43 NOT DETECTED NOT DETECTED Final   Metapneumovirus NOT DETECTED NOT DETECTED Final   Rhinovirus / Enterovirus NOT DETECTED NOT DETECTED Final   Influenza A NOT DETECTED NOT DETECTED Final   Influenza B NOT DETECTED NOT DETECTED Final   Parainfluenza Virus 1 NOT DETECTED NOT DETECTED Final   Parainfluenza Virus 2 NOT DETECTED NOT DETECTED Final   Parainfluenza Virus 3 NOT DETECTED NOT DETECTED Final   Parainfluenza Virus 4 NOT DETECTED NOT DETECTED Final   Respiratory Syncytial Virus NOT DETECTED NOT DETECTED Final   Bordetella pertussis NOT DETECTED NOT DETECTED Final   Chlamydophila pneumoniae NOT DETECTED NOT DETECTED Final   Mycoplasma pneumoniae NOT DETECTED NOT DETECTED Final  MRSA PCR Screening     Status: None   Collection Time: 08/24/16  2:13 PM  Result Value Ref Range Status   MRSA by PCR NEGATIVE NEGATIVE Final    Comment:        The GeneXpert MRSA Assay (FDA approved for NASAL specimens only), is one component of a comprehensive MRSA colonization surveillance program. It is  not intended to diagnose MRSA infection nor to guide or monitor treatment for MRSA infections.          Radiology Studies: No results found.      Scheduled Meds: . budesonide (PULMICORT) nebulizer solution  0.25 mg Nebulization BID  . calcium-vitamin D  1 tablet Oral BID  . diltiazem  120 mg Oral Daily  . enoxaparin (LOVENOX) injection  40 mg Subcutaneous Q24H  . feeding supplement (GLUCERNA SHAKE)  237 mL Oral TID BM  . furosemide  20 mg Intravenous Once  . insulin aspart  0-9 Units Subcutaneous TID WC  . levalbuterol  0.63 mg Nebulization TID  . methylPREDNISolone (SOLU-MEDROL) injection  125 mg Intravenous Q6H  . pantoprazole  40 mg Oral Daily  . Pirfenidone  534 mg Oral TID   Continuous Infusions:    LOS: 5 days     Waldron Session, MD, FACP, [facility]. [facility] Pager [phone]  If 7PM-7AM, please contact night-coverage [email or web address] Password [facility] 08/27/2016,  1:53 PM

## 2016-08-27 NOTE — Progress Notes (Signed)
Dr. Rosanna Randy O2 mask for pt.  Placed on 8L 40% venti

## 2016-08-28 ENCOUNTER — Telehealth: Payer: Self-pay

## 2016-08-28 DIAGNOSIS — J9601 Acute respiratory failure with hypoxia: Secondary | ICD-10-CM

## 2016-08-28 LAB — GLUCOSE, CAPILLARY
GLUCOSE-CAPILLARY: 206 mg/dL — AB (ref 65–99)
GLUCOSE-CAPILLARY: 244 mg/dL — AB (ref 65–99)
Glucose-Capillary: 307 mg/dL — ABNORMAL HIGH (ref 65–99)
Glucose-Capillary: 256 mg/dL — ABNORMAL HIGH (ref 65–99)

## 2016-08-28 MED ORDER — LEVALBUTEROL HCL 0.63 MG/3ML IN NEBU: 0.6300 mg | INHALATION_SOLUTION | Freq: Four times a day (QID) | RESPIRATORY_TRACT | Status: DC | PRN

## 2016-08-28 MED ORDER — LEVALBUTEROL HCL 0.63 MG/3ML IN NEBU
0.6300 mg | INHALATION_SOLUTION | Freq: Three times a day (TID) | RESPIRATORY_TRACT | Status: DC
  Administered 2016-08-28 – 2016-08-29 (×3): 0.63 mg via RESPIRATORY_TRACT
  Filled 2016-08-28 (×3): qty 3

## 2016-08-28 NOTE — Progress Notes (Addendum)
Inpatient Diabetes Program Recommendations  AACE/ADA: New Consensus Statement on Inpatient Glycemic Control (2015)  Target Ranges:  Prepandial:   less than 140 mg/dL      Peak postprandial:   less than 180 mg/dL (1-2 hours)      Critically ill patients:  140 - 180 mg/dL   Results for [name], [name] (MRN [id number]) as of 08/28/2016 12:31  Ref. Range 08/27/2016 08:02 08/27/2016 12:00 08/27/2016 17:35 08/27/2016 21:24  Glucose-Capillary Latest Ref Range: 65 - 99 mg/dL 191 (H) 322 (H) 216 (H) 281 (H)   Results for [name], [name] (MRN [id number]) as of 08/28/2016 12:31  Ref. Range 08/28/2016 07:35 08/28/2016 11:38  Glucose-Capillary Latest Ref Range: 65 - 99 mg/dL 307 (H) 256 (H)    Home DM Meds: Metformin 500 mg BID  Current Insulin Orders: Novolog Sensitive Correction Scale/ SSI (0-9 units) TID AC       MD- Note patient getting Solumedrol 125 mg Q6 hours.  CBGs severely elevated today.  Please consider the following adjustments while patient awaiting bed transfer to [facility] and if within goals of care for this patient:  1. Start Lantus 8 units daily (0.15 units/kg dosing based on weight of 53 kg)  2. Start Novolog Meal Coverage: Novolog 3 units TID with meals (hold if pt eats <50% of meal)      --Will follow patient during hospitalization--  Wyn Quaker RN, MSN, CDE Diabetes Coordinator Inpatient Glycemic Control Team Team Pager: [phone] (8a-5p)

## 2016-08-28 NOTE — Telephone Encounter (Signed)
The patient has been followed by the [facility]. His appointment for 08/30/16 has been cancelled due to his hospitalization. Will reschedule as discharge plans are determined.

## 2016-08-28 NOTE — Plan of Care (Signed)
Problem: Health Behavior/Discharge Planning: Goal: Ability to manage health-related needs will improve Outcome: Not Progressing Family questioning progress of requested transfer to [facility]. Will address with team this morning.   Problem: Physical Regulation: Goal: Ability to maintain clinical measurements within normal limits will improve Outcome: Not Progressing Patient still requiring extra O2 through the venti mask. PRN Xanax given which has assisted with patients work of breath. Steroids and breathing treatments given per eMAR.

## 2016-08-28 NOTE — Progress Notes (Signed)
PROGRESS NOTE   [name]  IHK:[id number]    DOB: 1945/03/21    DOA: 08/13/2016  PCP: Ladell Pier, MD   I have briefly reviewed patients previous medical records in [facility].  Brief Narrative:  71 year old Spanish-speaking male with PMH of pulmonary fibrosis/ILD (Dr. Chase Caller, Pulmonology), previous serum testing positive for [facility] and could be consistent with farmer's lung given patient's history of exposure, being treated with [name], is undergoing lung transplant evaluation at [facility], pulmonary hypertension, DM 2, HTN, hospitalized 7/10-7/17 with acute on chronic hypoxic respiratory failure when he had productive cough, pleurisy and exposure to sick contacts. He had been treated with steroid taper and broad-spectrum antibiotics. At recent discharge, he was on oxygen 4 L/m at rest and 6 L/m with activity. He now presented with 2 days history of worsening dyspnea, cough productive of yellow sputum, chest pain only on coughing, no fevers or chills. Pulmonology consulted.   Assessment & Plan:   Principal Problem:   Acute on chronic respiratory failure (HCC) Active Problems:   HTN (hypertension)   Farmer's lung (HCC)   IPF (idiopathic pulmonary fibrosis) (HCC)   Diabetes mellitus ([place])   Acute respiratory failure ([place])   Palliative care by specialist   1. Acute on chronic hypoxic respiratory failure: No improvement , Secondary to ILD  Progression to very advanced stage ,CT confirmed  , not candidate for Lung transplant,, still with Not much improvement ,steroids 125 mg IV Q6H , Nubs q6hprn ,Abx stopped per pulmonary , prognosis is poor , palliative approach family refused and wanted second input , I called [facility] who accepted the transfer but no beds available, pending transfer,i will call them again . 2. 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 3. Essential hypertension: Controlled. Continue diltiazem. 4. Type II DM: Monitor closely while on steroids. SSI. 5. Leukocytosis: Likely related to ongoing steroid use.  6. Anxiety : start low dose xanax .   DVT prophylaxis: Lovenox Code Status: Full Family Communication: Discussed with son at bedside. Disposition: DC home when medically improved.   Consultants:  Pulmonology   Procedures:  None  Antimicrobials:  Doxycycline    Subjective:   Feels little better , no issues overnight , slept well.  Objective:  Vitals:   08/28/16 0406 08/28/16 0801 08/28/16 0808 08/28/16 0809  BP: 130/72 123/79    Pulse: 87 94    Resp: (!) 24 (!) 22    Temp: 97.7 F (36.5 C) 97.6 F (36.4 C)    TempSrc: Axillary Axillary    SpO2: 93% 98% 94% 94%  Weight: 53.3 kg (117 lb 6.4 oz)     Height:        Examination:  General exam: NAD. Respiratory system: Severe decrease and air and try bilaterally with scattered wheezing and crackles . Cardiovascular system: S1 & S2 heard, RRR. No JVD, murmurs, rubs, gallops or clicks. No pedal edema. Telemetry: Sinus rhythm.  Gastrointestinal system: Abdomen is nondistended, soft and nontender. No organomegaly or masses felt. Normal bowel sounds heard. Central nervous system: Alert and oriented. No focal neurological deficits. Extremities: Symmetric 5 x 5 power. Skin: No rashes, lesions or ulcers Psychiatry: Judgement and insight appear normal. Mood & affect appropriate.     Data Reviewed: I have personally reviewed following labs and imaging studies  CBC:  Recent Labs Lab 08/04/2016 0637 08/23/16 0934 08/24/16  0404 08/25/16 0225 08/27/16 0149  WBC 16.8* 15.6* 15.8* 16.1* 16.6*  NEUTROABS 14.1*  --   --   --   --   HGB 14.1 14.4 13.6 13.9 13.0  HCT 42.0 43.0 40.5 41.2 39.4  MCV 91.1 90.5 90.6 90.4 90.2  PLT 238 228 238 221 295   Basic Metabolic Panel:  Recent Labs Lab  08/21/16 2101 08/04/2016 0637 08/24/16 0404 08/25/16 0225 08/27/16 0149  NA 135 139 140 137 136  K 4.1 3.9 5.1 4.5 4.1  CL 95* 97* 99* 99* 97*  CO2 33* 34* 33* 30 32  GLUCOSE 159* 134* 180* 207* 223*  BUN 23* 21* 18 22* 24*  CREATININE 1.10 0.91 1.01 0.89 0.92  CALCIUM 8.9 8.8* 9.1 8.3* 8.3*   CBG:  Recent Labs Lab 08/27/16 0802 08/27/16 1200 08/27/16 1735 08/27/16 2124 08/28/16 0735  GLUCAP 191* 322* 216* 281* 307*    Recent Results (from the past 240 hour(s))  Respiratory Panel by PCR     Status: None   Collection Time: 08/11/2016 12:50 PM  Result Value Ref Range Status   Adenovirus NOT DETECTED NOT DETECTED Final   Coronavirus 229E NOT DETECTED NOT DETECTED Final   Coronavirus HKU1 NOT DETECTED NOT DETECTED Final   Coronavirus NL63 NOT DETECTED NOT DETECTED Final   Coronavirus OC43 NOT DETECTED NOT DETECTED Final   Metapneumovirus NOT DETECTED NOT DETECTED Final   Rhinovirus / Enterovirus NOT DETECTED NOT DETECTED Final   Influenza A NOT DETECTED NOT DETECTED Final   Influenza B NOT DETECTED NOT DETECTED Final   Parainfluenza Virus 1 NOT DETECTED NOT DETECTED Final   Parainfluenza Virus 2 NOT DETECTED NOT DETECTED Final   Parainfluenza Virus 3 NOT DETECTED NOT DETECTED Final   Parainfluenza Virus 4 NOT DETECTED NOT DETECTED Final   Respiratory Syncytial Virus NOT DETECTED NOT DETECTED Final   Bordetella pertussis NOT DETECTED NOT DETECTED Final   Chlamydophila pneumoniae NOT DETECTED NOT DETECTED Final   Mycoplasma pneumoniae NOT DETECTED NOT DETECTED Final  MRSA PCR Screening     Status: None   Collection Time: 08/24/16  2:13 PM  Result Value Ref Range Status   MRSA by PCR NEGATIVE NEGATIVE Final    Comment:        The GeneXpert MRSA Assay (FDA approved for NASAL specimens only), is one component of a comprehensive MRSA colonization surveillance program. It is not intended to diagnose MRSA infection nor to guide or monitor treatment for MRSA infections.            Radiology Studies: Dg Chest Port 1 View  Result Date: 08/27/2016 CLINICAL DATA:  SOB, hx of IPF, pulmonary hypertension EXAM: PORTABLE CHEST 1 VIEW COMPARISON:  08/23/2016, 08/21/2016 FINDINGS: Shallow lung inflation. Heart size is accentuated by the portable AP position of the patient. Persistent fibrotic changes are identified throughout the lungs. No new consolidations or evidence for pulmonary edema. IMPRESSION: Stable appearance of pulmonary fibrosis. Electronically Signed   By: Nolon Nations M.D.   On: 08/27/2016 14:18        Scheduled Meds: . budesonide (PULMICORT) nebulizer solution  0.25 mg Nebulization BID  . calcium-vitamin D  1 tablet Oral BID  . diltiazem  120 mg Oral Daily  . enoxaparin (LOVENOX) injection  40 mg Subcutaneous Q24H  . feeding supplement (GLUCERNA SHAKE)  237 mL Oral TID BM  . insulin aspart  0-9 Units Subcutaneous TID WC  . levalbuterol  0.63 mg Nebulization TID  . methylPREDNISolone (SOLU-MEDROL) injection  125 mg Intravenous Q6H  . pantoprazole  40 mg Oral Daily  . Pirfenidone  534 mg Oral TID   Continuous Infusions:    LOS: 6 days     Waldron Session, MD, FACP, [facility] Pager [phone]  If 7PM-7AM, please contact night-coverage [email or web address] Password [email or web address] 08/28/2016, 10:14 AM

## 2016-08-28 NOTE — Progress Notes (Signed)
STAFF NOTE,  I personally evaluated patient and elicited key findings of   S: spoke to patient using Designer, television/film set - he says he is same since 2 days   O: however, appears to be on face mask on 7NC  Which Iis more o2 than 2-3 days ago Looks same  In bed wathcing tv crackles  A: IPF progressiveh with chronic hypoxemicr resp failure  P: hs options are tx to [facility] as inpatient which is what family wants for2nd opinoon  V go home without hospice and go to [facility] as outpatient (harder and more challenging logistically for patient) - to get 2nd opinion, V go home wihtout hospice and try get in touch with [facility] when he gets medicaid V go home with hospice  If they go home with hospice - I can be his hospice attending if they agree  Regardless he will have opd fu with [place] Future Appointments Date Time Provider [place]  08/30/2016 2:00 PM Arnoldo Morale, MD [facility] None  09/13/2016 10:45 AM Donita Brooks, NP [facility] None  09/18/2016 11:00 AM Leonie Man, MD [facility]  10/03/2016 10:00 AM Brand Males, MD [facility] None     Dr. Brand Males, M.D., [facility].C.P Pulmonary and Critical Care Medicine Staff Physician, [place] Director - Interstitial Lung Disease  Pulmonary [place] at [facility], [place], [address]  Pager: [phone], If no answer or between  15:00h - 7:00h: call [phone] Telephone: [phone]

## 2016-08-28 NOTE — Plan of Care (Signed)
Problem: Safety: Goal: Ability to remain free from injury will improve Outcome: Completed/Met Date Met: 08/28/16 Patient educated to call RN for assistance. Patient able to demonstrate use of call bell. Patient's family is at bedside.   Problem: Activity: Goal: Risk for activity intolerance will decrease Outcome: Not Progressing Patient is currently intolerant to any type of activity due to his oxygen saturations dropping and his heart rate increasing with any activity. Patient is currently on bedrest. Patient is able to turn himself in the bed.

## 2016-08-29 LAB — GLUCOSE, CAPILLARY
GLUCOSE-CAPILLARY: 286 mg/dL — AB (ref 65–99)
Glucose-Capillary: 235 mg/dL — ABNORMAL HIGH (ref 65–99)
Glucose-Capillary: 266 mg/dL — ABNORMAL HIGH (ref 65–99)
Glucose-Capillary: 241 mg/dL — ABNORMAL HIGH (ref 65–99)

## 2016-08-29 LAB — CREATININE, SERUM: CREATININE: 0.83 mg/dL (ref 0.61–1.24)

## 2016-08-29 MED ORDER — CLOTRIMAZOLE 10 MG MT TROC
10.0000 mg | Freq: Every day | OROMUCOSAL | Status: DC
  Administered 2016-08-29 – 2016-09-02 (×15): 10 mg via ORAL
  Filled 2016-08-29 (×37): qty 1

## 2016-08-29 MED ORDER — LEVALBUTEROL HCL 0.63 MG/3ML IN NEBU: 0.6300 mg | INHALATION_SOLUTION | RESPIRATORY_TRACT | Status: DC | PRN

## 2016-08-29 NOTE — Progress Notes (Signed)
Inpatient Diabetes Program Recommendations  AACE/ADA: New Consensus Statement on Inpatient Glycemic Control (2015)  Target Ranges:  Prepandial:   less than 140 mg/dL      Peak postprandial:   less than 180 mg/dL (1-2 hours)      Critically ill patients:  140 - 180 mg/dL   Lab Results  Component Value Date   GLUCAP 235 (H) 08/29/2016   HGBA1C 6.8 08/16/2016    Review of Glycemic Control  Results for [name], [name] (MRN [id number]) as of 08/29/2016 09:54  Ref. Range 08/28/2016 07:35 08/28/2016 11:38 08/28/2016 16:40 08/28/2016 22:21 08/29/2016 08:06  Glucose-Capillary Latest Ref Range: 65 - 99 mg/dL 307 (H) 256 (H) 206 (H) 244 (H) 235 (H)   Home DM Meds: Metformin 500 mg BID  Current Insulin Orders: Novolog Sensitive Correction Scale/ SSI (0-9 units) TID AC, * steroids 14m q6h  CBGs severely elevated today- steroids continue.   Please consider the following adjustments.  1. Start Lantus 8 units daily (0.15 units/kg dosing based on weight of 53 kg)  2. Start Novolog Meal Coverage: Novolog 3 units TID with meals (hold if pt eats <50% of meal)  JGentry Fitz RN, [place] CDE Diabetes Coordinator Inpatient Diabetes Program  [phone](Team Pager) [phone](ACrown 08/29/2016 9:56 AM

## 2016-08-29 NOTE — Plan of Care (Signed)
Problem: Physical Regulation: Goal: Ability to maintain clinical measurements within normal limits will improve Outcome: Progressing Patient remained on 10 L 45% Ventimask this shift. Desat noted when patient was having bowel movement. Most shift SpO2 92-93% with HR in the 70-80's.   Problem: Activity: Goal: Risk for activity intolerance will decrease Outcome: Progressing Patient using urinal and bedpan due to WOB. Is able to reposition self in bed.   Problem: Fluid Volume: Goal: Ability to maintain a balanced intake and output will improve Outcome: Progressing I&O's monitored.

## 2016-08-29 NOTE — Progress Notes (Signed)
PROGRESS NOTE   [name]  WLN:[id number]    DOB: [birth year]    DOA: 08/05/2016  PCP: Ladell Pier, MD   I have briefly reviewed patients previous medical records in [facility].  Brief Narrative:  71 year old Spanish-speaking male with PMH of pulmonary fibrosis/ILD (Dr. Chase Caller, Pulmonology), previous serum testing positive for [facility] and could be consistent with farmer's lung given patient's history of exposure, being treated with [name], is undergoing lung transplant evaluation at [facility], pulmonary hypertension, DM 2, HTN, hospitalized 7/10-7/17 with acute on chronic hypoxic respiratory failure when he had productive cough, pleurisy and exposure to sick contacts. He had been treated with steroid taper and broad-spectrum antibiotics. At recent discharge, he was on oxygen 4 L/m at rest and 6 L/m with activity. He now presented with 2 days history of worsening dyspnea, cough productive of yellow sputum, chest pain only on coughing, no fevers or chills. Pulmonology consulted.   Assessment & Plan:   Principal Problem:   Acute on chronic respiratory failure (HCC) Active Problems:   HTN (hypertension)   Farmer's lung (HCC)   IPF (idiopathic pulmonary fibrosis) (HCC)   Diabetes mellitus ([place])   Acute respiratory failure ([place])   Palliative care by specialist   1. Acute on chronic hypoxic respiratory failure: No improvement , Secondary to ILD  Progression to very advanced stage ,CT confirmed  , not candidate for Lung transplant,, still with Not much improvement ,steroids 125 mg IV Q6H , Nubs q6hprn ,Abx stopped per pulmonary , prognosis is poor , palliative approach family refused and wanted second input , I called [facility] who accepted the transfer but no beds available, I tried to call them back but they never called me again , the situation is very challenging with his age and social situation , I explained again that he is not candidate for  transplant , I explained to them comfort approach would be the best option for him , they are still in denial stage , no change in the situation . 2. 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 3. Essential hypertension: Controlled. Continue diltiazem. 4. Type II DM: Monitor closely while on steroids. SSI. 5. Leukocytosis: Likely related to ongoing steroid use.  6. Anxiety : start low dose xanax .   DVT prophylaxis: Lovenox Code Status: Full Family Communication: Discussed with son at bedside. Disposition: DC home when medically improved.   Consultants:  Pulmonology   Procedures:  None  Antimicrobials:  Doxycycline    Subjective:   SOB with minimal talk or exertion, severely tachypnic as before  , I asked for simple mask as better delivery for o2 with his mouth breathing pattern ,family at bedside , updated .  Objective:  Vitals:   08/29/16 0443 08/29/16 0726 08/29/16 0900 08/29/16 1115  BP: (!) 142/78  130/71 129/73  Pulse: 96  87 96  Resp: (!) 37  18 (!) 34  Temp: 97.7 F (36.5 C)  97.6 F (36.4 C) (!) 97.5 F (36.4 C)  TempSrc:   Axillary Axillary  SpO2: 100% 94% 95% 92%  Weight: 52.7 kg (116 lb 3.2 oz)     Height:        Examination:  General exam: NAD. Respiratory system: Severe decrease and air and try bilaterally with scattered wheezing and crackles . Cardiovascular system: S1 & S2 heard, RRR. No JVD, murmurs, rubs, gallops  or clicks. No pedal edema. Telemetry: Sinus rhythm.  Gastrointestinal system: Abdomen is nondistended, soft and nontender. No organomegaly or masses felt. Normal bowel sounds heard. Central nervous system: Alert and oriented. No focal neurological deficits. Extremities: Symmetric 5 x 5 power. Skin: No rashes, lesions or ulcers Psychiatry: Judgement and insight appear normal. Mood &  affect appropriate.     Data Reviewed: I have personally reviewed following labs and imaging studies  CBC:  Recent Labs Lab 08/23/16 0934 08/24/16 0404 08/25/16 0225 08/27/16 0149  WBC 15.6* 15.8* 16.1* 16.6*  HGB 14.4 13.6 13.9 13.0  HCT 43.0 40.5 41.2 39.4  MCV 90.5 90.6 90.4 90.2  PLT 228 238 221 628   Basic Metabolic Panel:  Recent Labs Lab 08/24/16 0404 08/25/16 0225 08/27/16 0149 08/29/16 0442  NA 140 137 136  --   K 5.1 4.5 4.1  --   CL 99* 99* 97*  --   CO2 33* 30 32  --   GLUCOSE 180* 207* 223*  --   BUN 18 22* 24*  --   CREATININE 1.01 0.89 0.92 0.83  CALCIUM 9.1 8.3* 8.3*  --    CBG:  Recent Labs Lab 08/28/16 1138 08/28/16 1640 08/28/16 2221 08/29/16 0806 08/29/16 1129  GLUCAP 256* 206* 244* 235* 266*    Recent Results (from the past 240 hour(s))  Respiratory Panel by PCR     Status: None   Collection Time: 08/17/2016 12:50 PM  Result Value Ref Range Status   Adenovirus NOT DETECTED NOT DETECTED Final   Coronavirus 229E NOT DETECTED NOT DETECTED Final   Coronavirus HKU1 NOT DETECTED NOT DETECTED Final   Coronavirus NL63 NOT DETECTED NOT DETECTED Final   Coronavirus OC43 NOT DETECTED NOT DETECTED Final   Metapneumovirus NOT DETECTED NOT DETECTED Final   Rhinovirus / Enterovirus NOT DETECTED NOT DETECTED Final   Influenza A NOT DETECTED NOT DETECTED Final   Influenza B NOT DETECTED NOT DETECTED Final   Parainfluenza Virus 1 NOT DETECTED NOT DETECTED Final   Parainfluenza Virus 2 NOT DETECTED NOT DETECTED Final   Parainfluenza Virus 3 NOT DETECTED NOT DETECTED Final   Parainfluenza Virus 4 NOT DETECTED NOT DETECTED Final   Respiratory Syncytial Virus NOT DETECTED NOT DETECTED Final   Bordetella pertussis NOT DETECTED NOT DETECTED Final   Chlamydophila pneumoniae NOT DETECTED NOT DETECTED Final   Mycoplasma pneumoniae NOT DETECTED NOT DETECTED Final  MRSA PCR Screening     Status: None   Collection Time: 08/24/16  2:13 PM  Result Value Ref  Range Status   MRSA by PCR NEGATIVE NEGATIVE Final    Comment:        The GeneXpert MRSA Assay (FDA approved for NASAL specimens only), is one component of a comprehensive MRSA colonization surveillance program. It is not intended to diagnose MRSA infection nor to guide or monitor treatment for MRSA infections.          Radiology Studies: Dg Chest Port 1 View  Result Date: 08/27/2016 CLINICAL DATA:  SOB, hx of IPF, pulmonary hypertension EXAM: PORTABLE CHEST 1 VIEW COMPARISON:  08/23/2016, 08/21/2016 FINDINGS: Shallow lung inflation. Heart size is accentuated by the portable AP position of the patient. Persistent fibrotic changes are identified throughout the lungs. No new consolidations or evidence for pulmonary edema. IMPRESSION: Stable appearance of pulmonary fibrosis. Electronically Signed   By: Nolon Nations M.D.   On: 08/27/2016 14:18        Scheduled Meds: . budesonide (PULMICORT) nebulizer solution  0.25 mg  Nebulization BID  . calcium-vitamin D  1 tablet Oral BID  . clotrimazole  10 mg Oral 5 X Daily  . diltiazem  120 mg Oral Daily  . enoxaparin (LOVENOX) injection  40 mg Subcutaneous Q24H  . feeding supplement (GLUCERNA SHAKE)  237 mL Oral TID BM  . insulin aspart  0-9 Units Subcutaneous TID WC  . levalbuterol  0.63 mg Nebulization TID  . methylPREDNISolone (SOLU-MEDROL) injection  125 mg Intravenous Q6H  . pantoprazole  40 mg Oral Daily  . Pirfenidone  534 mg Oral TID   Continuous Infusions:    LOS: 7 days     Waldron Session, MD, FACP, [facility]. [facility]   If 7PM-7AM, please contact night-coverage [email or web address] Password [email or web address] 08/29/2016, 1:36 PM

## 2016-08-30 ENCOUNTER — Ambulatory Visit: Payer: Self-pay | Admitting: Family Medicine

## 2016-08-30 LAB — GLUCOSE, CAPILLARY
Glucose-Capillary: 254 mg/dL — ABNORMAL HIGH (ref 65–99)
Glucose-Capillary: 219 mg/dL — ABNORMAL HIGH (ref 65–99)
Glucose-Capillary: 333 mg/dL — ABNORMAL HIGH (ref 65–99)
Glucose-Capillary: 245 mg/dL — ABNORMAL HIGH (ref 65–99)

## 2016-08-30 MED ORDER — LEVALBUTEROL HCL 0.63 MG/3ML IN NEBU
0.6300 mg | INHALATION_SOLUTION | RESPIRATORY_TRACT | Status: DC | PRN
  Administered 2016-08-31: 0.63 mg via RESPIRATORY_TRACT
  Filled 2016-08-30: qty 3

## 2016-08-30 MED ORDER — PREDNISONE 20 MG PO TABS: 60.0000 mg | ORAL_TABLET | Freq: Every day | ORAL | Status: DC

## 2016-08-30 NOTE — Progress Notes (Signed)
Inpatient Diabetes Program Recommendations  AACE/ADA: New Consensus Statement on Inpatient Glycemic Control (2015)  Target Ranges:  Prepandial:   less than 140 mg/dL      Peak postprandial:   less than 180 mg/dL (1-2 hours)      Critically ill patients:  140 - 180 mg/dL   Results for [name], [name] (MRN [id number]) as of 08/30/2016 10:27  Ref. Range 08/29/2016 08:06 08/29/2016 11:29 08/29/2016 17:08 08/29/2016 21:22  Glucose-Capillary Latest Ref Range: 65 - 99 mg/dL 235 (H) 266 (H) 241 (H) 286 (H)   Results for [name], [name] (MRN [id number]) as of 08/30/2016 10:27  Ref. Range 08/30/2016 07:38  Glucose-Capillary Latest Ref Range: 65 - 99 mg/dL 254 (H)    Home DM Meds: Metformin 500 mg BID  Current Insulin Orders: Novolog Sensitive Correction Scale/ SSI (0-9 units) TID AC      MD- Note patient getting Solumedrol 125 mg Q6 hours.  CBGs severely elevated today.  Please consider the following adjustments while patient awaiting bed transfer to [facility] and if within goals of care for this patient:  1. Start Lantus 8 units daily (0.15 units/kg dosing based on weight of 53 kg)  2. Start Novolog Meal Coverage: Novolog 3 units TID with meals (hold if pt eats <50% of meal)     --Will follow patient during hospitalization--  Wyn Quaker RN, MSN, CDE Diabetes Coordinator Inpatient Glycemic Control Team Team Pager: [phone] (8a-5p)

## 2016-08-30 NOTE — Progress Notes (Signed)
Patient and family are wanting to be discharged. Advised pt and family that we're tapering off steroids. Paged Dr. Quincy Simmonds about patient and family's request. Stated that would have to be evaluated tomorrow and could more than likely leave tomorrow, but still need tapering off prednisone. Family understood. They have set up an ambulance type service for 8/9 (Thursday) at 6pm since they want to be discharged tomorrow.

## 2016-08-30 NOTE — Progress Notes (Signed)
Nutrition Follow-up  DOCUMENTATION CODES:   Non-severe (moderate) malnutrition in context of chronic illness  INTERVENTION:   Snacks TID between meals. D/C Glucerna Shake, patient is refusing.  NUTRITION DIAGNOSIS:   Malnutrition (Mild) related to chronic illness (ILD) as evidenced by mild depletion of body fat, mild depletion of muscle mass.  Ongoing  GOAL:   Patient will meet greater than or equal to 90% of their needs  Progressing  MONITOR:   PO intake, Supplement acceptance, Labs, Weight trends, Skin, I & O's  ASSESSMENT:   [name] is a 71 y.o. male with history of interstitial lung disease/Farmer's lung, pulmonary hypertension, diabetes mellitus type 2, hypertension presents to the ER because of worsening shortness of breath. Patient states over the last 2 days patient has been having increasing shortness of breath even at rest with productive cough. Denies any chest pain. Denies any fever or chills. Patient has been recently admitted to the hospital 2 weeks ago. Last week patient had followed up with pulmonary clinic and was on tapering dose of steroids.   Noted poor prognosis, but family in denial stage, requesting transfer to [facility] (being addressed by physician). Patient has been refusing Glucerna Supplements for the past 3 days because it causes increased secretions; will d/c supplement and send snacks instead. Intake has improved, patient is now consuming 75-100% of meals. Labs and medications reviewed. CBG's: [phone]  Diet Order:  Diet heart healthy/carb modified Room service appropriate? Yes; Fluid consistency: Thin  Skin:  Reviewed, no issues  Last BM:  8/7  Height:   Ht Readings from Last 1 Encounters:  08/25/16 5' 4" (1.626 m)    Weight:   Wt Readings from Last 1 Encounters:  08/30/16 117 lb 3.2 oz (53.2 kg)    Ideal Body Weight:  61.4 kg  BMI:  Body mass index is 20.12 kg/m.  Estimated Nutritional Needs:   Kcal:   1700-1900  Protein:  95-110 grams  Fluid:  1.7-1.9 L  EDUCATION NEEDS:   No education needs identified at this time  Molli Barrows, [place], [place], [place] Pager [phone] After Hours Pager [phone]

## 2016-08-30 NOTE — Plan of Care (Signed)
Problem: Physical Regulation: Goal: Ability to maintain clinical measurements within normal limits will improve Outcome: Progressing Patient continues to have episodes of high HR and desat with bowel movements. Patient is able to recover post BM. Patient comfortable through the evening on HF.

## 2016-08-30 NOTE — Progress Notes (Signed)
PROGRESS NOTE [facility]   [name]   YDX:[id number] DOB: 10-Oct-1945  DOA: 08/08/2016 PCP: Daniel Pier, MD   Brief Narrative:  71 year old Spanish-speaking male with past medical history of pulmonary fibrosis/ILD, ? Farmer's lung was treated with [name] and evaluated by [place] [facility] for lung transplant. Patient had a recent hospitalization for acute on chronic respiratory failure treated with abx and steroids. Patient returned due to worsening of the dyspnea and increase in oxygen requirements. Pulmonology was consulted and recommended either transfer to tertiary care center for 2nd opinion vs home with hospice vs no hospice.    Subjective: Patient resting comfortable, now on 6L [place] sating in the upper 80's. Report SOB when goes to the bathroom. No acute events.   Assessment & Plan: Acute on chronic hypoxic respiratory failure - breathing seems back to baseline This seems to be secondary to progression of the very advanced stage of interstitial lung disease. Patient was treated with IV steroids, nebulizer and antibiotics. Antibiotics have been stopped per pulmonary recommendation. Patient has poor prognosis. I discussed case with pulmonologist [facility], and they have declined inpatient transfer there will be happy to see this patient as an outpatient for second opinion. Also contacted [facility] who declined inpatient transfer. If oxygen requirement continued to be at baseline and consider discharge home with hospice versus home with hospice and [facility] evaluation as outpatient.   Idiopathic pulmonary fibrosis Continue prednisone Continue [name]  Essential hypertension BP well-controlled Continue present  Type 2 diabetes mellitus Continue to monitor closely while steroid SSI and monitor CBGs  Anxiety Patient started on low-dose Xanax  DVT prophylaxis: Lovenox Code Status: Full Family Communication: Discussed with son at  bedside Disposition Plan: Home versus home with hospice  Consultants:   Pulmonology   Procedures:   None   Antimicrobials: Anti-infectives    Start     Dose/Rate Route Frequency Ordered Stop   08/24/16 1230  ceFEPIme (MAXIPIME) 1 g in dextrose 5 % 50 mL IVPB  Status:  Discontinued     1 g 100 mL/hr over 30 Minutes Intravenous Every 12 hours 08/24/16 1224 08/24/16 1316   08/23/16 2000  doxycycline (VIBRAMYCIN) 100 mg in dextrose 5 % 250 mL IVPB  Status:  Discontinued     100 mg 125 mL/hr over 120 Minutes Intravenous 2 times per day 08/23/16 1429 08/24/16 1316   07/24/2016 0800  doxycycline (VIBRAMYCIN) 100 mg in dextrose 5 % 250 mL IVPB  Status:  Discontinued     100 mg 125 mL/hr over 120 Minutes Intravenous Every 12 hours 08/10/2016 0610 08/23/16 1429       Objective: Vitals:   08/30/16 0700 08/30/16 0904 08/30/16 1150 08/30/16 1714  BP: 128/77   (!) 126/100  Pulse:   92 (!) 104  Resp:   (!) 23 (!) 35  Temp: 97.6 F (36.4 C)   97.7 F (36.5 C)  TempSrc: Oral   Axillary  SpO2:  92% 96% 90%  Weight:      Height:        Intake/Output Summary (Last 24 hours) at 08/30/16 1747 Last data filed at 08/30/16 1715  Gross per 24 hour  Intake              600 ml  Output             1950 ml  Net            -1350 ml   Filed Weights   08/28/16  0406 08/29/16 0443 08/30/16 0500  Weight: 53.3 kg (117 lb 6.4 oz) 52.7 kg (116 lb 3.2 oz) 53.2 kg (117 lb 3.2 oz)    Examination:  General exam: Mild anxious HEENT:OP moist and clear Respiratory system: Diffuse crackles and rhonchi bilaterally, on nasal cannula 6 L Cardiovascular system: S1 & S2 heard, RRR. No JVD, murmurs, rubs or gallops Gastrointestinal system: Abdomen is nondistended, soft and nontender.  Central nervous system: Alert and oriented. Extremities: No pedal edema.  Skin: No rashes, lesions or ulcers Psychiatry: Mood & affect appropriate.    Data Reviewed: I have personally reviewed following labs and imaging  studies  CBC:  Recent Labs Lab 08/24/16 0404 08/25/16 0225 08/27/16 0149  WBC 15.8* 16.1* 16.6*  HGB 13.6 13.9 13.0  HCT 40.5 41.2 39.4  MCV 90.6 90.4 90.2  PLT 238 221 811   Basic Metabolic Panel:  Recent Labs Lab 08/24/16 0404 08/25/16 0225 08/27/16 0149 08/29/16 0442  NA 140 137 136  --   K 5.1 4.5 4.1  --   CL 99* 99* 97*  --   CO2 33* 30 32  --   GLUCOSE 180* 207* 223*  --   BUN 18 22* 24*  --   CREATININE 1.01 0.89 0.92 0.83  CALCIUM 9.1 8.3* 8.3*  --    GFR: Estimated Creatinine Clearance: 63.2 mL/min (by C-G formula based on SCr of 0.83 mg/dL). Liver Function Tests:  Recent Labs Lab 08/24/16 0404 08/25/16 0225 08/27/16 0149  AST _0 ALT _1 ALKPHOS 42 40 43  BILITOT 0.4 0.4 0.6  PROT 5.9* 5.6* 5.1*  ALBUMIN 2.6* 2.5* 2.4*   Cardiac Enzymes:  Recent Labs Lab 08/25/16 0225 08/25/16 0707  TROPONINI 0.05* <0.03   CBG:  Recent Labs Lab 08/29/16 1708 08/29/16 2122 08/30/16 0738 08/30/16 1221 08/30/16 1634  GLUCAP 241* 286* 254* 219* 333*   Sepsis Labs:  Recent Labs Lab 08/24/16 0404 08/26/16 0546  PROCALCITON <0.10 <0.10    Recent Results (from the past 240 hour(s))  Respiratory Panel by PCR     Status: None   Collection Time: 07/26/2016 12:50 PM  Result Value Ref Range Status   Adenovirus NOT DETECTED NOT DETECTED Final   Coronavirus 229E NOT DETECTED NOT DETECTED Final   Coronavirus HKU1 NOT DETECTED NOT DETECTED Final   Coronavirus NL63 NOT DETECTED NOT DETECTED Final   Coronavirus OC43 NOT DETECTED NOT DETECTED Final   Metapneumovirus NOT DETECTED NOT DETECTED Final   Rhinovirus / Enterovirus NOT DETECTED NOT DETECTED Final   Influenza A NOT DETECTED NOT DETECTED Final   Influenza B NOT DETECTED NOT DETECTED Final   Parainfluenza Virus 1 NOT DETECTED NOT DETECTED Final   Parainfluenza Virus 2 NOT DETECTED NOT DETECTED Final   Parainfluenza Virus 3 NOT DETECTED NOT DETECTED Final   Parainfluenza Virus 4 NOT  DETECTED NOT DETECTED Final   Respiratory Syncytial Virus NOT DETECTED NOT DETECTED Final   Bordetella pertussis NOT DETECTED NOT DETECTED Final   Chlamydophila pneumoniae NOT DETECTED NOT DETECTED Final   Mycoplasma pneumoniae NOT DETECTED NOT DETECTED Final  MRSA PCR Screening     Status: None   Collection Time: 08/24/16  2:13 PM  Result Value Ref Range Status   MRSA by PCR NEGATIVE NEGATIVE Final    Comment:        The GeneXpert MRSA Assay (FDA approved for NASAL specimens only), is one component of a comprehensive MRSA colonization surveillance program. It is not intended  to diagnose MRSA infection nor to guide or monitor treatment for MRSA infections.      Radiology Studies: No results found.   Scheduled Meds: . budesonide (PULMICORT) nebulizer solution  0.25 mg Nebulization BID  . calcium-vitamin D  1 tablet Oral BID  . clotrimazole  10 mg Oral 5 X Daily  . diltiazem  120 mg Oral Daily  . enoxaparin (LOVENOX) injection  40 mg Subcutaneous Q24H  . insulin aspart  0-9 Units Subcutaneous TID WC  . pantoprazole  40 mg Oral Daily  . Pirfenidone  534 mg Oral TID  . [START ON 08/31/2016] predniSONE  60 mg Oral Q breakfast   Continuous Infusions:   LOS: 8 days    Time spent: Total of 25 minutes spent with pt, greater than 50% of which was spent in discussion of  treatment, counseling and coordination of care  Chipper Oman, MD Pager: Text Page via [email or web address]  [phone]  If 7PM-7AM, please contact night-coverage [email or web address] Password [facility] 08/30/2016, 5:47 PM

## 2016-08-31 ENCOUNTER — Inpatient Hospital Stay (HOSPITAL_COMMUNITY): Payer: Medicaid Other

## 2016-08-31 DIAGNOSIS — R0602 Shortness of breath: Secondary | ICD-10-CM

## 2016-08-31 DIAGNOSIS — R509 Fever, unspecified: Secondary | ICD-10-CM

## 2016-08-31 DIAGNOSIS — J849 Interstitial pulmonary disease, unspecified: Secondary | ICD-10-CM

## 2016-08-31 DIAGNOSIS — E119 Type 2 diabetes mellitus without complications: Secondary | ICD-10-CM

## 2016-08-31 LAB — CBC WITH DIFFERENTIAL/PLATELET
BASOS ABS: 0 10*3/uL (ref 0.0–0.1)
Basophils Relative: 0 %
EOS PCT: 0 %
Eosinophils Absolute: 0 10*3/uL (ref 0.0–0.7)
HEMATOCRIT: 43.1 % (ref 39.0–52.0)
Hemoglobin: 14.6 g/dL (ref 13.0–17.0)
LYMPHS ABS: 0.7 10*3/uL (ref 0.7–4.0)
LYMPHS PCT: 4 %
MCH: 30.5 pg (ref 26.0–34.0)
MCHC: 33.9 g/dL (ref 30.0–36.0)
MCV: 90 fL (ref 78.0–100.0)
Monocytes Absolute: 0.3 10*3/uL (ref 0.1–1.0)
Monocytes Relative: 2 %
NEUTROS ABS: 18.2 10*3/uL — AB (ref 1.7–7.7)
Neutrophils Relative %: 94 %
Platelets: 157 10*3/uL (ref 150–400)
RBC: 4.79 MIL/uL (ref 4.22–5.81)
RDW: 15.9 % — ABNORMAL HIGH (ref 11.5–15.5)
WBC: 19.2 10*3/uL — AB (ref 4.0–10.5)

## 2016-08-31 LAB — BLOOD GAS, ARTERIAL
ACID-BASE EXCESS: 11.7 mmol/L — AB (ref 0.0–2.0)
BICARBONATE: 35.9 mmol/L — AB (ref 20.0–28.0)
DELIVERY SYSTEMS: POSITIVE
Drawn by: 441261
EXPIRATORY PAP: 7
FIO2: 60
INSPIRATORY PAP: 18
O2 Saturation: 96.3 %
PH ART: 7.486 — AB (ref 7.350–7.450)
Patient temperature: 98.6
RATE: 12 resp/min
pCO2 arterial: 48.1 mmHg — ABNORMAL HIGH (ref 32.0–48.0)
pO2, Arterial: 84 mmHg (ref 83.0–108.0)

## 2016-08-31 LAB — GLUCOSE, CAPILLARY
GLUCOSE-CAPILLARY: 155 mg/dL — AB (ref 65–99)
GLUCOSE-CAPILLARY: 142 mg/dL — AB (ref 65–99)
Glucose-Capillary: 96 mg/dL (ref 65–99)
Glucose-Capillary: 136 mg/dL — ABNORMAL HIGH (ref 65–99)

## 2016-08-31 LAB — TROPONIN I: Troponin I: <0.03 ng/mL (ref <0.03)

## 2016-08-31 LAB — PROCALCITONIN: Procalcitonin: 0.17 ng/mL

## 2016-08-31 LAB — BASIC METABOLIC PANEL
ANION GAP: 9 (ref 5–15)
BUN: 22 mg/dL — AB (ref 6–20)
CHLORIDE: 96 mmol/L — AB (ref 101–111)
CO2: 34 mmol/L — ABNORMAL HIGH (ref 22–32)
Calcium: 8.9 mg/dL (ref 8.9–10.3)
Creatinine, Ser: 0.73 mg/dL (ref 0.61–1.24)
GFR calc Af Amer: >60 mL/min (ref >60)
GLUCOSE: 121 mg/dL — AB (ref 65–99)
POTASSIUM: 4.2 mmol/L (ref 3.5–5.1)
SODIUM: 139 mmol/L (ref 135–145)

## 2016-08-31 MED ORDER — METOPROLOL TARTRATE 5 MG/5ML IV SOLN
10.0000 mg | Freq: Four times a day (QID) | INTRAVENOUS | Status: DC
Start: 2016-08-31 — End: 2016-09-02
  Administered 2016-08-31 – 2016-09-02 (×8): 10 mg via INTRAVENOUS
  Filled 2016-08-31 (×9): qty 10

## 2016-08-31 MED ORDER — LORAZEPAM 2 MG/ML IJ SOLN
0.5000 mg | Freq: Four times a day (QID) | INTRAMUSCULAR | Status: DC | PRN
  Administered 2016-08-31 – 2016-09-03 (×5): 0.5 mg via INTRAVENOUS
  Filled 2016-08-31 (×4): qty 1

## 2016-08-31 MED ORDER — LORAZEPAM 2 MG/ML IJ SOLN: 0.5000 mg/h | INTRAVENOUS | Status: DC

## 2016-08-31 MED ORDER — LORAZEPAM 2 MG/ML IJ SOLN
0.2500 mg | Freq: Once | INTRAMUSCULAR | Status: AC
  Administered 2016-08-31: 0.25 mg via INTRAVENOUS
  Filled 2016-08-31: qty 1

## 2016-08-31 MED ORDER — MORPHINE BOLUS VIA INFUSION
2.0000 mg | INTRAVENOUS | Status: DC | PRN
  Administered 2016-09-01 – 2016-09-03 (×6): 2 mg via INTRAVENOUS
  Filled 2016-08-31: qty 2

## 2016-08-31 MED ORDER — ARFORMOTEROL TARTRATE 15 MCG/2ML IN NEBU
15.0000 ug | INHALATION_SOLUTION | Freq: Two times a day (BID) | RESPIRATORY_TRACT | Status: DC
  Administered 2016-08-31 – 2016-09-04 (×9): 15 ug via RESPIRATORY_TRACT
  Filled 2016-08-31 (×9): qty 2

## 2016-08-31 MED ORDER — MORPHINE SULFATE (PF) 2 MG/ML IV SOLN
2.0000 mg | INTRAVENOUS | Status: DC | PRN
  Administered 2016-08-31: 2 mg via INTRAVENOUS
  Filled 2016-08-31: qty 1

## 2016-08-31 MED ORDER — MORPHINE SULFATE (PF) 2 MG/ML IV SOLN
INTRAVENOUS | Status: AC
  Filled 2016-08-31: qty 1

## 2016-08-31 MED ORDER — FUROSEMIDE 10 MG/ML IJ SOLN
40.0000 mg | Freq: Once | INTRAMUSCULAR | Status: AC
  Administered 2016-08-31: 40 mg via INTRAVENOUS
  Filled 2016-08-31: qty 4

## 2016-08-31 MED ORDER — LORAZEPAM 2 MG/ML IJ SOLN
INTRAMUSCULAR | Status: AC
  Filled 2016-08-31: qty 1

## 2016-08-31 MED ORDER — ALPRAZOLAM 0.25 MG PO TABS
0.2500 mg | ORAL_TABLET | Freq: Once | ORAL | Status: DC
  Filled 2016-08-31: qty 1

## 2016-08-31 MED ORDER — DEXTROSE 5 % IV SOLN
2.0000 g | Freq: Three times a day (TID) | INTRAVENOUS | Status: DC
  Administered 2016-08-31 – 2016-09-04 (×14): 2 g via INTRAVENOUS
  Filled 2016-08-31 (×16): qty 2

## 2016-08-31 MED ORDER — SODIUM CHLORIDE 0.9 % IV SOLN
1.0000 mg/h | INTRAVENOUS | Status: DC
  Administered 2016-08-31: 1 mg/h via INTRAVENOUS
  Filled 2016-08-31: qty 10

## 2016-08-31 MED ORDER — PANTOPRAZOLE SODIUM 40 MG IV SOLR
40.0000 mg | Freq: Every day | INTRAVENOUS | Status: DC
  Administered 2016-08-31 – 2016-09-02 (×3): 40 mg via INTRAVENOUS
  Filled 2016-08-31 (×3): qty 40

## 2016-08-31 MED ORDER — MORPHINE SULFATE (PF) 2 MG/ML IV SOLN
2.0000 mg | Freq: Once | INTRAVENOUS | Status: AC
  Administered 2016-08-31: 2 mg via INTRAVENOUS

## 2016-08-31 MED ORDER — METHYLPREDNISOLONE SODIUM SUCC 125 MG IJ SOLR
80.0000 mg | Freq: Two times a day (BID) | INTRAMUSCULAR | Status: DC
  Administered 2016-08-31 – 2016-09-02 (×5): 80 mg via INTRAVENOUS
  Filled 2016-08-31 (×6): qty 2

## 2016-08-31 MED ORDER — BUDESONIDE 0.5 MG/2ML IN SUSP
0.5000 mg | Freq: Two times a day (BID) | RESPIRATORY_TRACT | Status: DC
  Administered 2016-08-31 – 2016-09-04 (×9): 0.5 mg via RESPIRATORY_TRACT
  Filled 2016-08-31 (×9): qty 2

## 2016-08-31 NOTE — Consult Note (Signed)
Name: [name] MRN: [id number] DOB: 28-Apr-1945    ADMISSION DATE:  07/31/2016 RE-CONSULTATION DATE:  8/9  REFERRING MD :  Dr. Patrecia Pour  CHIEF COMPLAINT:  SOB  HISTORY OF PRESENT ILLNESS:  HPI obtained from medical chart review. 71 year old Hispanic speaking only male with PMH as below significant for but not limited to a history of pulmonary fibrosis/interstitial lung disease followed by Dr. Chase Caller. 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 He has been treated with [name] at and has been undergoing a lung transplant evaluation at [facility] in which he was recently denied on an out-patient basis due to no insurance and finances (see PCCM note 8/3).   Patient hospitalized on 7/10 - 7/17 with acute on chronic hypoxic respiratory failure and moderate degree of malnutrition. At that presentation patient had a cough productive of yellow phlegm as well as pleurisy without exposure to any sick contacts. He had only recently (6/5) been started on [name].   At baseline patient requires 4 L/m, up to 6L/min with poor exercise tolerance upon discharge.. Steroids weaned to prednisone 40 mg daily. Initially patient treated with vancomycin, Levaquin, and cefepime which was subsequently consolidated to cefepime alone and discontinued prior to discharge. Patient reports that his dyspnea has significant improved at the time of discharge from hospital.   Prior to this admission, prednisone was at  20 mg daily as his most recent dose. Presented with his increased dyspnea on exertion he was experiencing chest tightness but no frank chest pain. Denied subjective fever, chills, or sweats, nausea, vomiting, diarrhea, dysphagia, reflux, sinus congestion, recent sick contacts or travel. He reports his cough did return and was continuing to produce a yellow mucus.  PCCM was initially  consulted 7/31.  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  Additionally patient treated with IV steriods, nebs, and antibiotics.  [facility] contacted by primary team and inpatient transfer was declined but agreed to see as an outpatient; [facility] additionally declined inpatient transfer.  [facility] Medicine was involved to assist in establishing goals of care, but were unable to address code status.  Since, patient has continued to decline becoming tachycardic with increased work of breathing, fever, and increasing oxygen needs requiring BiPAP on 8/9.  PCCM re-consulted.  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  has a past surgical history that includes Hernia repair; Eye surgery; and transthoracic echocardiogram (05/15/2016).   Prior to Admission medications   Medication Sig Start Date End Date Taking? Authorizing Provider  acetaminophen (TYLENOL) 325 MG tablet Take 325 mg by mouth every 6 (six) hours as needed (for pain).   Yes [provider]  albuterol (PROVENTIL HFA;VENTOLIN HFA) 108 (90 Base) MCG/ACT inhaler Inhale 1-2 puffs into the lungs every 4 (four) hours as needed for wheezing or shortness of breath. 07/15/16  Yes Shary Decamp, PA-C  albuterol (PROVENTIL) (2.5 MG/3ML) 0.083% nebulizer solution Take 3 mLs (2.5 mg total) by nebulization every 4 (four) hours as needed for wheezing or shortness of breath. 07/15/16  Yes Shary Decamp, PA-C  calcium-vitamin D (OSCAL WITH D) 500-200 MG-UNIT  tablet Take 1 tablet by mouth 2 (two) times daily. 06/20/16  Yes Ladell Pier, MD  clotrimazole  (MYCELEX) 10 MG troche Take 1 tablet (10 mg total) by mouth 5 (five) times daily. 08/14/16  Yes Parrett, Tammy S, NP  diltiazem (CARDIZEM CD) 120 MG 24 hr capsule Take 1 capsule (120 mg total) by mouth daily. 07/13/16 10/11/16 Yes Leonie Man, MD  Glycopyrrolate-Formoterol (BEVESPI AEROSPHERE) 9-4.8 MCG/ACT AERO Inhale 2 puffs into the lungs 2 (two) times daily. 07/21/16  Yes Brand Males, MD  metFORMIN (GLUCOPHAGE) 500 MG tablet Take 1 tablet (500 mg total) by mouth 2 (two) times daily with a meal. 08/16/16  Yes Amao, Enobong, MD  omeprazole (PRILOSEC) 40 MG capsule Take 1 capsule (40 mg total) by mouth daily. 06/20/16 06/20/17 Yes Ladell Pier, MD  Pirfenidone ([name]) 267 MG CAPS Take 534 mg by mouth 3 (three) times daily.   Yes [provider]  predniSONE (DELTASONE) 20 MG tablet Take 2 tablets (40 mg) by mouth daily starting 7/18. Then take 1 and 1/2 tablets (33m) daily starting 7/25. Then take 1 tablet daily starting 8/1. Patient taking differently: Take 30 mg by mouth daily with breakfast.  08/08/16  Yes HCorey Harold NP   Allergies  Allergen Reactions  . Cyclobenzaprine Swelling and Other (See Comments)    Tongue and lips swell    FAMILY HISTORY:  family history includes Heart attack in his mother. SOCIAL HISTORY:  reports that he quit smoking about 15 years ago. He has a 80.00 pack-year smoking history. He has never used smokeless tobacco. He reports that he does not drink alcohol or use drugs.  REVIEW OF SYSTEMS:   Constitutional: Negative for fever, chills, weight loss, malaise/fatigue and diaphoresis.  HENT: Negative for hearing loss, ear pain, nosebleeds, congestion, sore throat, neck pain, tinnitus and ear discharge.   Eyes: Negative for blurred vision, double vision, photophobia, pain, discharge and redness.  Respiratory: Negative for cough, hemoptysis, sputum production, shortness of breath, wheezing and stridor, DOE.   Cardiovascular: Negative for chest  pain, palpitations, orthopnea, claudication, leg swelling and PND.  Gastrointestinal: Negative for heartburn, nausea, vomiting, abdominal pain, diarrhea, constipation, blood in stool and melena.  Genitourinary: Negative for dysuria, urgency, frequency, hematuria and flank pain.  Musculoskeletal: Negative for myalgias, back pain, joint pain and falls.  Skin: Negative for itching and rash.  Neurological: Negative for dizziness, tingling, tremors, sensory change, speech change, focal weakness, seizures, loss of consciousness, weakness and headaches.  Endo/Heme/Allergies: Negative for environmental allergies and polydipsia. Does not bruise/bleed easily.  SUBJECTIVE:  Currently on BiPAP 18/7, 60% FiO2 Primary team at bedside having [place] in which patient and his son at bedside wish for everything to be done.   VITAL SIGNS: Temp:  [97.7 F (36.5 C)-101.1 F (38.4 C)] 101.1 F (38.4 C) (08/09 0729) Pulse Rate:  [92-140] 140 (08/09 0729) Resp:  [23-36] 36 (08/09 0729) BP: (120-144)/(81-100) 120/85 (08/09 0729) SpO2:  [90 %-97 %] 97 % (08/09 0729) FiO2 (%):  [90 %] 90 % (08/09 0558)  PHYSICAL EXAMINATION: General:  Cachectic Hispanic male lying in bed on BiPAP in moderate distress HEENT: MM pink/dry, no JVD PSY: Anxious Neuro: Awake, appropriate, MAE CV: ST 140s, +2 pulses PULM: even/labored on BiPAP with accessory muscle use, bibasilar crackles L>R GI: flat, soft, non-tender, bs active  Extremities: warm/dry, no edema  Skin: no rashes   Recent Labs Lab 08/25/16 0225 08/27/16 0149 08/29/16 0442  NA 137 136  --  K 4.5 4.1  --   CL 99* 97*  --   CO2 30 32  --   BUN 22* 24*  --   CREATININE 0.89 0.92 0.83  GLUCOSE 207* 223*  --     Recent Labs Lab 08/25/16 0225 08/27/16 0149  HGB 13.9 13.0  HCT 41.2 39.4  WBC 16.1* 16.6*  PLT 221 230   No results found.  SIGNIFICANT EVENTS  7/31- Admitted  8/9- PCCM re-consulted   STUDIES:  ECHO 05/15/16 > EF 60-65, G1DD,  PA 56  CXR 7/30 > Stable marked prominence of the interstitial markings with diffuse honeycombing, cardiac silhouette remains borderline enlarged. Bi-apical pleural thickening is unchanged. Lower thoracic and upper lumbar spine degenerative changes. L1 vertebral compression deformity.  HRCT 8/1>>> 1. Progressive pulmonary fibrosis, as detailed above, considered a definite usual interstitial pneumonia (UIP) pattern. 2. There is also moderate centrilobular and paraseptal emphysema. 3. Aortic atherosclerosis.  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 - on daily prednisone [place] opacity 8/9- with fever and slight increase in leukocytosis Tachycardia  -ESR 21, CRP 2.1 on 7/31 - denied inpatient transfer to [facility] - denied as outpatient transplant referral at [facility]  - ABG noted, respiratory distress improved with BiPAP and ativan   Plan  - start Zosyn - send sputum for cx - trend CXR as needed - trend PCT to narrow abx as able -  EKG and troponin  - change to IV steroids, will need 3 week taper  - continue PPI for SUP - Continue BiPAP as needed for increased WOB, transition to HFNC when able - Wean O2 for sats > 90 - Would advise against intubation at this point as this would not be beneficial to him and likely not to wean off vent with end-stage ILD/IPF, continue supportive care - Ativan as needed for anxiety  - continue xopenex, atrovent, and pulmicort (dose adjusted) - continue home esbriet  - Continue patient and family support, appreciate PMT assistance [address] [facility] Follow Up 09/15/2016 at 11:45 with Dr. Chase Caller.( Scheduled)  Family updates:  Patient's son at bedside and updated.   Kennieth Rad, ACNP Pulmonary and [place] Pager: [phone]  08/31/2016, 8:35 AM

## 2016-08-31 NOTE — Progress Notes (Signed)
PROGRESS NOTE [facility]   Elliot Meldrum   ZCH:[id number] DOB: 05-Jun-1945  DOA: 07/26/2016 PCP: Ladell Pier, MD   Brief Narrative:  71 year old Spanish-speaking male with past medical history of pulmonary fibrosis/ILD, ? Farmer's lung was treated with [name] and evaluated by [place] [facility] for lung transplant. Patient had a recent hospitalization for acute on chronic respiratory failure treated with abx and steroids. Patient returned due to worsening of the dyspnea and increase in oxygen requirements. Pulmonology was consulted and recommended either transfer to tertiary care center for 2nd opinion vs home with hospice vs no hospice. Attempted to transfer patient for 2nd opinion but [facility] and [facility] declined as is not appropriate for inpatient transfer. Night of 08/30/16 patient had respiratory distress and was placed on BiPAP.   Subjective: Patient on BiPAP, with labor breathing, sat > 91%. Patient had a febrile episode overnight. Continues to be tachycardic and tachypneic. Patient is awake and oriented  Assessment & Plan: Acute on chronic hypoxic respiratory failure - worsened overnight - requiring BiPAP  This seems to be secondary to progression of the very advanced stage of interstitial lung disease. Patient was treated with IV steroids, nebulizer and antibiotics. Antibiotics were stopped per pulmonary recommendation. Patient has poor prognosis. [facility] and [facility] declined transfer.  CXR ordered and reviewed by me show increase in density in LLB possible pneumonia, with increase in WBC and febrile will place on abx Zosyn. Will get ABG, and EKG. Continue BiPAP as trial failed trial of high flow. Pulmonary service consulted.   I had a long discussion with son and patient regarding code status, I have explained in spanish with details what this mean and the risk involved on the process. Also discussed his prognosis in details. Patient wishes is to  continue to be full code for now.   Idiopathic pulmonary fibrosis Per pulmonary   Essential hypertension BP well-controlled   Type 2 diabetes mellitus Continue to monitor closely while steroid Metformin on hold, if CBG's remains elevated may need Lantus while inpatient SSI and monitor CBGs  Anxiety Very anxious, was started on Xanax BID  Ativan PRN added   DVT prophylaxis: Lovenox Code Status: Full Family Communication: Discussed with son at bedside Disposition Plan: To be determined   Consultants:   Pulmonology   Procedures:   None   Antimicrobials: Anti-infectives    Start     Dose/Rate Route Frequency Ordered Stop   08/24/16 1230  ceFEPIme (MAXIPIME) 1 g in dextrose 5 % 50 mL IVPB  Status:  Discontinued     1 g 100 mL/hr over 30 Minutes Intravenous Every 12 hours 08/24/16 1224 08/24/16 1316   08/23/16 2000  doxycycline (VIBRAMYCIN) 100 mg in dextrose 5 % 250 mL IVPB  Status:  Discontinued     100 mg 125 mL/hr over 120 Minutes Intravenous 2 times per day 08/23/16 1429 08/24/16 1316   08/12/2016 0800  doxycycline (VIBRAMYCIN) 100 mg in dextrose 5 % 250 mL IVPB  Status:  Discontinued     100 mg 125 mL/hr over 120 Minutes Intravenous Every 12 hours 08/14/2016 0610 08/23/16 1429      Objective: Vitals:   08/31/16 0558 08/31/16 0600 08/31/16 0644 08/31/16 0729  BP:   130/87 120/85  Pulse: (!) 125   (!) 140  Resp: (!) 32 (!) 30 (!) 34 (!) 36  Temp:    (!) 101.1 F (38.4 C)  TempSrc:    Axillary  SpO2: 97%  96% 97%  Weight:  Height:        Intake/Output Summary (Last 24 hours) at 08/31/16 0910 Last data filed at 08/31/16 0829  Gross per 24 hour  Intake              850 ml  Output             1625 ml  Net             -775 ml   Filed Weights   08/28/16 0406 08/29/16 0443 08/30/16 0500  Weight: 53.3 kg (117 lb 6.4 oz) 52.7 kg (116 lb 3.2 oz) 53.2 kg (117 lb 3.2 oz)    Examination:  General: Mild distress, on BiPAP  Cardiovascular: tachycardia, S1/S2  +, no rubs, no gallops Respiratory: Air entry diminished b/l, diffuse rhonchi b/l and mild crackles at the LLB . Abdominal: Soft, NT, ND, bowel sounds + Neuro: AAOx3, no focal findings  Extremities: no edema, no cyanosis  Data Reviewed: I have personally reviewed following labs and imaging studies  CBC:  Recent Labs Lab 08/25/16 0225 08/27/16 0149 08/31/16 0827  WBC 16.1* 16.6* 19.2*  NEUTROABS  --   --  18.2*  HGB 13.9 13.0 14.6  HCT 41.2 39.4 43.1  MCV 90.4 90.2 90.0  PLT 221 230 106   Basic Metabolic Panel:  Recent Labs Lab 08/25/16 0225 08/27/16 0149 08/29/16 0442  NA 137 136  --   K 4.5 4.1  --   CL 99* 97*  --   CO2 30 32  --   GLUCOSE 207* 223*  --   BUN 22* 24*  --   CREATININE 0.89 0.92 0.83  CALCIUM 8.3* 8.3*  --    GFR: Estimated Creatinine Clearance: 63.2 mL/min (by C-G formula based on SCr of 0.83 mg/dL). Liver Function Tests:  Recent Labs Lab 08/25/16 0225 08/27/16 0149  AST 22 20  ALT 24 28  ALKPHOS 40 43  BILITOT 0.4 0.6  PROT 5.6* 5.1*  ALBUMIN 2.5* 2.4*   Cardiac Enzymes:  Recent Labs Lab 08/25/16 0225 08/25/16 0707  TROPONINI 0.05* <0.03   CBG:  Recent Labs Lab 08/30/16 0738 08/30/16 1221 08/30/16 1634 08/30/16 2216 08/31/16 0732  GLUCAP 254* 219* 333* 245* 155*   Sepsis Labs:  Recent Labs Lab 08/26/16 0546  PROCALCITON <0.10    Recent Results (from the past 240 hour(s))  Respiratory Panel by PCR     Status: None   Collection Time: 08/03/2016 12:50 PM  Result Value Ref Range Status   Adenovirus NOT DETECTED NOT DETECTED Final   Coronavirus 229E NOT DETECTED NOT DETECTED Final   Coronavirus HKU1 NOT DETECTED NOT DETECTED Final   Coronavirus NL63 NOT DETECTED NOT DETECTED Final   Coronavirus OC43 NOT DETECTED NOT DETECTED Final   Metapneumovirus NOT DETECTED NOT DETECTED Final   Rhinovirus / Enterovirus NOT DETECTED NOT DETECTED Final   Influenza A NOT DETECTED NOT DETECTED Final   Influenza B NOT DETECTED NOT  DETECTED Final   Parainfluenza Virus 1 NOT DETECTED NOT DETECTED Final   Parainfluenza Virus 2 NOT DETECTED NOT DETECTED Final   Parainfluenza Virus 3 NOT DETECTED NOT DETECTED Final   Parainfluenza Virus 4 NOT DETECTED NOT DETECTED Final   Respiratory Syncytial Virus NOT DETECTED NOT DETECTED Final   Bordetella pertussis NOT DETECTED NOT DETECTED Final   Chlamydophila pneumoniae NOT DETECTED NOT DETECTED Final   Mycoplasma pneumoniae NOT DETECTED NOT DETECTED Final  MRSA PCR Screening     Status: None   Collection Time: 08/24/16  2:13 PM  Result Value Ref Range Status   MRSA by PCR NEGATIVE NEGATIVE Final    Comment:        The GeneXpert MRSA Assay (FDA approved for NASAL specimens only), is one component of a comprehensive MRSA colonization surveillance program. It is not intended to diagnose MRSA infection nor to guide or monitor treatment for MRSA infections.      Radiology Studies: Dg Chest Port 1 View  Result Date: 08/31/2016 CLINICAL DATA:  Worsening shortness of breath. EXAM: PORTABLE CHEST 1 VIEW COMPARISON:  08/27/2016, 05/03/2016 and CT 05/13/2016 FINDINGS: Exam demonstrates low lung volumes with diffuse bilateral chronic interstitial disease compatible with known pulmonary fibrosis. Possible mixed interstitial airspace density in the left base. No definite effusion. Cardiomediastinal silhouette and remainder of the exam is unchanged. IMPRESSION: Possible mixed interstitial airspace density in the left base as cannot exclude atelectasis or infection. Stable chronic interstitial changes compatible with known pulmonary fibrosis. Electronically Signed   By: Marin Olp M.D.   On: 08/31/2016 08:53    Scheduled Meds: . LORazepam      . budesonide (PULMICORT) nebulizer solution  0.25 mg Nebulization BID  . calcium-vitamin D  1 tablet Oral BID  . clotrimazole  10 mg Oral 5 X Daily  . diltiazem  120 mg Oral Daily  . enoxaparin (LOVENOX) injection  40 mg Subcutaneous Q24H  .  insulin aspart  0-9 Units Subcutaneous TID WC  . pantoprazole  40 mg Oral Daily  . Pirfenidone  534 mg Oral TID  . predniSONE  60 mg Oral Q breakfast   Continuous Infusions:   LOS: 9 days    Time spent: Total of 35 minutes spent with pt, greater than 50% of which was spent in discussion of  treatment, counseling and coordination of care  Chipper Oman, MD Pager: Text Page via [email or web address]  [phone]  If 7PM-7AM, please contact night-coverage [email or web address] Password [email or web address] 08/31/2016, 9:10 AM

## 2016-08-31 NOTE — Progress Notes (Addendum)
[facility] P[facility]  I came to the bedside emergently to evaluate Mr. [name]. He has advanced fibrosis, has been turned down for transplant by [facility] and has been hospitalized for dyspena/hypoxemia now.    I was called to the bedside for severe dyspnea and hypoxemia today, requiring BIPAP.  He has a new infiltrate in the left lung.    On exam Vitals:   08/31/16 0729 08/31/16 0900 08/31/16 1128 08/31/16 1138  BP: 120/85  126/82   Pulse: (!) 140 (!) 145 65 (!) 159  Resp: (!) 36 (!) 50 (!) 50 (!) 63  Temp: (!) 101.1 F (38.4 C)  (!) 100.7 F (38.2 C)   TempSrc: Axillary  Axillary   SpO2: 97% 95% 91% 92%  Weight:      Height:       FiO2 (%):  [60 %-90 %] 70 %  General:  Severe shortness of breath HENT: NCAT OP clear PULM: Crackles bilaterally, normal effort CV: Tachy, regular, no mgr GI: BS+, soft, nontender MSK: normal bulk and tone Neuro: anxious, moves all four ext  CXR from today reviewed: severe interstitial lung disease/fibrotic changes bilaterally, there is a new infiltrate in the left lung  CT chest from this admission earlier this year images independently reviewed showing advanced fibrotic changes with honeycombing and traction bronchiectasis throughout both lungs bilaterally. Exline  Impression: End-stage idiopathic pulmonary fibrosis Acute on chronic respiratory failure hypoxemia Healthcare associated pneumonia   Discussion: This is a sad situation. Unfortunately this gentleman has no options for management of his severe chronic pulmonary fibrosis. He has been turned down for lung transplant and is currently suffering the consequences of an acute illness on top of very severe chronic lung disease. He will not survive this illness. I explained to his son and daughters today at length while showing them images from his chest x-ray that mechanical ventilation would cause harm to his lungs through positive pressure and that CPR in the event of cardiac arrest would cause broken ribs  and severe pain. I explained that none of these would provide any medical benefit to the patient. They voiced understanding and understand that he will die today. They're calling and family to see him today. We will start morphine to treat air hunger and last weight as needed. He will likely need a morphine drip today. I will also give Lasix in case there is some degree of pulmonary edema.  Explained to nurses and to primary service today.  Would ask for palliative involvement.  My cc time 45 minutes  Roselie Awkward, MD [place] [facility] Pager: [phone] Cell: [phone] After 3pm or if no response, call [phone]

## 2016-08-31 NOTE — Progress Notes (Signed)
Around 5-5:15am patient became tachycardic/tachypneic and hypoxic in the 80's after having an episode of coughing on 10 L HFNC. Patient able to recover SpO2 saturation but still having increased work of breath and tachycardia. Respiratory called to assess/PRN breathing treatment. Before respiratory arrived patient used urinal in bed which led to repeat episode of tachycardia and desaturation. [facility] paged, order received to give PRN Xanax early to ease in patient's recovery. Respiratory into room, PRN treatment given. Respiratory decided to try heated HFNC but ultimately patient needed to be placed onto Bipap. Lung sounds BL diminished with coarse crackles to LLL.[facility] repaged regarding change of status and continued elevated HR. [facility] to room, order received to give 0.36m IVP Ativan. Level of care transitioned to step-down. HR still maintaining in the 120's but appears more comfortable on the Bipap. Primary team to address plan of care this morning.   *Previously patient would have episodes of elevated HR and WOB with bowel movements/coughing but would be able to recover with HR returning to the 80-90's, PRN Xanax assisted in recovery.

## 2016-08-31 NOTE — Care Management Note (Signed)
Case Management Note  Patient Details  Name: Raekwon Winkowski MRN: [id number] Date of Birth: May 15, 1945  Subjective/Objective:    Advanced pulmonary fibrosis                Action/Plan: please see previous NCM notes Discharge Planning: 08/30/2016 Chart reviewed. Plan is dc to [facility] once bed available. Attending has contacted hospital to follow up bed availability.  08/31/2016  Spoke to attending and pt is currently on Bipap. Will continue to follow.     Expected Discharge Date:                 Expected Discharge Plan:  Acute to Acute Transfer  In-House Referral:  Clinical Social Work  Discharge planning Services  CM Consult  Post Acute Care Choice:  NA Choice offered to:  NA  DME Arranged:  N/A DME Agency:  NA  HH Arranged:  NA HH Agency:  NA  Status of Service:  In process, will continue to follow  If discussed at Long Length of Stay Meetings, dates discussed:  08/29/2016, 08/31/2016  Additional Comments:  Erenest Rasher, RN 08/31/2016, 10:24 AM

## 2016-08-31 NOTE — Progress Notes (Signed)
Notified by RN at approximately 0530 that patient was tachycardiac and with increased WOB after using urinal. Previously became tachycardiac and tachypneic after performing ADL frequently but usually recovered. Patient sustained elevated HR (130-140's) and dropped O2 sats into 80's with increased WOB and accessory muscle use. Patient remained awake/alert but anxious. Given xanax 0.71m po with no change to status. Placed on humidified HFNC and given xoponex neb tx without relief. Placed on BIPAP by RRT. Per RN assessment, BBS diminished with course crackles in LLL. Currently noted resting in no acute distress. WOB greatly improved though remains tachypneic (RR low 30's), remains tachycardiac in 120's. Brief discussion with son at bedside who reveals pt wishes not to be intubated. Pt remains full code in record.  Assessment/Plan:  (1) Acute hypoxic respiratory failure - In setting of likely end-stage pulmonary fibrosis. Recent hospitalization for PNA. Continue BIPAP for now. Will defer further changes in plan to rounding MD this AM.   XLovey Newcomer NP  [facility]

## 2016-08-31 NOTE — Progress Notes (Signed)
Daily Progress Note   Patient Name: [name]       Date: 08/31/2016 DOB: Mar 17, 1945  Age: 71 y.o. MRN#: [id number] Attending Physician: Patrecia Pour, Christean Grief, MD Primary Care Physician: Ladell Pier, MD Admit Date: 07/28/2016  Reason for Consultation/Follow-up: Establishing goals of care, Non pain symptom management and Psychosocial/spiritual support  Subjective: [name] is now on BiPAP with an acute decline in clinical status this morning. He will wake and track me, but is not able to verbally interact due to his severe respiratory symptoms. He has been talking with family, per their report and expressing strong anxiety and fearfulness.  Length of Stay: 9  Current Medications: Scheduled Meds:  . arformoterol  15 mcg Nebulization BID  . budesonide (PULMICORT) nebulizer solution  0.5 mg Nebulization BID  . calcium-vitamin D  1 tablet Oral BID  . clotrimazole  10 mg Oral 5 X Daily  . enoxaparin (LOVENOX) injection  40 mg Subcutaneous Q24H  . insulin aspart  0-9 Units Subcutaneous TID WC  . methylPREDNISolone (SOLU-MEDROL) injection  80 mg Intravenous Q12H  . metoprolol tartrate  10 mg Intravenous Q6H  . pantoprazole  40 mg Oral Daily  . Pirfenidone  534 mg Oral TID    Continuous Infusions: . ceFEPime (MAXIPIME) IV Stopped (08/31/16 1202)  . morphine      PRN Meds: acetaminophen **OR** acetaminophen, ALPRAZolam, ipratropium, levalbuterol, LORazepam, morphine **AND** morphine, ondansetron **OR** ondansetron (ZOFRAN) IV  Physical Exam  Constitutional:   Frail and cachectic appearing man on BiPAP. Minimally interactive with me, more interactive with family  HENT:  Head: Normocephalic and atraumatic.  Mouth/Throat: Abnormal dentition.  Dry mouth Eyes: EOM are normal.  Neck: Normal range of motion. Neck  supple.  Cardiovascular: Regular rhythm.  Tachycardia present.   Pulmonary/Chest:  Significant tachypnea with increased WOB. On BiPAP, rate 34 and labored during my assessment. Poor air movement in lungs. Abdominal: Soft. Bowel sounds are normal.  Musculoskeletal:  Minimal movement due to respiratory status Neurological:  Will wake to voice but only verbally interacting with family Skin: Skin is warm and dry. There is pallor.  Pt gray Psychiatric: UTA  Vital Signs: BP 126/82 (BP Location: Right Arm)   Pulse (!) 159   Temp (!) 100.7 F (38.2 C) (Axillary)   Resp (!) 63   Ht _0  (1.626 m)   Wt 53.2 kg (117 lb 3.2 oz)   SpO2 92%   BMI 20.12 kg/m  SpO2: SpO2: 92 % O2 Device: O2 Device: Bi-PAP O2 Flow Rate: O2 Flow Rate (L/min): 10 L/min  Intake/output summary:  Intake/Output Summary (Last 24 hours) at 08/31/16 1355 Last data filed at 08/31/16 0900  Gross per 24 hour  Intake              730 ml  Output             1625 ml  Net             -895 ml   LBM: Last BM Date: 08/30/16 Baseline Weight: Weight: 55.1 kg (121 lb 6.4 oz) Most recent weight: Weight:  (Bed scale showing zero, unable to standing weight d/t WOB)  Palliative Assessment/Data: PPS 10%  Patient Active Problem List   Diagnosis Date Noted  . Palliative care by specialist   . Acute respiratory failure ([place]) 07/25/2016  . Diabetes mellitus ([place]) 08/16/2016  . Malnutrition of moderate degree 08/05/2016  . Pulmonary fibrosis ([place]) 08/01/2016  . Acute on chronic respiratory failure ([place]) 08/01/2016  . Chronic respiratory failure with hypoxia/ noct desats 07/14/2016  . 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 IPF (idiopathic pulmonary fibrosis) ([place]) 06/27/2016  . 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 Acute respiratory distress 06/07/2016  . HCAP (healthcare-associated pneumonia) 06/07/2016  . Sepsis ([place]) 06/07/2016  . HTN (hypertension) 06/07/2016  . Anemia   .  Hyperglycemia   . Chest pain on breathing   . Pneumomediastinum ([place])   . Protein-calorie malnutrition, severe 05/15/2016  . Pneumonitis   . Interstitial lung disease ([place]) 05/13/2016  . ILD (interstitial lung disease) ([place]) 05/12/2016  . Weight loss, abnormal 05/12/2016  . Chest pain 05/12/2016  . Sinus tachycardia 05/12/2016  . Dyspnea and respiratory abnormalities 05/12/2016    Palliative Care Assessment & Plan   HPI: 71 y.o. 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 He is followed by Dr. Chase Caller. Pt had been referred to [facility] for transplant consideration, however evaluation is on hold until he has obtained adequate insurance (per 08/10/16 note in [place]). He was here 7/10-7/17 for acute on chronic respiratory failure r/t CAP. He did complete a full course of abx and increased steroids. He now presents again with acute on chronic respiratory failure d/t IPF flare. He was admitted on 07/31/2016. High resolution CT confirmed disease progression, with Pulmonology feeling he likely a rapid progressor. Pulm recommended Hospice as they do not feel he is a transplant candidate and he is declining. Palliative consulted to assist in clarifying GOC.   Assessment: I followed-up today at the request of the Primary and Pulmonology teams. The patient has been hoping for a transfer to [facility] or [facility] for a second opinion, however both hospitals declined him as he is not appropriate for inpatient transfer. [facility] had been willing to see him as an outpatient. Unfortunately, he had an acute decline early this morning with onset of severe dyspnea and hypoxemia. A new infiltrate was noted in the left lung on imaging. Pulmonology weighed-in and felt his decline was the "consequences of an acute illness on top of very severe chronic lung disease." They feel he will not survive this  illness, which was expressed to the family and he was subsequently made DNR.  I revisited after the family had a chance to speak with the Pulmonology team. A Spanish translator was present. There were many family members in our meeting. They had a good understanding of the information Pulm had provided, as well as the real potential [name] may not survive the day. We talked through multiple of their concerns, including worries about his nutritional status and fear that he was suffering. I reinforced the plan to continue supportive care with antibiotics and medication, as well as ensuring comfort and preventing suffering with the use of morphine. All agreed with this plan. Finally, I did explain that the morphine dose needed to ensure his comfort may make him very sleepy and less able to interact with them, which they accepted.   Recommendations/Plan:  DNR, continue supportive care w/ medications and antibiotics, however ensure comfort and prevent suffering during this time. Family understands  he may die, but continue to hope for improvement. NOT comfort measures only, rather supportive care with concurrent suffering prevention  Start morphine drip with basal rate and PRN boluses. Discussed use with care nurse  Code Status:  DNR  Prognosis:   Hours - Days  Discharge Planning:  To Be Determined  Care plan was discussed with family, care nurse, primary team.  Thank you for allowing the Palliative Medicine Team to assist in the care of this patient.  Time in: 1330 Time out: 1400 Total time: 65 minutes    Greater than 50%  of this time was spent counseling and coordinating care related to the above assessment and plan.  Charlynn Court, NP Palliative Medicine Team [phone] pager (7a-5p) Team Phone # [phone]

## 2016-08-31 NOTE — Plan of Care (Signed)
Problem: Fluid Volume: Goal: Ability to maintain a balanced intake and output will improve Outcome: Not Progressing Pt is currently on bipap and is unable to receive fluids due to risk of aspiration.

## 2016-08-31 NOTE — Progress Notes (Signed)
Pharmacy Antibiotic Note  [name] is a 71 y.o. male admitted on 08/03/2016 with pneumonia.  Pharmacy has been consulted for Cefepime dosing.  I d/w Dr Quincy Simmonds and he does not wish to add MRSA coverage as his screening was negative.  Cr < 1, CrCl 50-55 ml/min Respiratory status worsening and currently on BiPAP Density noted on CXR, Tm 101.1  Plan: Cefepime 2g IV q8 Watch renal fxn and adjust dosing as needed  Height: _0  (162.6 cm) Weight:  (Bed scale showing zero, unable to standing weight d/t WOB) IBW/kg (Calculated) : 59.2  Temp (24hrs), Avg:98.8 F (37.1 C), Min:97.7 F (36.5 C), Max:101.1 F (38.4 C)   Recent Labs Lab 08/25/16 0225 08/27/16 0149 08/29/16 0442 08/31/16 0827  WBC 16.1* 16.6*  --  19.2*  CREATININE 0.89 0.92 0.83 0.73    Estimated Creatinine Clearance: 65.6 mL/min (by C-G formula based on SCr of 0.73 mg/dL).    Allergies  Allergen Reactions  . Cyclobenzaprine Swelling and Other (See Comments)    Tongue and lips swell    Antimicrobials this admission: 8/9 Cefepime >>  Dose adjustments this admission:   Microbiology results:  8/9 Sputum: sent  8/2 MRSA PCR: neg 7/31 Resp viral panel neg  Thank you for allowing pharmacy to be a part of this patient's care.  Lewie Chamber., PharmD [facility] [phone] until 4p, then [phone]

## 2016-09-01 DIAGNOSIS — F419 Anxiety disorder, unspecified: Secondary | ICD-10-CM

## 2016-09-01 LAB — BASIC METABOLIC PANEL
ANION GAP: 10 (ref 5–15)
BUN: 45 mg/dL — ABNORMAL HIGH (ref 6–20)
CALCIUM: 8.5 mg/dL — AB (ref 8.9–10.3)
CHLORIDE: 100 mmol/L — AB (ref 101–111)
CO2: 32 mmol/L (ref 22–32)
Creatinine, Ser: 0.91 mg/dL (ref 0.61–1.24)
GFR calc non Af Amer: >60 mL/min (ref >60)
Glucose, Bld: 172 mg/dL — ABNORMAL HIGH (ref 65–99)
Potassium: 5.3 mmol/L — ABNORMAL HIGH (ref 3.5–5.1)
SODIUM: 142 mmol/L (ref 135–145)

## 2016-09-01 LAB — GLUCOSE, CAPILLARY
GLUCOSE-CAPILLARY: 183 mg/dL — AB (ref 65–99)
GLUCOSE-CAPILLARY: 256 mg/dL — AB (ref 65–99)
Glucose-Capillary: 221 mg/dL — ABNORMAL HIGH (ref 65–99)

## 2016-09-01 LAB — CBC
HEMATOCRIT: 42.1 % (ref 39.0–52.0)
HEMOGLOBIN: 13.8 g/dL (ref 13.0–17.0)
MCH: 30.2 pg (ref 26.0–34.0)
MCHC: 32.8 g/dL (ref 30.0–36.0)
MCV: 92.1 fL (ref 78.0–100.0)
Platelets: 144 10*3/uL — ABNORMAL LOW (ref 150–400)
RBC: 4.57 MIL/uL (ref 4.22–5.81)
RDW: 16.5 % — ABNORMAL HIGH (ref 11.5–15.5)
WBC: 21.7 10*3/uL — AB (ref 4.0–10.5)

## 2016-09-01 MED ORDER — WHITE PETROLATUM GEL
Status: AC
  Filled 2016-09-01: qty 1

## 2016-09-01 NOTE — Progress Notes (Signed)
Daily Progress Note   Patient Name: [name]       Date: 09/01/2016 DOB: [birth year]  Age: 71 y.o. MRN#: [id number] Attending Physician: Patrecia Pour, Christean Grief, MD Primary Care Physician: Ladell Pier, MD Admit Date: 08/05/2016  Reason for Consultation/Follow-up: Establishing goals of care, Non pain symptom management and Psychosocial/spiritual support  Subjective: [name] is doing better this morning. He was transitioned to HFNC. Morphine drip continues with good control of his symptoms. He continues to feel quite weak and tired, but has no acute complaints. Extensive family remains at his bedside. They are cautiously optimistic but concerned about the long term plan even if he improves from this acute decline.   Length of Stay: 10  Current Medications: Scheduled Meds:  . arformoterol  15 mcg Nebulization BID  . budesonide (PULMICORT) nebulizer solution  0.5 mg Nebulization BID  . calcium-vitamin D  1 tablet Oral BID  . clotrimazole  10 mg Oral 5 X Daily  . enoxaparin (LOVENOX) injection  40 mg Subcutaneous Q24H  . insulin aspart  0-9 Units Subcutaneous TID WC  . methylPREDNISolone (SOLU-MEDROL) injection  80 mg Intravenous Q12H  . metoprolol tartrate  10 mg Intravenous Q6H  . pantoprazole (PROTONIX) IV  40 mg Intravenous QHS  . Pirfenidone  534 mg Oral TID  . white petrolatum        Continuous Infusions: . ceFEPime (MAXIPIME) IV Stopped (09/01/16 3276)  . morphine 1 mg/hr (08/31/16 1441)    PRN Meds: acetaminophen **OR** acetaminophen, ipratropium, levalbuterol, LORazepam, morphine **AND** morphine, ondansetron **OR** ondansetron (ZOFRAN) IV  Physical Exam  Constitutional:   Frail and cachectic appearing man now on HFNC.  HENT:  Head: Normocephalic and atraumatic.  Mouth/Throat: Abnormal  dentition.  Dry mouth Eyes: EOM are normal.  Neck: Normal range of motion. Neck supple.  Cardiovascular: Regular rhythm. Regular rate.  Pulmonary/Chest:  Tachypnea, but able to have short conversation.  Less labored compared to yesterday. Abdominal: Soft. Bowel sounds are normal.  Musculoskeletal:  Minimal movement due to respiratory status and fatigue Neurological:  Alert and fully oriented. Skin: Skin is warm and dry. There is pallor.  Pt gray Psychiatric: Flat affect. Speech delayed and pt withdrawn. Normal cognition and memory.   Vital Signs: BP (!) 144/81 (BP Location: Right Arm)   Pulse 88   Temp 97.8 F (36.6 C) (Axillary)   Resp (!) 23   Ht _0  (1.626 m)   Wt 53.2 kg (117 lb 3.2 oz)   SpO2 94%   BMI 20.12 kg/m  SpO2: SpO2: 94 % O2 Device: O2 Device: High Flow Nasal Cannula O2 Flow Rate: O2 Flow Rate (L/min): 12 L/min  Intake/output summary:   Intake/Output Summary (Last 24 hours) at 09/01/16 1516 Last data filed at 09/01/16 1500  Gross per 24 hour  Intake           294.32 ml  Output              300 ml  Net            -5.68 ml   LBM: Last BM Date: 08/30/16 Baseline Weight: Weight: 55.1 kg (121 lb 6.4 oz) Most recent weight: Weight:  (Bed  scale showing zero, unable to standing weight d/t WOB)  Palliative Assessment/Data: PPS 30%     Patient Active Problem List   Diagnosis Date Noted  . Palliative care by specialist   . Acute respiratory failure ([place]) 08/02/2016  . Diabetes mellitus ([place]) 08/16/2016  . Malnutrition of moderate degree 08/05/2016  . Pulmonary fibrosis ([place]) 08/01/2016  . Acute on chronic respiratory failure ([place]) 08/01/2016  . Chronic respiratory failure with hypoxia/ noct desats 07/14/2016  . 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 IPF (idiopathic pulmonary fibrosis) ([place]) 06/27/2016  . 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 Acute respiratory distress 06/07/2016  . HCAP (healthcare-associated  pneumonia) 06/07/2016  . Sepsis ([place]) 06/07/2016  . HTN (hypertension) 06/07/2016  . Anemia   . Hyperglycemia   . Chest pain on breathing   . Pneumomediastinum ([place])   . Protein-calorie malnutrition, severe 05/15/2016  . Pneumonitis   . Interstitial lung disease ([place]) 05/13/2016  . ILD (interstitial lung disease) ([place]) 05/12/2016  . Weight loss, abnormal 05/12/2016  . Chest pain 05/12/2016  . Sinus tachycardia 05/12/2016  . SOB (shortness of breath) 05/12/2016    Palliative Care Assessment & Plan   HPI: 71 y.o. 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 He is followed by Dr. Chase Caller. Pt had been referred to [facility] for transplant consideration, however evaluation is on hold until he has obtained adequate insurance (per 08/10/16 note in [place]). He was here 7/10-7/17 for acute on chronic respiratory failure r/t CAP. He did complete a full course of abx and increased steroids. He now presents again with acute on chronic respiratory failure d/t IPF flare. He was admitted on 08/12/2016. High resolution CT confirmed disease progression, with Pulmonology feeling he likely a rapid progressor. Pulm recommended Hospice as they do not feel he is a transplant candidate and he is declining. Palliative consulted to assist in clarifying GOC.   The patient has been hoping for a transfer to [facility] or [facility] for a second opinion, however both hospitals declined him as he is not appropriate for inpatient transfer. [facility] had been willing to see him as an outpatient. Unfortunately, he had an acute decline on 8/9 with onset of severe dyspnea and hypoxemia. A new infiltrate was noted in the left lung on imaging. Pulmonology weighed-in and felt his decline was the "consequences of an acute illness on top of very severe chronic lung disease." They feel he will not survive this illness, which  was expressed to the family and he was subsequently made DNR.  Assessment: The patient is doing much better today. He was transitioned to a HFNC prior to my assessment, and has done well on it so far. The morphine drip continues and he appears much more comfortable with marked improvement in his breathing--it is much less labored and he feels more comfortable. With a translator present, I shared that this drip could be increased or decreased based on his symptoms. He felt it was at a good level now and wanted to leave it at the current rate. He also understands the plan is to continue supportive care with medications and antibiotics, and see how things change. Family at the bedside did not have any questions.    Recommendations/Plan:  DNR, continue supportive care w/ medications and antibiotics, however ensure comfort and prevent suffering during this time. NOT comfort measures only, rather supportive care with concurrent suffering prevention  Continue morphine drip with basal rate and PRN boluses  Palliative to continue to follow and monitor symptoms and support [place] discussions  Code Status:  DNR  Prognosis:   Hours - Days  Discharge Planning:  To Be Determined  Care plan was discussed with pt, family, and care nurse.   Thank you for allowing the Palliative Medicine Team to assist in the care of this patient.  Total time: 15 minutes    Greater than 50%  of this time was spent counseling and coordinating care related to the above assessment and plan.  Charlynn Court, NP Palliative Medicine Team [phone] pager (7a-5p) Team Phone # [phone]

## 2016-09-01 NOTE — Progress Notes (Signed)
Chaplain visited the patient and family.  Family (granddaughter) is expressing to me that "they are letting him die because he is an immigrant and has no insurance".  Chaplain provided space for them to share their feelings without responding to their comments.  Granddaughter proceeded to ask Chaplain about fundraising options.  Chaplain shared that she doesn't know of any of those options.    Chaplain will remain available to support this family and provided empathic care.    09/01/16 1358  Clinical Encounter Type  Visited With Patient;Patient and family together  Visit Type Initial;Psychological support;Spiritual support;Social support;Critical Care  Referral From Nurse  Consult/Referral To Chaplain  Stress Factors  Family Stress Factors Health changes;Major life changes

## 2016-09-01 NOTE — Plan of Care (Signed)
Problem: Physical Regulation: Goal: Ability to maintain clinical measurements within normal limits will improve Outcome: Progressing Pt remained on BiPAP throughout night. Mask removed w/ intermittent oral care.  Pt felt more comfortable. A/O X4. VSS throughout shift. Pt denies any pain or sob. Morphine gtt continued.  Problem: Fluid Volume: Goal: Ability to maintain a balanced intake and output will improve Outcome: Not Progressing Pt remained on BiPAP mask. Did not want to take off for extended periods of time. No oral intake over night. Oral care provided.

## 2016-09-01 NOTE — Progress Notes (Signed)
PROGRESS NOTE [facility]   [name]   FIE:[id number] DOB: [birth year]  DOA: 08/21/2016 PCP: Ladell Pier, MD   Brief Narrative:  71 year old Spanish-speaking male with past medical history of pulmonary fibrosis/ILD, ? Farmer's lung was treated with [name] and evaluated by [facility] for lung transplant. Patient had a recent hospitalization for acute on chronic respiratory failure treated with abx and steroids. Patient returned due to worsening of the dyspnea and increase in oxygen requirements. Pulmonology was consulted and recommended either transfer to tertiary care center for 2nd opinion vs home with hospice vs no hospice. Attempted to transfer patient for 2nd opinion but [facility] and [facility] declined as is not appropriate for inpatient transfer. Night of 08/30/16 patient had respiratory distress and was placed on BiPAP. Pulmonary team patient and make patient DO NOT RESUSCITATE. Palliative care was consulted and we are treating symptoms without escalating treatments.  Subjective: Patient seen and examined, he was in BiPAP this morning. Alert oriented and cooperative. Report feeling much better. Denies chest pain, palpitations and dizziness. Complaining of dry mouth.  Assessment & Plan: Acute on chronic hypoxic respiratory failure - required BiPAP for over 24 hours - some improvement This seems to be secondary to progression of the very advanced stage of interstitial lung disease. Patient was treated with IV steroids, nebulizer and antibiotics. Antibiotics were stopped per pulmonary recommendation. Patient has poor prognosis. [facility] and [facility] declined transfer. On 8/9 patient went into severe respiratory distress and was placed in BiPAP, CXR was done showed increase in density in LLB possible pneumonia, with increase in WBC and febrile, patient was placed on cefepime which will be continued along with bronchodilators and steroids.. Patient seems  comfortable at this point will give a trial of high flow oxygen, to get break off BiPAP. Palliative care involved, patient now is DNR and will be treating treatable causes and keep patient comfortable. We will see how patient able clinically in the next 1-2 days to have the clear disposition.  Idiopathic pulmonary fibrosis Per pulmonary   Essential hypertension BP well-controlled Continue current treatment   Type 2 diabetes mellitus Continue to monitor closely while steroid Metformin on hold, if CBG's remains elevated may need Lantus while inpatient SSI and monitor CBGs  Anxiety On Xanax and Ativan PRN  D/c Xanax  DVT prophylaxis: Lovenox Code Status: DNR  Family Communication: Discussed with son at bedside Disposition Plan: TBD   Consultants:   Pulmonology   Procedures:   None   Antimicrobials: Anti-infectives    Start     Dose/Rate Route Frequency Ordered Stop   08/31/16 1045  ceFEPIme (MAXIPIME) 2 g in dextrose 5 % 50 mL IVPB     2 g 100 mL/hr over 30 Minutes Intravenous Every 8 hours 08/31/16 1013     08/24/16 1230  ceFEPIme (MAXIPIME) 1 g in dextrose 5 % 50 mL IVPB  Status:  Discontinued     1 g 100 mL/hr over 30 Minutes Intravenous Every 12 hours 08/24/16 1224 08/24/16 1316   08/23/16 2000  doxycycline (VIBRAMYCIN) 100 mg in dextrose 5 % 250 mL IVPB  Status:  Discontinued     100 mg 125 mL/hr over 120 Minutes Intravenous 2 times per day 08/23/16 1429 08/24/16 1316   08/11/2016 0800  doxycycline (VIBRAMYCIN) 100 mg in dextrose 5 % 250 mL IVPB  Status:  Discontinued     100 mg 125 mL/hr over 120 Minutes Intravenous Every 12 hours 08/15/2016 0610 08/23/16 1429  Objective: Vitals:   09/01/16 0432 09/01/16 0451 09/01/16 0801 09/01/16 1049  BP:  103/80 134/81 (!) 141/94  Pulse: 88 87 86   Resp: 18 (!) 9 20   Temp:  (!) 97.5 F (36.4 C) 97.8 F (36.6 C)   TempSrc: Axillary Axillary Axillary   SpO2: 99% 94% 93%   Weight:      Height:        Intake/Output  Summary (Last 24 hours) at 09/01/16 1225 Last data filed at 09/01/16 0346  Gross per 24 hour  Intake           113.09 ml  Output              500 ml  Net          -386.91 ml   Filed Weights   08/28/16 0406 08/29/16 0443 08/30/16 0500  Weight: 53.3 kg (117 lb 6.4 oz) 52.7 kg (116 lb 3.2 oz) 53.2 kg (117 lb 3.2 oz)    Examination:  General: Calm and comfortable, on BiPAP, alert and cooperative  Cardiovascular: Tachycardia, S1S2 no murmurs  Respiratory: Air entry degrees, diffuse bilateral rhonchi/crackles Abdominal: Soft nontender nondistended Neuro: Alert and oriented Extremities: No lower extremity edema  Data Reviewed: I have personally reviewed following labs and imaging studies  CBC:  Recent Labs Lab 08/27/16 0149 08/31/16 0827 09/01/16 0232  WBC 16.6* 19.2* 21.7*  NEUTROABS  --  18.2*  --   HGB 13.0 14.6 13.8  HCT 39.4 43.1 42.1  MCV 90.2 90.0 92.1  PLT 230 157 945*   Basic Metabolic Panel:  Recent Labs Lab 08/27/16 0149 08/29/16 0442 08/31/16 0827 09/01/16 0232  NA 136  --  139 142  K 4.1  --  4.2 5.3*  CL 97*  --  96* 100*  CO2 32  --  34* 32  GLUCOSE 223*  --  121* 172*  BUN 24*  --  22* 45*  CREATININE 0.92 0.83 0.73 0.91  CALCIUM 8.3*  --  8.9 8.5*   GFR: Estimated Creatinine Clearance: 57.6 mL/min (by C-G formula based on SCr of 0.91 mg/dL). Liver Function Tests:  Recent Labs Lab 08/27/16 0149  AST 20  ALT 28  ALKPHOS 43  BILITOT 0.6  PROT 5.1*  ALBUMIN 2.4*   Cardiac Enzymes:  Recent Labs Lab 08/31/16 0959  TROPONINI <0.03   CBG:  Recent Labs Lab 08/31/16 1126 08/31/16 1642 08/31/16 2122 09/01/16 0725 09/01/16 1139  GLUCAP 96 142* 136* 221* 183*   Sepsis Labs:  Recent Labs Lab 08/26/16 0546 08/31/16 0959  PROCALCITON <0.10 0.17    Recent Results (from the past 240 hour(s))  Respiratory Panel by PCR     Status: None   Collection Time: 08/01/2016 12:50 PM  Result Value Ref Range Status   Adenovirus NOT DETECTED  NOT DETECTED Final   Coronavirus 229E NOT DETECTED NOT DETECTED Final   Coronavirus HKU1 NOT DETECTED NOT DETECTED Final   Coronavirus NL63 NOT DETECTED NOT DETECTED Final   Coronavirus OC43 NOT DETECTED NOT DETECTED Final   Metapneumovirus NOT DETECTED NOT DETECTED Final   Rhinovirus / Enterovirus NOT DETECTED NOT DETECTED Final   Influenza A NOT DETECTED NOT DETECTED Final   Influenza B NOT DETECTED NOT DETECTED Final   Parainfluenza Virus 1 NOT DETECTED NOT DETECTED Final   Parainfluenza Virus 2 NOT DETECTED NOT DETECTED Final   Parainfluenza Virus 3 NOT DETECTED NOT DETECTED Final   Parainfluenza Virus 4 NOT DETECTED NOT DETECTED Final  Respiratory Syncytial Virus NOT DETECTED NOT DETECTED Final   Bordetella pertussis NOT DETECTED NOT DETECTED Final   Chlamydophila pneumoniae NOT DETECTED NOT DETECTED Final   Mycoplasma pneumoniae NOT DETECTED NOT DETECTED Final  MRSA PCR Screening     Status: None   Collection Time: 08/24/16  2:13 PM  Result Value Ref Range Status   MRSA by PCR NEGATIVE NEGATIVE Final    Comment:        The GeneXpert MRSA Assay (FDA approved for NASAL specimens only), is one component of a comprehensive MRSA colonization surveillance program. It is not intended to diagnose MRSA infection nor to guide or monitor treatment for MRSA infections.      Radiology Studies: Dg Chest Port 1 View  Result Date: 08/31/2016 CLINICAL DATA:  Worsening shortness of breath. EXAM: PORTABLE CHEST 1 VIEW COMPARISON:  08/27/2016, 05/03/2016 and CT 05/13/2016 FINDINGS: Exam demonstrates low lung volumes with diffuse bilateral chronic interstitial disease compatible with known pulmonary fibrosis. Possible mixed interstitial airspace density in the left base. No definite effusion. Cardiomediastinal silhouette and remainder of the exam is unchanged. IMPRESSION: Possible mixed interstitial airspace density in the left base as cannot exclude atelectasis or infection. Stable chronic  interstitial changes compatible with known pulmonary fibrosis. Electronically Signed   By: Marin Olp M.D.   On: 08/31/2016 08:53    Scheduled Meds: . arformoterol  15 mcg Nebulization BID  . budesonide (PULMICORT) nebulizer solution  0.5 mg Nebulization BID  . calcium-vitamin D  1 tablet Oral BID  . clotrimazole  10 mg Oral 5 X Daily  . enoxaparin (LOVENOX) injection  40 mg Subcutaneous Q24H  . insulin aspart  0-9 Units Subcutaneous TID WC  . methylPREDNISolone (SOLU-MEDROL) injection  80 mg Intravenous Q12H  . metoprolol tartrate  10 mg Intravenous Q6H  . pantoprazole (PROTONIX) IV  40 mg Intravenous QHS  . Pirfenidone  534 mg Oral TID  . white petrolatum       Continuous Infusions: . ceFEPime (MAXIPIME) IV Stopped (09/01/16 2633)  . morphine 1 mg/hr (08/31/16 1441)     LOS: 10 days   Time spent: Total of 35 minutes spent with pt, greater than 50% of which was spent in discussion of  treatment, counseling and coordination of care  Chipper Oman, MD Pager: Text Page via [email or web address]  [phone]  If 7PM-7AM, please contact night-coverage [email or web address] Password [email or web address] 09/01/2016, 12:25 PM

## 2016-09-01 NOTE — Progress Notes (Signed)
Morphine gtt running for symptom relief. RR currently 9-12 while sleeping. Pt is arousable and comfortable. Other VSS. Notified on call provider of RR trend. Per provider okay to continue morphine gtt as long as pt remains arousable.

## 2016-09-01 NOTE — Progress Notes (Signed)
Palliative Medicine RN Note:  AM check by PMT. Family at bedside. Pt denies pain. Lips are very dry and uncomfortable. Provided vaseline. Pt was very anxious about removing bipap mask long enough to apply vaseline, so I applied it very quickly and replaced the mask.   Pt communicated that the mask was not applied well, so RT was called to set it up. Elmyra Ricks will attempt to move him to HFNC. Encouraged family to call for RN if pt becomes uncomfortable; he has rx for prn morphine for this type of discomfort/dyspnea.   Updated RN Melynn. Plan f/u by PMT member this afternoon.  Marjie Skiff Danyale Ridinger, RN, BSN, [facility] 09/01/2016 10:58 AM Cell [phone] 8:00-4:00 Monday-Friday Office [phone]

## 2016-09-01 NOTE — Progress Notes (Signed)
Patient taken off BiPAP and placed on 12L HFNC. Seems to be doing well. Family at bedside and helped me explain procedure. Will wean as tolerated, but seems comfortable on higher settings.

## 2016-09-02 LAB — GLUCOSE, CAPILLARY
GLUCOSE-CAPILLARY: 187 mg/dL — AB (ref 65–99)
Glucose-Capillary: 245 mg/dL — ABNORMAL HIGH (ref 65–99)
Glucose-Capillary: 283 mg/dL — ABNORMAL HIGH (ref 65–99)

## 2016-09-02 LAB — PROCALCITONIN: Procalcitonin: 2.22 ng/mL

## 2016-09-02 MED ORDER — METHYLPREDNISOLONE SODIUM SUCC 125 MG IJ SOLR
60.0000 mg | Freq: Two times a day (BID) | INTRAMUSCULAR | Status: DC
  Administered 2016-09-02 – 2016-09-04 (×5): 60 mg via INTRAVENOUS
  Filled 2016-09-02 (×6): qty 2

## 2016-09-02 MED ORDER — DILTIAZEM HCL ER COATED BEADS 180 MG PO CP24
180.0000 mg | ORAL_CAPSULE | Freq: Every day | ORAL | Status: DC
  Administered 2016-09-02: 180 mg via ORAL
  Filled 2016-09-02: qty 1

## 2016-09-02 NOTE — Progress Notes (Signed)
PROGRESS NOTE [facility]   [name]   JOA:[id number] DOB: 06-Apr-1945  DOA: 08/16/2016 PCP: Ladell Pier, MD   Brief Narrative:  71 year old Spanish-speaking male with past medical history of pulmonary fibrosis/ILD, ? Farmer's lung was treated with [name] and evaluated by [place] [facility] for lung transplant. Patient had a recent hospitalization for acute on chronic respiratory failure treated with abx and steroids. Patient returned due to worsening of the dyspnea and increase in oxygen requirements. Pulmonology was consulted and recommended either transfer to tertiary care center for 2nd opinion vs home with hospice vs no hospice. Attempted to transfer patient for 2nd opinion but [facility] and [facility] declined as is not appropriate for inpatient transfer. Night of 08/30/16 patient had respiratory distress and was placed on BiPAP. Pulmonary team patient and make patient DO NOT RESUSCITATE. Palliative care was consulted and we are treating symptoms without escalating treatments.  Subjective: Patient seen and examined, on high flow nasal cannula. Tolerating well. No acute events overnight. Patient remains afebrile. He reports "feeling much better".   Assessment & Plan: Acute on chronic hypoxic respiratory failure - required BiPAP for over 24 hours - some improvement This seems to be secondary to progression of the very advanced stage of interstitial lung disease. Patient was treated with IV steroids, nebulizer and antibiotics. Antibiotics were stopped per pulmonary recommendation. Patient has poor prognosis. [facility] and [facility] declined transfer. On 8/9 patient went into severe respiratory distress and was placed in BiPAP, CXR was done showed increase in density in LLB possible pneumonia, with increase in WBC and febrile, patient was placed on cefepime which will be continued along with bronchodilators and steroids. Patient remains comfortable at this point  although he continues to be on morphine drip, now with need to start mobilizing the patient and taper down morphine to use as needed. Will discuss with palliative care, will be the next step for disposition. For now continue oxygen supplementation to maintain saturations above 88%, mobilize the patient as tolerated and continue antibiotics.  Idiopathic pulmonary fibrosis Pulmonary team has no further recommendations in this matter, patient was not candidate for transplant at [facility].   Essential hypertension Blood pressure remains stable Continue current treatment   Type 2 diabetes mellitus Continue to monitor closely while steroid CBGs acceptable SSI and monitor CBGs  Anxiety On Xanax and Ativan PRN  D/c Xanax  DVT prophylaxis: Lovenox Code Status: DNR  Family Communication: Discussed with son at bedside Disposition Plan: TBD   Consultants:   Pulmonology   Procedures:   None   Antimicrobials: Anti-infectives    Start     Dose/Rate Route Frequency Ordered Stop   08/31/16 1045  ceFEPIme (MAXIPIME) 2 g in dextrose 5 % 50 mL IVPB     2 g 100 mL/hr over 30 Minutes Intravenous Every 8 hours 08/31/16 1013     08/24/16 1230  ceFEPIme (MAXIPIME) 1 g in dextrose 5 % 50 mL IVPB  Status:  Discontinued     1 g 100 mL/hr over 30 Minutes Intravenous Every 12 hours 08/24/16 1224 08/24/16 1316   08/23/16 2000  doxycycline (VIBRAMYCIN) 100 mg in dextrose 5 % 250 mL IVPB  Status:  Discontinued     100 mg 125 mL/hr over 120 Minutes Intravenous 2 times per day 08/23/16 1429 08/24/16 1316   08/08/2016 0800  doxycycline (VIBRAMYCIN) 100 mg in dextrose 5 % 250 mL IVPB  Status:  Discontinued     100 mg 125 mL/hr over 120  Minutes Intravenous Every 12 hours 08/12/2016 0610 08/23/16 1429      Objective: Vitals:   09/01/16 1923 09/01/16 2027 09/02/16 0052 09/02/16 0433  BP:  (!) 141/90 132/89 (!) 162/96  Pulse:   (!) 102 (!) 118  Resp:  19 (!) 24 (!) 21  Temp:  97.7 F (36.5 C) 97.8 F  (36.6 C) 97.7 F (36.5 C)  TempSrc:  Axillary Axillary   SpO2: 95% 93% 91% 90%  Weight:      Height:        Intake/Output Summary (Last 24 hours) at 09/02/16 0946 Last data filed at 09/02/16 0300  Gross per 24 hour  Intake           181.23 ml  Output              500 ml  Net          -318.77 ml   Filed Weights   08/29/16 0443 08/30/16 0500  Weight: 52.7 kg (116 lb 3.2 oz) 53.2 kg (117 lb 3.2 oz)    Examination:  General: NAD, on nasal cannula Cardiovascular: RRR, S1/S2 +, no rubs, no gallops Respiratory: Diminished air entry bilaterally, diffuse rales and rhonchi although mild improvement Abdominal: Soft, NT, ND, bowel sounds + Extremities: no edema,   Data Reviewed: I have personally reviewed following labs and imaging studies  CBC:  Recent Labs Lab 08/27/16 0149 08/31/16 0827 09/01/16 0232  WBC 16.6* 19.2* 21.7*  NEUTROABS  --  18.2*  --   HGB 13.0 14.6 13.8  HCT 39.4 43.1 42.1  MCV 90.2 90.0 92.1  PLT 230 157 786*   Basic Metabolic Panel:  Recent Labs Lab 08/27/16 0149 08/29/16 0442 08/31/16 0827 09/01/16 0232  NA 136  --  139 142  K 4.1  --  4.2 5.3*  CL 97*  --  96* 100*  CO2 32  --  34* 32  GLUCOSE 223*  --  121* 172*  BUN 24*  --  22* 45*  CREATININE 0.92 0.83 0.73 0.91  CALCIUM 8.3*  --  8.9 8.5*   GFR: Estimated Creatinine Clearance: 57.6 mL/min (by C-G formula based on SCr of 0.91 mg/dL). Liver Function Tests:  Recent Labs Lab 08/27/16 0149  AST 20  ALT 28  ALKPHOS 43  BILITOT 0.6  PROT 5.1*  ALBUMIN 2.4*   Cardiac Enzymes:  Recent Labs Lab 08/31/16 0959  TROPONINI <0.03   CBG:  Recent Labs Lab 08/31/16 2122 09/01/16 0725 09/01/16 1139 09/01/16 1615 09/02/16 0829  GLUCAP 136* 221* 183* 256* 245*   Sepsis Labs:  Recent Labs Lab 08/31/16 0959 09/02/16 0527  PROCALCITON 0.17 2.22    Recent Results (from the past 240 hour(s))  MRSA PCR Screening     Status: None   Collection Time: 08/24/16  2:13 PM  Result  Value Ref Range Status   MRSA by PCR NEGATIVE NEGATIVE Final    Comment:        The GeneXpert MRSA Assay (FDA approved for NASAL specimens only), is one component of a comprehensive MRSA colonization surveillance program. It is not intended to diagnose MRSA infection nor to guide or monitor treatment for MRSA infections.      Radiology Studies: No results found.  Scheduled Meds: . arformoterol  15 mcg Nebulization BID  . budesonide (PULMICORT) nebulizer solution  0.5 mg Nebulization BID  . calcium-vitamin D  1 tablet Oral BID  . clotrimazole  10 mg Oral 5 X Daily  . enoxaparin (LOVENOX)  injection  40 mg Subcutaneous Q24H  . insulin aspart  0-9 Units Subcutaneous TID WC  . methylPREDNISolone (SOLU-MEDROL) injection  80 mg Intravenous Q12H  . metoprolol tartrate  10 mg Intravenous Q6H  . pantoprazole (PROTONIX) IV  40 mg Intravenous QHS  . Pirfenidone  534 mg Oral TID   Continuous Infusions: . ceFEPime (MAXIPIME) IV 2 g (09/02/16 0600)  . morphine 1 mg/hr (08/31/16 1441)     LOS: 11 days   Time spent: Total of 15 minutes spent with pt, greater than 50% of which was spent in discussion of  treatment, counseling and coordination of care  Chipper Oman, MD Pager: Text Page via [email or web address]  [phone]  If 7PM-7AM, please contact night-coverage [email or web address] Password [facility] 09/02/2016, 9:46 AM

## 2016-09-02 NOTE — Plan of Care (Signed)
Problem: Physical Regulation: Goal: Ability to maintain clinical measurements within normal limits will improve Outcome: Progressing Pt remained on HF  throughout night. Tolerated well. SpO2 > 90. Occasional desaturations w/ minimal exertion. HR SR/ST. Medications administered. Pt denies any pain. Reports that he is comfortable. Educated pt on repositioning in bed. Family at bedside. Will continue to monitor.

## 2016-09-03 DIAGNOSIS — J189 Pneumonia, unspecified organism: Secondary | ICD-10-CM

## 2016-09-03 DIAGNOSIS — I10 Essential (primary) hypertension: Secondary | ICD-10-CM

## 2016-09-03 LAB — GLUCOSE, CAPILLARY
Glucose-Capillary: 260 mg/dL — ABNORMAL HIGH (ref 65–99)
Glucose-Capillary: 174 mg/dL — ABNORMAL HIGH (ref 65–99)
Glucose-Capillary: 155 mg/dL — ABNORMAL HIGH (ref 65–99)
Glucose-Capillary: 217 mg/dL — ABNORMAL HIGH (ref 65–99)

## 2016-09-03 LAB — BASIC METABOLIC PANEL
Anion gap: 8 (ref 5–15)
BUN: 30 mg/dL — ABNORMAL HIGH (ref 6–20)
CALCIUM: 8.6 mg/dL — AB (ref 8.9–10.3)
CHLORIDE: 101 mmol/L (ref 101–111)
CO2: 35 mmol/L — AB (ref 22–32)
Creatinine, Ser: 0.7 mg/dL (ref 0.61–1.24)
GFR calc Af Amer: >60 mL/min (ref >60)
GFR calc non Af Amer: >60 mL/min (ref >60)
GLUCOSE: 231 mg/dL — AB (ref 65–99)
POTASSIUM: 4.5 mmol/L (ref 3.5–5.1)
Sodium: 144 mmol/L (ref 135–145)

## 2016-09-03 LAB — CBC WITH DIFFERENTIAL/PLATELET
Basophils Absolute: 0 10*3/uL (ref 0.0–0.1)
Basophils Relative: 0 %
Eosinophils Absolute: 0 10*3/uL (ref 0.0–0.7)
Eosinophils Relative: 0 %
HEMATOCRIT: 41.9 % (ref 39.0–52.0)
HEMOGLOBIN: 13.6 g/dL (ref 13.0–17.0)
LYMPHS ABS: 0.4 10*3/uL — AB (ref 0.7–4.0)
LYMPHS PCT: 2 %
MCH: 30.2 pg (ref 26.0–34.0)
MCHC: 32.5 g/dL (ref 30.0–36.0)
MCV: 93.1 fL (ref 78.0–100.0)
MONO ABS: 0.3 10*3/uL (ref 0.1–1.0)
MONOS PCT: 1 %
NEUTROS ABS: 21.7 10*3/uL — AB (ref 1.7–7.7)
NEUTROS PCT: 97 %
Platelets: 144 10*3/uL — ABNORMAL LOW (ref 150–400)
RBC: 4.5 MIL/uL (ref 4.22–5.81)
RDW: 16.1 % — AB (ref 11.5–15.5)
WBC: 22.4 10*3/uL — ABNORMAL HIGH (ref 4.0–10.5)

## 2016-09-03 MED ORDER — PANTOPRAZOLE SODIUM 40 MG PO TBEC: 40.0000 mg | DELAYED_RELEASE_TABLET | Freq: Every day | ORAL | Status: DC

## 2016-09-03 MED ORDER — METOPROLOL TARTRATE 5 MG/5ML IV SOLN
2.5000 mg | INTRAVENOUS | Status: DC | PRN
  Administered 2016-09-03 – 2016-09-04 (×6): 2.5 mg via INTRAVENOUS
  Filled 2016-09-03 (×6): qty 5

## 2016-09-03 NOTE — Progress Notes (Signed)
Pt HR maintaining 120-130. Pt still on Bipap. O2 saturations >94%. RR <22. Notified MD. New orders placed. PRN medication administered. Will continue to monitor.

## 2016-09-03 NOTE — Progress Notes (Signed)
[name], patient's granddaughter called RN to inform RN that patient had requested to have his mouth swabbed and moisturizer on his lips.  RN at bedside and had [name] explain to patient that RN was there to perform oral care and had to remove mask for a minute in order to complete oral care.  Patient started shaking head no and and spoke to [place] in [place].  [name] told RN that patient did not want mask removed at this time.  Will continue to monitor.

## 2016-09-03 NOTE — Plan of Care (Signed)
Problem: Physical Regulation: Goal: Ability to maintain clinical measurements within normal limits will improve Outcome: Not Progressing Pt not tolerating HFNC. Only saturating 80-88%. Switched to Bipap w/ Improvement of SpO2 >94%. Tachycardic throughout night. PRN metoprolol  Administered. PRN medication administered for anxiety and discomfort w/ some relief. Pt unable to tolerate any activity. Oral and skin care performed. Pt refusing turns. Educated pt and family on plan of care and need for turns Q 2 hours. Pt had some episodes of incontinence. Condom cath applied.  Problem: Skin Integrity: Goal: Risk for impaired skin integrity will decrease Outcome: Not Progressing Pt refusing turns and repositioning. Making very minimal movements in bed. Educated multiple times. Administered PRN medications prior to any turns or skin care. Performed bed bath and changed linin. Foam sacral pad and heel pads appliede. Moister barrier cream applied. No s/s of breakdown noted. Pt is at very high risk due to in mobility, unwillingness to reposition, and frail skin.   Problem: Fluid Volume: Goal: Ability to maintain a balanced intake and output will improve Outcome: Progressing Pt not tolerating much oral intake. Requiring Bipap. Oral care provided.

## 2016-09-03 NOTE — Progress Notes (Signed)
Pharmacy Antibiotic Note  [name] is a 71 y.o. male admitted on 08/09/2016 with pneumonia.  Pharmacy has been consulted for Cefepime dosing.  I d/w Dr Quincy Simmonds and he does not wish to add MRSA coverage as his screening was negative.  Scr 0.7, CrCl ~65 ml/min Fever resolved, continues to be hypoxic with hi flow [place] and intermittent BiPAP.  Plan: Continue cefepime 2g IV q8 Watch renal fxn and adjust dosing as needed  Height: _0  (162.6 cm) Weight:  (bed not weighting correctly unable to get) IBW/kg (Calculated) : 59.2  Temp (24hrs), Avg:97.7 F (36.5 C), Min:97.4 F (36.3 C), Max:98.2 F (36.8 C)   Recent Labs Lab 08/29/16 0442 08/31/16 0827 09/01/16 0232 09/03/16 0209  WBC  --  19.2* 21.7* 22.4*  CREATININE 0.83 0.73 0.91 0.70    Estimated Creatinine Clearance: 65.6 mL/min (by C-G formula based on SCr of 0.7 mg/dL).    Allergies  Allergen Reactions  . Cyclobenzaprine Swelling and Other (See Comments)    Tongue and lips swell    Antimicrobials this admission: 8/9 Cefepime >>  Dose adjustments this admission:   Microbiology results:  8/9 Sputum: sent  8/2 MRSA PCR: neg 7/31 Resp viral panel neg  Thank you for allowing pharmacy to be a part of this patient's care.  Angus Seller, PharmD Pharmacy Resident Pager: [phone] 09/03/2016 12:48 PM

## 2016-09-03 NOTE — Progress Notes (Addendum)
PROGRESS NOTE    [name]   HAF:[id number]  DOB: Dec 18, 1945  DOA: 08/03/2016 PCP: Ladell Pier, MD   Brief Narrative:  [name] 71 year old Spanish-speaking male with past medical history of pulmonary fibrosis/ILD started  [name] 6/5. 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  Not a candidate for lung transplant.  On 4 L O2 at baseline. Admitted from 7/10 -7/17 with acute on chronic hypoxic resp failure. Treated with antibiotics in hospital and discharged with Prednisone. Presented on 7/31 for dyspnea again though to be due to exacerbation of ILD. CT chest confirmed disease progression. Pulmonary recommended transfer to tertiary facility for second opinion but both [place] felt nothing to offer as inpatient.  8/2 palliative care consulted.  8/8 respiratory distress increases- fever 101- CXR showed now LLL infiltrate- started on antibiotics and BiPAP. Palliative care discussion underway in regards to hospice. On antibiotics, intermittent BiPAP and Morphine infusion at the same time.     Subjective: On BiPAP. States he feels well and is not short of breath.  ROS: no complaints of nausea, vomiting, constipation diarrhea, cough, dyspnea or dysuria. No other complaints.   Assessment & Plan:   Principal Problem:   Acute on chronic respiratory failure   IPF (idiopathic pulmonary fibrosis) - see above- palliative assisting to long term plans - Solumedrol 60 mg IV Q12, Nebs  HCAP- LLL pneumonia - 8/8 - fever resolved- continues to be quite hypoxic and on hi flow [place] and intermittently on BiPAP - Cefepime - Morphine drip to help with respiratory distress as well  Anxiety - Ativan PRN    HTN (hypertension)  - Cardizem CD  Sinus tachycardia - likely due to resp distress- PRN Metoprolol for HR > 115    Diabetes mellitus   - SSI  Thrush - Mycelex   DVT prophylaxis: Lovenox Code Status:  DNR Family Communication:  Disposition Plan: to be determined- hospice facility appears to be most appropriate if patient agrees Consultants:   PCCM  Palliative Procedures:    Antimicrobials:  Anti-infectives    Start     Dose/Rate Route Frequency Ordered Stop   08/31/16 1045  ceFEPIme (MAXIPIME) 2 g in dextrose 5 % 50 mL IVPB     2 g 100 mL/hr over 30 Minutes Intravenous Every 8 hours 08/31/16 1013     08/24/16 1230  ceFEPIme (MAXIPIME) 1 g in dextrose 5 % 50 mL IVPB  Status:  Discontinued     1 g 100 mL/hr over 30 Minutes Intravenous Every 12 hours 08/24/16 1224 08/24/16 1316   08/23/16 2000  doxycycline (VIBRAMYCIN) 100 mg in dextrose 5 % 250 mL IVPB  Status:  Discontinued     100 mg 125 mL/hr over 120 Minutes Intravenous 2 times per day 08/23/16 1429 08/24/16 1316   08/19/2016 0800  doxycycline (VIBRAMYCIN) 100 mg in dextrose 5 % 250 mL IVPB  Status:  Discontinued     100 mg 125 mL/hr over 120 Minutes Intravenous Every 12 hours 08/09/2016 0610 08/23/16 1429       Objective: Vitals:   09/03/16 0532 09/03/16 0730 09/03/16 0735 09/03/16 0805  BP: (!) 157/94  (!) 137/123 (!) 144/91  Pulse: (!) 144 (!) 139 (!) 126 (!) 114  Resp: (!) 25 (!) _0 Temp:   (!) 97.5 F (36.4 C)   TempSrc:   Axillary   SpO2: 96% 97% 97% 97%  Weight:      Height:  Intake/Output Summary (Last 24 hours) at 09/03/16 0930 Last data filed at 09/03/16 0534  Gross per 24 hour  Intake              425 ml  Output              300 ml  Net              125 ml   Filed Weights   08/29/16 0443 08/30/16 0500  Weight: 52.7 kg (116 lb 3.2 oz) 53.2 kg (117 lb 3.2 oz)    Examination: General exam: Appears comfortable  HEENT: PERRLA, oral mucosa moist, no sclera icterus or thrush Respiratory system: Clear to auscultation. Chest retractions and abdominal breathing noted Cardiovascular system: S1 & S2 heard, RRR.  No murmurs  Gastrointestinal system: Abdomen soft, non-tender, nondistended.  Normal bowel sound. No organomegaly Central nervous system: Alert and oriented. No focal neurological deficits. Extremities: No cyanosis, clubbing or edema Skin: No rashes or ulcers Psychiatry:  Mood & affect appropriate.     Data Reviewed: I have personally reviewed following labs and imaging studies  CBC:  Recent Labs Lab 08/31/16 0827 09/01/16 0232 09/03/16 0209  WBC 19.2* 21.7* 22.4*  NEUTROABS 18.2*  --  21.7*  HGB 14.6 13.8 13.6  HCT 43.1 42.1 41.9  MCV 90.0 92.1 93.1  PLT 157 144* 308*   Basic Metabolic Panel:  Recent Labs Lab 08/29/16 0442 08/31/16 0827 09/01/16 0232 09/03/16 0209  NA  --  139 142 144  K  --  4.2 5.3* 4.5  CL  --  96* 100* 101  CO2  --  34* 32 35*  GLUCOSE  --  121* 172* 231*  BUN  --  22* 45* 30*  CREATININE 0.83 0.73 0.91 0.70  CALCIUM  --  8.9 8.5* 8.6*   GFR: Estimated Creatinine Clearance: 65.6 mL/min (by C-G formula based on SCr of 0.7 mg/dL). Liver Function Tests: No results for input(s): AST, ALT, ALKPHOS, BILITOT, PROT, ALBUMIN in the last 168 hours. No results for input(s): LIPASE, AMYLASE in the last 168 hours. No results for input(s): AMMONIA in the last 168 hours. Coagulation Profile: No results for input(s): INR, PROTIME in the last 168 hours. Cardiac Enzymes:  Recent Labs Lab 08/31/16 0959  TROPONINI <0.03   BNP (last 3 results) No results for input(s): PROBNP in the last 8760 hours. HbA1C: No results for input(s): HGBA1C in the last 72 hours. CBG:  Recent Labs Lab 09/01/16 1615 09/02/16 0829 09/02/16 1130 09/02/16 1531 09/03/16 0755  GLUCAP 256* 245* 283* 187* 260*   Lipid Profile: No results for input(s): CHOL, HDL, LDLCALC, TRIG, CHOLHDL, LDLDIRECT in the last 72 hours. Thyroid Function Tests: No results for input(s): TSH, T4TOTAL, FREET4, T3FREE, THYROIDAB in the last 72 hours. Anemia Panel: No results for input(s): VITAMINB12, FOLATE, FERRITIN, TIBC, IRON, RETICCTPCT in the last 72 hours. Urine  analysis:    Component Value Date/Time   COLORURINE YELLOW 08/01/2016 1131   APPEARANCEUR CLOUDY (A) 08/01/2016 1131   LABSPEC 1.014 08/01/2016 1131   PHURINE 8.0 08/01/2016 1131   GLUCOSEU NEGATIVE 08/01/2016 [place] 08/01/2016 [place] 08/01/2016 1131   KETONESUR NEGATIVE 08/01/2016 1131   PROTEINUR NEGATIVE 08/01/2016 1131   NITRITE NEGATIVE 08/01/2016 1131   LEUKOCYTESUR NEGATIVE 08/01/2016 1131   Sepsis Labs: _0 (procalcitonin:4,lacticidven:4) ) Recent Results (from the past 240 hour(s))  MRSA PCR Screening     Status: None   Collection Time: 08/24/16  2:13  PM  Result Value Ref Range Status   MRSA by PCR NEGATIVE NEGATIVE Final    Comment:        The GeneXpert MRSA Assay (FDA approved for NASAL specimens only), is one component of a comprehensive MRSA colonization surveillance program. It is not intended to diagnose MRSA infection nor to guide or monitor treatment for MRSA infections.          Radiology Studies: No results found.    Scheduled Meds: . arformoterol  15 mcg Nebulization BID  . budesonide (PULMICORT) nebulizer solution  0.5 mg Nebulization BID  . calcium-vitamin D  1 tablet Oral BID  . clotrimazole  10 mg Oral 5 X Daily  . diltiazem  180 mg Oral Daily  . enoxaparin (LOVENOX) injection  40 mg Subcutaneous Q24H  . insulin aspart  0-9 Units Subcutaneous TID WC  . methylPREDNISolone (SOLU-MEDROL) injection  60 mg Intravenous Q12H  . pantoprazole (PROTONIX) IV  40 mg Intravenous QHS  . Pirfenidone  534 mg Oral TID   Continuous Infusions: . ceFEPime (MAXIPIME) IV Stopped (09/03/16 0548)  . morphine 1 mg/hr (09/03/16 0700)     LOS: 12 days    Time spent in minutes: [place], MD [facility] Pager: [email or web address] Password [email or web address] 09/03/2016, 9:30 AM

## 2016-09-03 NOTE — Progress Notes (Signed)
Staffed with Dr. Wynelle Cleveland. Recommended that we wait for spanish interpretor Monday, Graciella, who has established a relationship with pt and family, to further address home with hospice on CAD pump or transfer to residential hospice. He is becoming bipap dependant now. Unable to do bipap in either hospice setting. Up titration of MS04 infusion would be helpful to wean off bipap.Dr. Wynelle Cleveland in agreement with FU by PMT with interpretor on 8/13. He continues with IV abx with WBC 22.4 despite abx Chart reviewed. He has utilized 3 PRN MS04 doses for a TDD of MS04 30 mg IV , av 1.25 mg/hr. Recommend holding dose at 34m/hr for now with 273mq30 min bolus until we can further GOC, disposition . Continue to monitor PRN's and titrate for effect. Thank you, SaRomona CurlsANP

## 2016-09-03 NOTE — Progress Notes (Signed)
Patient's granddaughter [name] asking RN if patient can be bathed at this time.  RN explained to [name] that it was probably not in patient's best interest to try and do a lot of activity that bathing patient and changing the sheets would require.  The movement would increase his heart rate and increase how hard he was breathing.  [name] stated understanding and said she just wanted to make sure patient was clean.  RN and tech were able to roll patient, no bowel movement noted, peri care provided and bed pad changed underneath patient. Once RN and tech finished with care patient heart rate and respirations did increase.  Prior to activity patient heart rate 120's and respirations 20's, mild accessory muscle use noted.  Post activity patient heart rate high 130s-140, respirations high 30's and increase in accessory muscle use noted.  RN educated [name] about his heart rate and respirations increasing.  RN also showed [name] how patient was using more of his stomach muscles for breathing after activity.

## 2016-09-03 NOTE — Progress Notes (Signed)
Pt [name] to maintain SpO2  >88% on 15L HFNC. Nurse recommended placing back on BiPAP. Pt refusing r/t discomfort. Attempted to relieve symptoms w/ PRN medication. PRN not affective. Educated pt and family on need for Bipap in order to maintain saturation. Pt and family now agreeable. Pt now on Bipap. SpO2 >94%. Medicated w/PRN medication for anxiety. Pt tolerating well. Continue to monitor.

## 2016-09-03 NOTE — Progress Notes (Signed)
Patient was placed back on Bipap due to patient desating into 70s. RT titrating BIPAP for comfort. Patient is currently tolerating BIPAP. RT will continue to monitor.

## 2016-09-03 NOTE — Progress Notes (Addendum)
Spoke to Palliative nurse practitioner and said a meeting  with family of patient to discuss further care would be appropriate for tomorrow, 21-Sep-2022. Passed on information to family. Translator, Graciela, should be present.

## 2016-09-04 DIAGNOSIS — Z515 Encounter for palliative care: Secondary | ICD-10-CM

## 2016-09-04 LAB — GLUCOSE, CAPILLARY
GLUCOSE-CAPILLARY: 234 mg/dL — AB (ref 65–99)
GLUCOSE-CAPILLARY: 187 mg/dL — AB (ref 65–99)
GLUCOSE-CAPILLARY: 164 mg/dL — AB (ref 65–99)
Glucose-Capillary: 107 mg/dL — ABNORMAL HIGH (ref 65–99)

## 2016-09-04 LAB — PROCALCITONIN: Procalcitonin: 1.75 ng/mL

## 2016-09-04 MED ORDER — LEVALBUTEROL HCL 0.63 MG/3ML IN NEBU: 0.6300 mg | INHALATION_SOLUTION | Freq: Four times a day (QID) | RESPIRATORY_TRACT | Status: DC | PRN

## 2016-09-04 MED ORDER — PANTOPRAZOLE SODIUM 40 MG PO TBEC: 40.0000 mg | DELAYED_RELEASE_TABLET | Freq: Every day | ORAL | Status: DC

## 2016-09-04 NOTE — Progress Notes (Signed)
PROGRESS NOTE    [name]   QQP:[id number]  DOB: May 12, 1945  DOA: 08/17/2016 PCP: Ladell Pier, MD   Brief Narrative:  [name] 71 year old Spanish-speaking male with past medical history of pulmonary fibrosis/ILD started  [name] 6/5. 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  Not a candidate for lung transplant.  On 4 L O2 at baseline. Admitted from 7/10 -7/17 with acute on chronic hypoxic resp failure. Treated with antibiotics in hospital and discharged with Prednisone. Presented on 7/31 for dyspnea again though to be due to exacerbation of ILD. CT chest confirmed disease progression. Pulmonary recommended transfer to tertiary facility for second opinion but both [place] felt nothing to offer as inpatient.  8/2 palliative care consulted.  8/8 respiratory distress increases- fever 101- CXR showed now LLL infiltrate- started on antibiotics and BiPAP. Palliative care discussion underway in regards to hospice. On antibiotics, intermittent BiPAP and Morphine infusion at the same time.     Subjective: On BiPAP. Not awake today. Pupils not reactive ROS: no complaints of nausea, vomiting, constipation diarrhea, cough, dyspnea or dysuria. No other complaints.   Assessment & Plan:   Principal Problem:   Acute on chronic respiratory failure   IPF (idiopathic pulmonary fibrosis) - Solumedrol 60 mg IV Q12, Nebs  HCAP- LLL pneumonia - 8/8 - fever resolved- continued to be quite hypoxic on hi flow [place] and intermittently on BiPAP- now on BiPAP and unresponsive with no pupillary reflex- RR 8-9 now, no pulses in feet - family does not want to stop anything and feel he might improve- I suspect in hospital death soon - Cefepime - Morphine drip to help with respiratory distress    Anxiety - Ativan PRN    HTN (hypertension)  - Cardizem CD- unresponsive and cannot take- currently hypotensive  Sinus tachycardia -  likely due to resp distress- PRN Metoprolol for HR > 115    Diabetes mellitus   - SSI  [place] - Mycelex was being used- cannot use it now due to BiPAP   DVT prophylaxis: Lovenox Code Status: DNR Family Communication:  Disposition Plan: to be determined- hospice facility appears to be most appropriate if patient agrees Consultants:   PCCM  Palliative Procedures:    Antimicrobials:  Anti-infectives    Start     Dose/Rate Route Frequency Ordered Stop   08/31/16 1045  ceFEPIme (MAXIPIME) 2 g in dextrose 5 % 50 mL IVPB     2 g 100 mL/hr over 30 Minutes Intravenous Every 8 hours 08/31/16 1013     08/24/16 1230  ceFEPIme (MAXIPIME) 1 g in dextrose 5 % 50 mL IVPB  Status:  Discontinued     1 g 100 mL/hr over 30 Minutes Intravenous Every 12 hours 08/24/16 1224 08/24/16 1316   08/23/16 2000  doxycycline (VIBRAMYCIN) 100 mg in dextrose 5 % 250 mL IVPB  Status:  Discontinued     100 mg 125 mL/hr over 120 Minutes Intravenous 2 times per day 08/23/16 1429 08/24/16 1316   07/27/2016 0800  doxycycline (VIBRAMYCIN) 100 mg in dextrose 5 % 250 mL IVPB  Status:  Discontinued     100 mg 125 mL/hr over 120 Minutes Intravenous Every 12 hours 08/09/2016 0610 08/23/16 1429       Objective: Vitals:   09/04/16 0442 09/04/16 0449 09/04/16 0731 09/04/16 0804  BP: 96/71  (!) 83/56   Pulse: (!) 120  (!) 118 (!) 119  Resp: 10   Marland Kitchen)  9  Temp:  98.8 F (37.1 C) 97.7 F (36.5 C)   TempSrc:  Axillary Axillary   SpO2: 93%  93% 94%  Weight:      Height:        Intake/Output Summary (Last 24 hours) at 09/04/16 1046 Last data filed at 09/04/16 0700  Gross per 24 hour  Intake              112 ml  Output             1025 ml  Net             -913 ml   Filed Weights   08/29/16 0443 08/30/16 0500  Weight: 52.7 kg (116 lb 3.2 oz) 53.2 kg (117 lb 3.2 oz)    Examination: General exam: Appears comfortable - unresponsive to touch HEENT: manually had to open his eyes as he is not opening them- pupils  non-reactive to light Respiratory system: Clear to auscultation. RR 8-9. On BiPAP Cardiovascular system: S1 & S2 heard,  No murmurs tachycardic Gastrointestinal system: Abdomen soft, non-tender, nondistended. Normal bowel sound. No organomegaly Central nervous system: unresponsive No focal neurological deficits. Extremities: No cyanosis, clubbing or edema Skin: No rashes or ulcers Psychiatry:  Talmadge Chad     Data Reviewed: I have personally reviewed following labs and imaging studies  CBC:  Recent Labs Lab 08/31/16 0827 09/01/16 0232 09/03/16 0209  WBC 19.2* 21.7* 22.4*  NEUTROABS 18.2*  --  21.7*  HGB 14.6 13.8 13.6  HCT 43.1 42.1 41.9  MCV 90.0 92.1 93.1  PLT 157 144* 323*   Basic Metabolic Panel:  Recent Labs Lab 08/29/16 0442 08/31/16 0827 09/01/16 0232 09/03/16 0209  NA  --  139 142 144  K  --  4.2 5.3* 4.5  CL  --  96* 100* 101  CO2  --  34* 32 35*  GLUCOSE  --  121* 172* 231*  BUN  --  22* 45* 30*  CREATININE 0.83 0.73 0.91 0.70  CALCIUM  --  8.9 8.5* 8.6*   GFR: Estimated Creatinine Clearance: 65.6 mL/min (by C-G formula based on SCr of 0.7 mg/dL). Liver Function Tests: No results for input(s): AST, ALT, ALKPHOS, BILITOT, PROT, ALBUMIN in the last 168 hours. No results for input(s): LIPASE, AMYLASE in the last 168 hours. No results for input(s): AMMONIA in the last 168 hours. Coagulation Profile: No results for input(s): INR, PROTIME in the last 168 hours. Cardiac Enzymes:  Recent Labs Lab 08/31/16 0959  TROPONINI <0.03   BNP (last 3 results) No results for input(s): PROBNP in the last 8760 hours. HbA1C: No results for input(s): HGBA1C in the last 72 hours. CBG:  Recent Labs Lab 09/03/16 0755 09/03/16 1142 09/03/16 1652 09/03/16 2217 09/04/16 0730  GLUCAP 260* 174* 155* 217* 234*   Lipid Profile: No results for input(s): CHOL, HDL, LDLCALC, TRIG, CHOLHDL, LDLDIRECT in the last 72 hours. Thyroid Function Tests: No results for  input(s): TSH, T4TOTAL, FREET4, T3FREE, THYROIDAB in the last 72 hours. Anemia Panel: No results for input(s): VITAMINB12, FOLATE, FERRITIN, TIBC, IRON, RETICCTPCT in the last 72 hours. Urine analysis:    Component Value Date/Time   COLORURINE YELLOW 08/01/2016 1131   APPEARANCEUR CLOUDY (A) 08/01/2016 1131   LABSPEC 1.014 08/01/2016 1131   PHURINE 8.0 08/01/2016 [place] 08/01/2016 [place] 08/01/2016 [place] 08/01/2016 [place] 08/01/2016 1131   PROTEINUR NEGATIVE 08/01/2016 1131  NITRITE NEGATIVE 08/01/2016 [place] 08/01/2016 1131   Sepsis Labs: _0 (procalcitonin:4,lacticidven:4) ) No results found for this or any previous visit (from the past 240 hour(s)).       Radiology Studies: No results found.    Scheduled Meds: . arformoterol  15 mcg Nebulization BID  . budesonide (PULMICORT) nebulizer solution  0.5 mg Nebulization BID  . calcium-vitamin D  1 tablet Oral BID  . clotrimazole  10 mg Oral 5 X Daily  . diltiazem  180 mg Oral Daily  . enoxaparin (LOVENOX) injection  40 mg Subcutaneous Q24H  . insulin aspart  0-9 Units Subcutaneous TID WC  . methylPREDNISolone (SOLU-MEDROL) injection  60 mg Intravenous Q12H  . pantoprazole  40 mg Oral QHS  . Pirfenidone  534 mg Oral TID   Continuous Infusions: . ceFEPime (MAXIPIME) IV Stopped (09/04/16 0546)  . morphine 1 mg/hr (09/03/16 0700)     LOS: 13 days    Time spent in minutes: [place], MD [facility] Pager: [email or web address] Password [facility] 09/04/2016, 10:46 AM

## 2016-09-04 NOTE — Progress Notes (Signed)
RN into check patient's vital signs.  RN picked up patient's right arm up to adjust blood pressure cuff prior to taking blood pressure.  RN performed axillary temperature.  Patient did not arouse with RN interaction.  RN did not attempt to arouse patient with painful stimuli at this time.  Patient appears to be resting comfortably.  Family member present at bedside.    RN did provide medication education to patient's family member prior to administering PRN Metoprolol.  Patient's family member verbalized understanding.

## 2016-09-04 NOTE — Progress Notes (Signed)
At last vital sign check, patient's respiratory rate at 10.  Per MD order notify if respiratory rate less than 12.  RN text paged [facility] via Amion to update.  Dr. Olevia Bowens, on call with [facility] returned RN's page, no new orders at this time.  Will continue to monitor.

## 2016-09-04 NOTE — Progress Notes (Signed)
Palliative:   Called by nursing that the family expected a family meeting with [place] at 8:00am. Family members at bedside. Pt. is not alert and unresponsive. He has bipap in place with RR of 9. His feet are cold to the touch, cyanotic.   Children and grandchildren sitting in consultation room. Hospital Spanish interpretor present in room. Family indicating they want to speak with primary team physician. Stepped out to request Dr. Wynelle Cleveland. While out of room, interpretor stepped out and informed us that family did not want to speak with [place] again, only the primary doctor. Dr. Wynelle Cleveland in to see family. Family desiring to continue current level of care. Unfortunately at this time I am not sure that palliative has much to add that family will find helpful as they are struggling with poor prognosis but not wishing to speak with [place].   Call if family requests palliative involvement.    20 min  Vinie Sill, NP Palliative Medicine Team Pager # [phone] (M-F 8a-5p) Team Phone # [phone] (Nights/Weekends)

## 2016-09-13 ENCOUNTER — Inpatient Hospital Stay: Payer: Self-pay | Admitting: Pulmonary Disease

## 2016-09-18 ENCOUNTER — Ambulatory Visit: Payer: Self-pay | Admitting: Cardiology

## 2016-09-23 NOTE — Progress Notes (Signed)
At about 0340 patient respirations have ceased and no heart beat heard, confirmed per 2 RN's.  Patient has a family member present at bedside.  RN asked family member if he wanted RN to call anyone and family member stated he would make the calls.  [facility] text paged via [place].

## 2016-09-23 NOTE — Progress Notes (Signed)
Patient family member that was present at bedside was patient's son Mel Almond.

## 2016-09-23 NOTE — Death Summary Note (Signed)
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 year old male  with past medical history of pulmonary fibrosis/ILD started  [name] 6/5. 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  Not a candidate for lung transplant.  On 4 L O2 at baseline. Admitted from 7/10 -7/17 with acute on chronic hypoxic resp failure. Treated with antibiotics in hospital and discharged with Prednisone. Presented on Sep 11, 2022 for dyspnea again though to be due to exacerbation of ILD. CT chest confirmed disease progression. Pulmonary recommended transfer to tertiary facility for second opinion but both [place] felt nothing to offer as inpatient.  8/2 palliative care consulted.  8/8 respiratory distress increases- fever 101- CXR showed now LLL infiltrate- started on antibiotics and BiPAP. Palliative care discussion underway in regards to hospice. On antibiotics, intermittent BiPAP and Morphine infusion at the same time.  on 8/13 was noted to be unresponsive with no pupillary response. Discussed comfort care with all of his children. Family declined comfort care. He expired  at date and time mentioned above.      Pertinent Labs and Studies  Significant Diagnostic Studies Dg Chest 2 View  Result Date: 08/21/2016 CLINICAL DATA:  Worsening shortness of breath with chest tightness, productive cough and palpitations. EXAM: CHEST  2 VIEW COMPARISON:  None. FINDINGS: Stable marked prominence of the interstitial markings with diffuse honeycombing. The cardiac silhouette remains borderline enlarged. Biapical pleural thickening is unchanged. Lower thoracic and upper lumbar spine degenerative changes. L1 vertebral compression deformity without significant change since 05/13/2016. No acute fractures. IMPRESSION: No acute abnormality. Stable marked chronic interstitial lung disease. Electronically Signed   By: Claudie Revering M.D.   On: 08/21/2016 21:45   Ct Chest High Resolution  Result Date: 08/23/2016 CLINICAL DATA:  71 year old male with shortness of breath. Evaluate for interstitial lung disease. EXAM: CT CHEST WITHOUT CONTRAST TECHNIQUE: Multidetector CT imaging of the chest was performed following the standard protocol without intravenous contrast. High resolution imaging of the lungs, as well as inspiratory and expiratory imaging, was performed. COMPARISON:  Chest CT 05/13/2016. FINDINGS: Comment: Study is limited by considerable patient respiratory motion. Cardiovascular: Heart size is borderline enlarged. There is no significant pericardial fluid, thickening or pericardial calcification. Aortic atherosclerosis. No definite coronary artery calcifications are identified. Mediastinum/Nodes: Several borderline enlarged and mildly enlarged mediastinal and hilar lymph nodes are noted measuring up to 1 cm in short axis in the subcarinal nodal station. Esophagus is unremarkable in appearance. No axillary lymphadenopathy. Lungs/Pleura: High-resolution images demonstrate diffuse septal thickening, subpleural reticulation, extensive cylindrical and varicose bronchiectasis, rather diffuse  thickening of the peribronchovascular interstitium and scattered areas of parenchymal banding. These findings have a definitive craniocaudal gradient, and  have significantly worsened compared to prior study from 05/13/2016. Inspiratory and expiratory images are considered nondiagnostic due to patient motion and respiration. There is also a background of moderate centrilobular and paraseptal emphysema. No acute consolidative airspace disease. No pleural effusions. Upper Abdomen: Unremarkable. Musculoskeletal: There are no aggressive appearing lytic or blastic lesions noted in the visualized portions of the skeleton. IMPRESSION: 1. Progressive pulmonary fibrosis, as detailed above, considered a definite usual interstitial pneumonia (UIP) pattern. 2. There is also moderate centrilobular and paraseptal emphysema. 3. Aortic atherosclerosis. Aortic Atherosclerosis (ICD10-I70.0) and Emphysema (ICD10-J43.9). Electronically Signed   By: Vinnie Langton M.D.   On: 08/23/2016 16:31   Dg Chest Port 1 View  Result Date: 08/31/2016 CLINICAL DATA:  Worsening shortness of breath. EXAM: PORTABLE CHEST 1 VIEW COMPARISON:  08/27/2016, 05/03/2016 and CT 05/13/2016 FINDINGS: Exam demonstrates low lung volumes with diffuse bilateral chronic interstitial disease compatible with known pulmonary fibrosis. Possible mixed interstitial airspace density in the left base. No definite effusion. Cardiomediastinal silhouette and remainder of the exam is unchanged. IMPRESSION: Possible mixed interstitial airspace density in the left base as cannot exclude atelectasis or infection. Stable chronic interstitial changes compatible with known pulmonary fibrosis. Electronically Signed   By: Marin Olp M.D.   On: 08/31/2016 08:53   Dg Chest Port 1 View  Result Date: 08/27/2016 CLINICAL DATA:  SOB, hx of IPF, pulmonary hypertension EXAM: PORTABLE CHEST 1 VIEW COMPARISON:  08/23/2016, 08/21/2016 FINDINGS: Shallow lung inflation. Heart size is  accentuated by the portable AP position of the patient. Persistent fibrotic changes are identified throughout the lungs. No new consolidations or evidence for pulmonary edema. IMPRESSION: Stable appearance of pulmonary fibrosis. Electronically Signed   By: Nolon Nations M.D.   On: 08/27/2016 14:18    Microbiology No results found for this or any previous visit (from the past 240 hour(s)).  Lab Basic Metabolic Panel:  Recent Labs Lab 08/31/16 0827 09/01/16 0232 09/03/16 0209  NA 139 142 144  K 4.2 5.3* 4.5  CL 96* 100* 101  CO2 34* 32 35*  GLUCOSE 121* 172* 231*  BUN 22* 45* 30*  CREATININE 0.73 0.91 0.70  CALCIUM 8.9 8.5* 8.6*   Liver Function Tests: No results for input(s): AST, ALT, ALKPHOS, BILITOT, PROT, ALBUMIN in the last 168 hours. No results for input(s): LIPASE, AMYLASE in the last 168 hours. No results for input(s): AMMONIA in the last 168 hours. CBC:  Recent Labs Lab 08/31/16 0827 09/01/16 0232 09/03/16 0209  WBC 19.2* 21.7* 22.4*  NEUTROABS 18.2*  --  21.7*  HGB 14.6 13.8 13.6  HCT 43.1 42.1 41.9  MCV 90.0 92.1 93.1  PLT 157 144* 144*   Cardiac Enzymes:  Recent Labs Lab 08/31/16 0959  TROPONINI <0.03   Sepsis Labs:  Recent Labs Lab 08/31/16 0827 08/31/16 0959 09/01/16 0232 09/02/16 0527 09/03/16 0209 09/04/16 0424  PROCALCITON  --  0.17  --  2.22  --  1.75  WBC 19.2*  --  21.7*  --  22.4*  --         [name] 2016-09-23, 4:39 PM

## 2016-09-23 DEATH — deceased

## 2016-10-03 ENCOUNTER — Ambulatory Visit: Payer: Self-pay | Admitting: Internal Medicine

## 2018-06-23 IMAGING — DX DG CHEST 2V
2 series · 2 of 2 positions shown · non-contrast
Comparison: None.

CLINICAL DATA: Cough

EXAM:
CHEST  2 VIEW

[w chest pa]
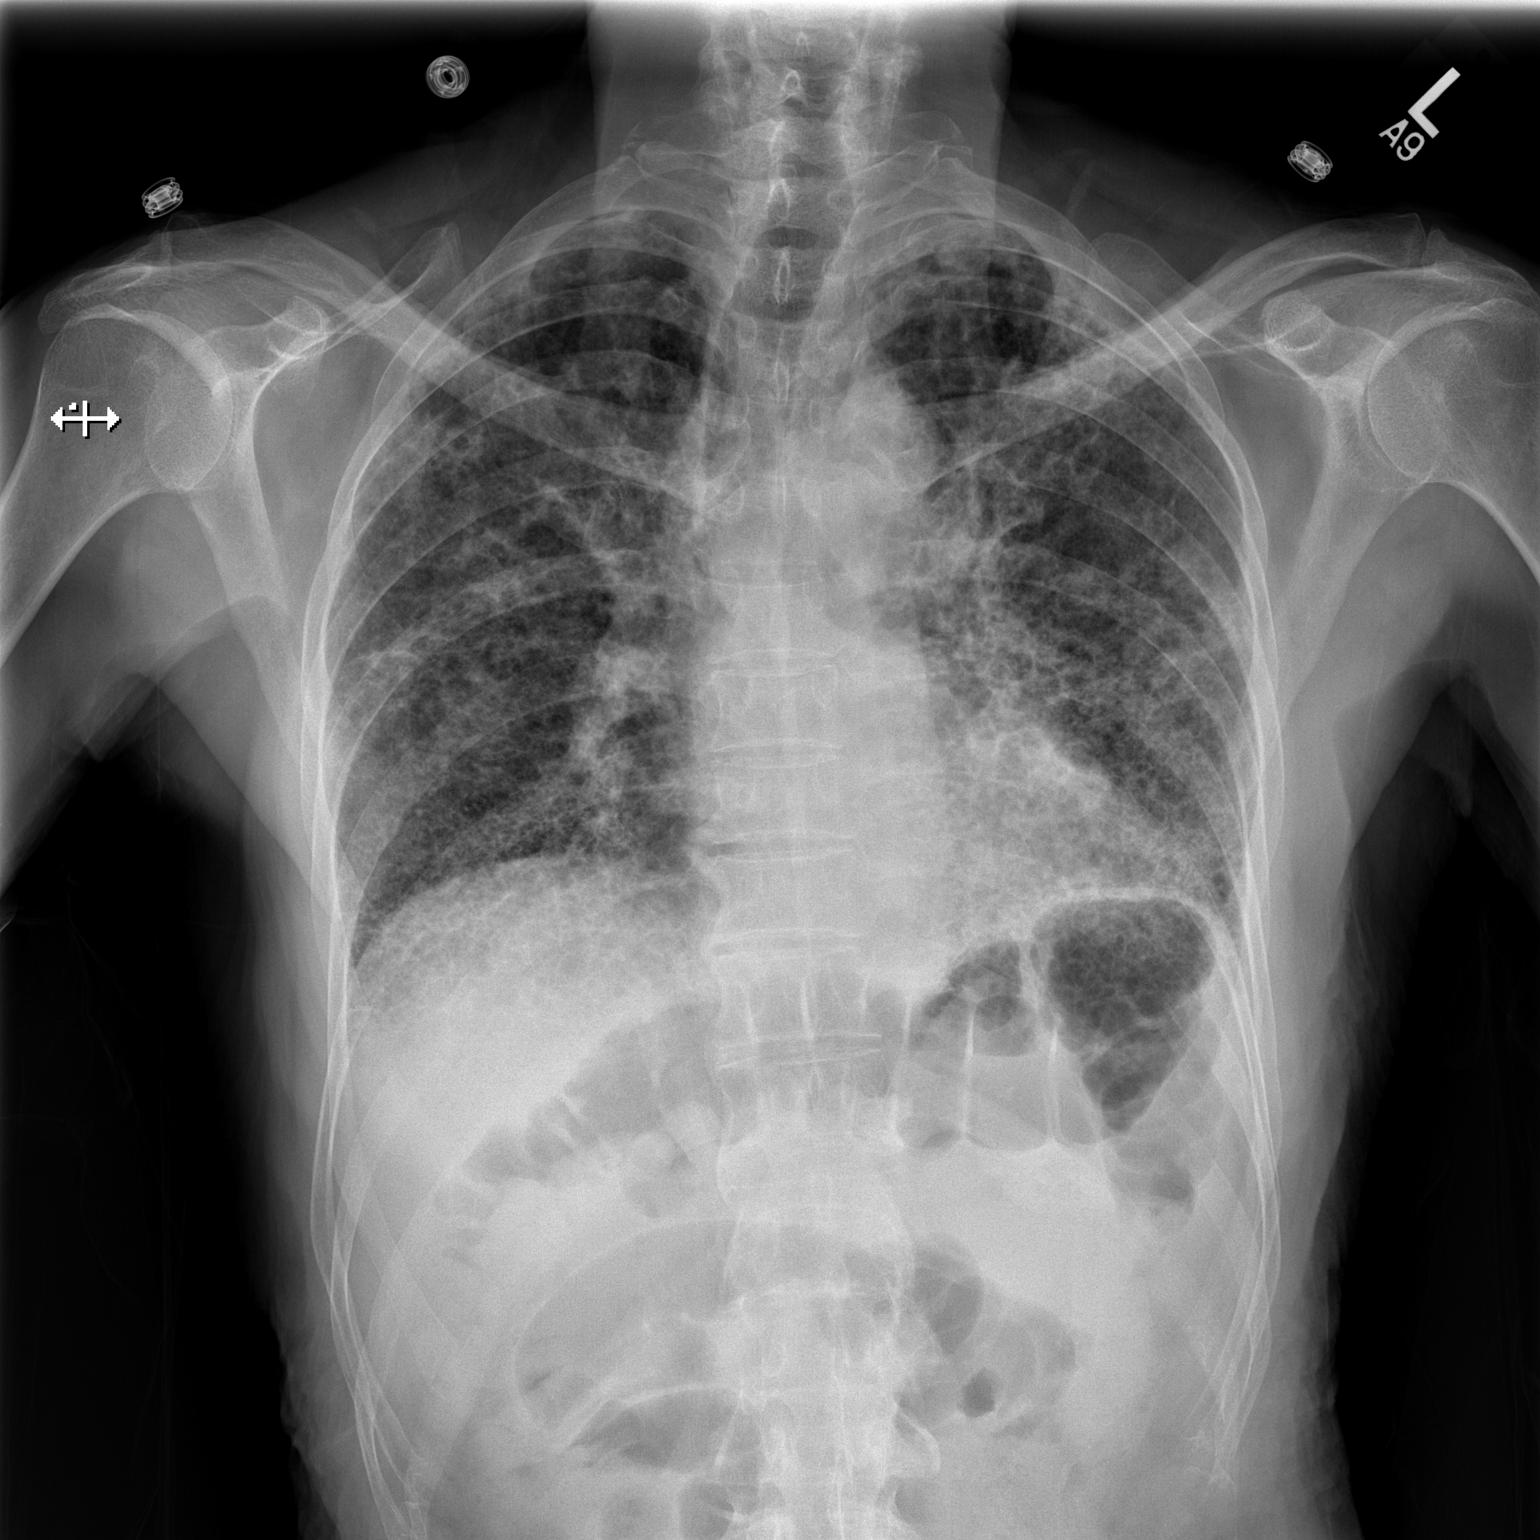

[w chest lat]
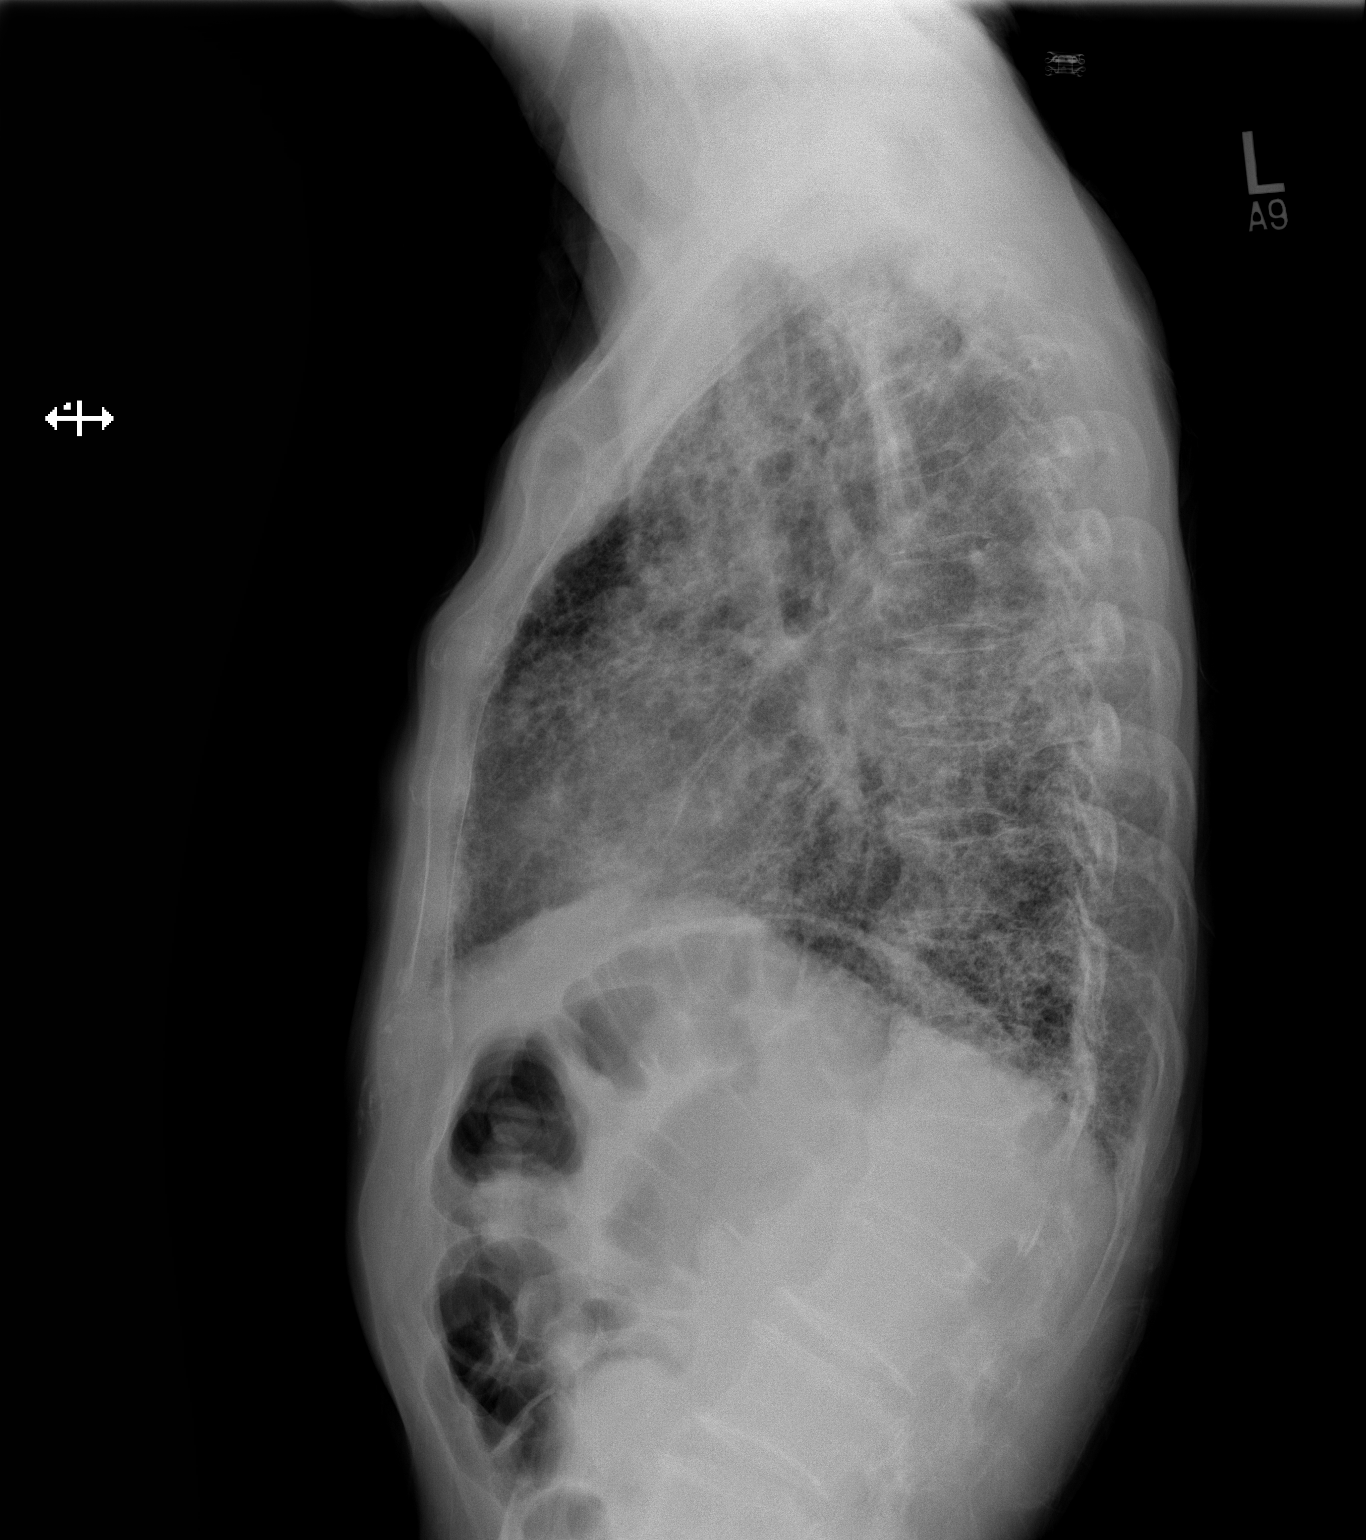

[2 of 2 positions shown; findings below may reference images not displayed]

FINDINGS: Chronic interstitial markings markings/fibrosis with bronchiectasis
and subpleural reticulation in the lungs bilaterally. While
nonspecific, idiopathic pulmonary fibrosis is possible. No
superimposed opacities suspicious for pneumonia.

No pleural effusion or pneumothorax.

The heart is normal in size.

Mild degenerative changes of the visualized thoracolumbar spine.
Mild lower thoracic kyphotic deformity with associated mild superior
endplate changes with loss of height at L1.
IMPRESSION: Chronic interstitial markings/fibrosis, nonspecific, but suggesting
chronic interstitial lung disease such as idiopathic pulmonary
fibrosis.

No superimposed opacities suspicious for pneumonia.

## 2018-10-11 IMAGING — DX DG CHEST 2V
2 series · 2 of 2 positions shown · non-contrast
Comparison: None.

CLINICAL DATA: Worsening shortness of breath with chest tightness,
productive cough and palpitations.

EXAM:
CHEST  2 VIEW

[w chest pa]
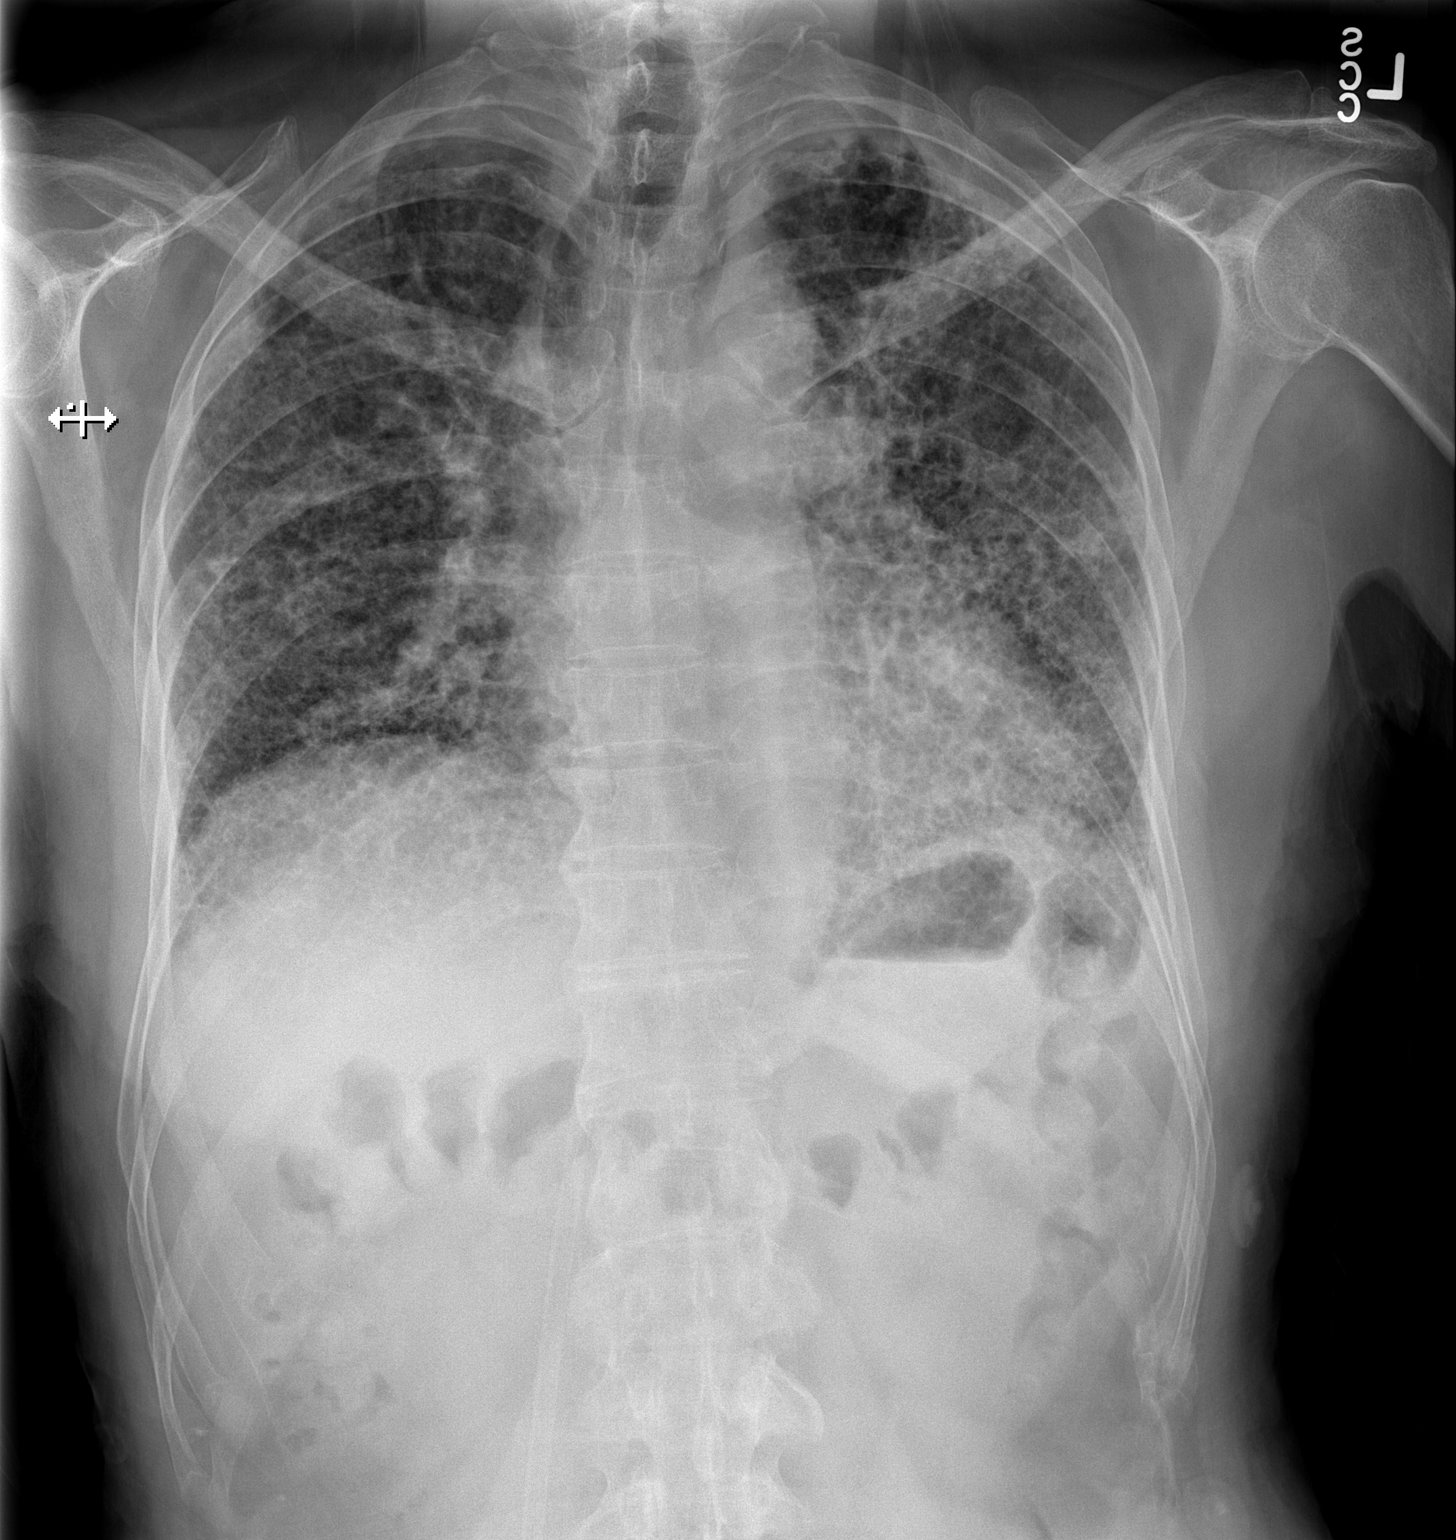

[w chest lat]
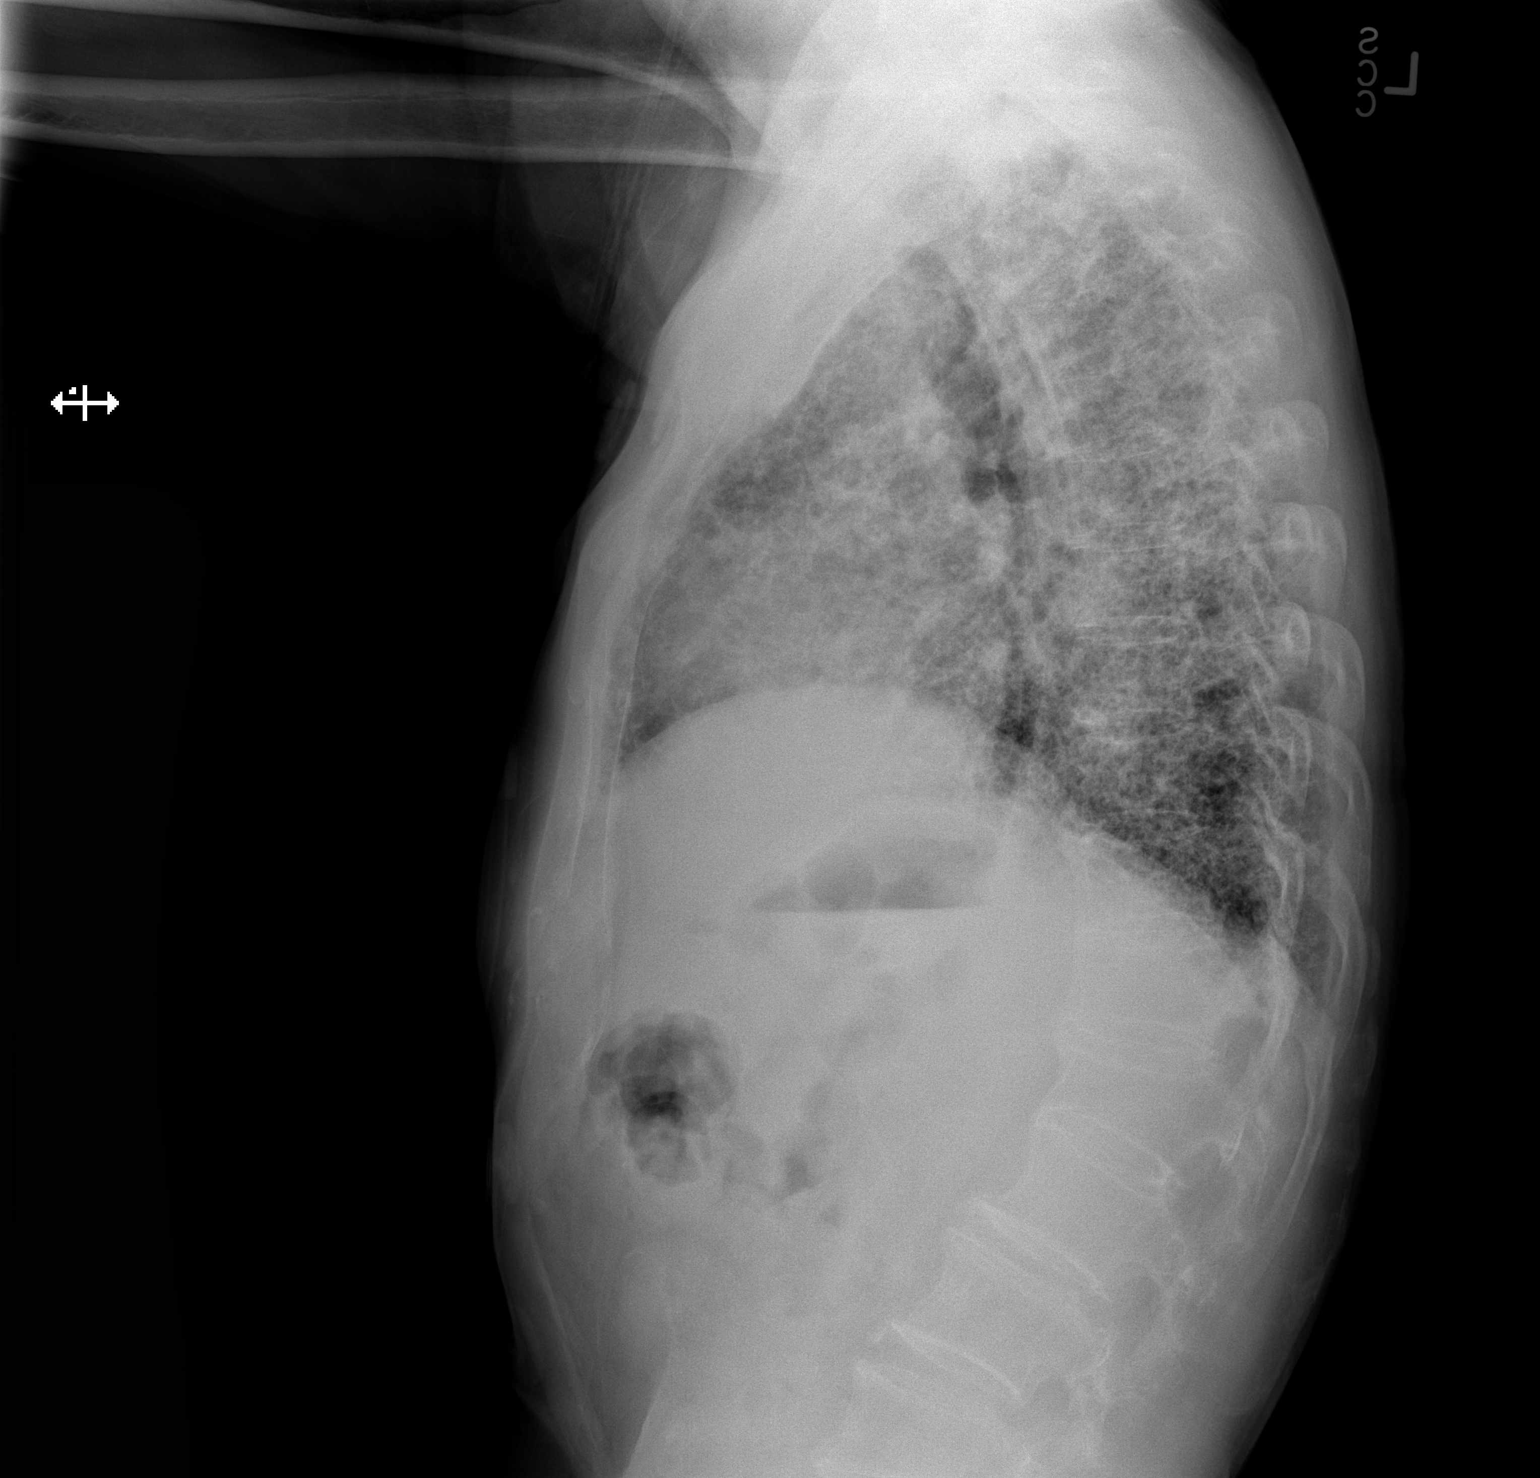

[2 of 2 positions shown; findings below may reference images not displayed]

FINDINGS: Stable marked prominence of the interstitial markings with diffuse
honeycombing. The cardiac silhouette remains borderline enlarged.
Biapical pleural thickening is unchanged. Lower thoracic and upper
lumbar spine degenerative changes. L1 vertebral compression
deformity without significant change since 05/13/2016. No acute
fractures.
IMPRESSION: No acute abnormality. Stable marked chronic interstitial lung
disease.
